# Patient Record
Sex: Male | Born: 1970 | Race: White | Hispanic: No | Marital: Married | Smoking: Current every day smoker
Health system: Southern US, Community
[De-identification: ages and names within clinical notes are randomized; demographics above are authoritative.]

## PROBLEM LIST (undated history)

## (undated) ENCOUNTER — Emergency Department (HOSPITAL_COMMUNITY): Discharge status: Against Medical Advice | Payer: Self-pay

## (undated) ENCOUNTER — Emergency Department (HOSPITAL_COMMUNITY): Disposition: A | Discharge status: Home / Self Care | Payer: Self-pay

## (undated) DIAGNOSIS — G629 Polyneuropathy, unspecified: Secondary | ICD-10-CM

## (undated) DIAGNOSIS — D649 Anemia, unspecified: Secondary | ICD-10-CM

## (undated) DIAGNOSIS — M1990 Unspecified osteoarthritis, unspecified site: Secondary | ICD-10-CM

## (undated) DIAGNOSIS — J189 Pneumonia, unspecified organism: Secondary | ICD-10-CM

## (undated) DIAGNOSIS — N419 Inflammatory disease of prostate, unspecified: Secondary | ICD-10-CM

## (undated) DIAGNOSIS — K219 Gastro-esophageal reflux disease without esophagitis: Secondary | ICD-10-CM

## (undated) HISTORY — PX: SHOULDER SURGERY: SHX246

## (undated) HISTORY — PX: TOE AMPUTATION: SHX809

---

## 1999-09-01 ENCOUNTER — Ambulatory Visit (HOSPITAL_COMMUNITY): Admission: RE | Admit: 1999-09-01 | Discharge: 1999-09-01 | Payer: Self-pay | Admitting: *Deleted

## 1999-09-01 ENCOUNTER — Encounter: Payer: Self-pay | Admitting: *Deleted

## 2009-08-12 ENCOUNTER — Emergency Department (HOSPITAL_COMMUNITY): Admission: EM | Admit: 2009-08-12 | Discharge: 2009-08-12 | Payer: Self-pay | Admitting: Emergency Medicine

## 2010-04-15 ENCOUNTER — Ambulatory Visit: Payer: Self-pay | Admitting: Diagnostic Radiology

## 2010-04-18 ENCOUNTER — Inpatient Hospital Stay (HOSPITAL_COMMUNITY): Admission: EM | Admit: 2010-04-18 | Discharge: 2010-04-22 | Payer: Self-pay | Admitting: Emergency Medicine

## 2010-04-20 ENCOUNTER — Ambulatory Visit: Payer: Self-pay | Admitting: Infectious Diseases

## 2010-11-24 ENCOUNTER — Emergency Department (HOSPITAL_BASED_OUTPATIENT_CLINIC_OR_DEPARTMENT_OTHER): Admission: EM | Admit: 2010-11-24 | Discharge: 2010-04-15 | Payer: Self-pay | Admitting: Emergency Medicine

## 2010-11-24 ENCOUNTER — Emergency Department (HOSPITAL_COMMUNITY): Admission: EM | Admit: 2010-11-24 | Discharge: 2010-07-10 | Payer: Self-pay | Admitting: Emergency Medicine

## 2011-03-03 ENCOUNTER — Emergency Department (HOSPITAL_COMMUNITY)
Admission: EM | Admit: 2011-03-03 | Discharge: 2011-03-03 | Discharge status: Against Medical Advice | Payer: Self-pay | Attending: Emergency Medicine | Admitting: Emergency Medicine

## 2011-03-03 DIAGNOSIS — L03019 Cellulitis of unspecified finger: Secondary | ICD-10-CM | POA: Insufficient documentation

## 2011-03-03 DIAGNOSIS — E789 Disorder of lipoprotein metabolism, unspecified: Secondary | ICD-10-CM | POA: Insufficient documentation

## 2011-03-03 DIAGNOSIS — E119 Type 2 diabetes mellitus without complications: Secondary | ICD-10-CM | POA: Insufficient documentation

## 2011-03-03 DIAGNOSIS — L02519 Cutaneous abscess of unspecified hand: Secondary | ICD-10-CM | POA: Insufficient documentation

## 2011-03-03 DIAGNOSIS — L989 Disorder of the skin and subcutaneous tissue, unspecified: Secondary | ICD-10-CM | POA: Insufficient documentation

## 2011-03-07 LAB — COMPREHENSIVE METABOLIC PANEL
ALT: 17 U/L (ref 0–53)
AST: 17 U/L (ref 0–37)
Albumin: 3 g/dL — ABNORMAL LOW (ref 3.5–5.2)
Alkaline Phosphatase: 84 U/L (ref 39–117)
BUN: 9 mg/dL (ref 6–23)
CO2: 25 mEq/L (ref 19–32)
CO2: 26 mEq/L (ref 19–32)
CO2: 29 mEq/L (ref 19–32)
Calcium: 8.9 mg/dL (ref 8.4–10.5)
Chloride: 101 mEq/L (ref 96–112)
Chloride: 106 mEq/L (ref 96–112)
Chloride: 97 mEq/L (ref 96–112)
Creatinine, Ser: 0.75 mg/dL (ref 0.4–1.5)
GFR calc Af Amer: >60 mL/min (ref >60)
GFR calc Af Amer: >60 mL/min (ref >60)
GFR calc non Af Amer: >60 mL/min (ref >60)
GFR calc non Af Amer: >60 mL/min (ref >60)
GFR calc non Af Amer: >60 mL/min (ref >60)
Glucose, Bld: 234 mg/dL — ABNORMAL HIGH (ref 70–99)
Potassium: 3.4 mEq/L — ABNORMAL LOW (ref 3.5–5.1)
Potassium: 3.5 mEq/L (ref 3.5–5.1)
Potassium: 3.7 mEq/L (ref 3.5–5.1)
Sodium: 131 mEq/L — ABNORMAL LOW (ref 135–145)
Sodium: 136 mEq/L (ref 135–145)
Total Bilirubin: 0.6 mg/dL (ref 0.3–1.2)
Total Bilirubin: 0.6 mg/dL (ref 0.3–1.2)
Total Protein: 7.8 g/dL (ref 6.0–8.3)

## 2011-03-07 LAB — CBC
HCT: 41.6 % (ref 39.0–52.0)
HCT: 47.3 % (ref 39.0–52.0)
Hemoglobin: 14.5 g/dL (ref 13.0–17.0)
Hemoglobin: 16.6 g/dL (ref 13.0–17.0)
MCHC: 35.2 g/dL (ref 30.0–36.0)
MCV: 89 fL (ref 78.0–100.0)
MCV: 89.9 fL (ref 78.0–100.0)
Platelets: 249 10*3/uL (ref 150–400)
Platelets: 254 10*3/uL (ref 150–400)
RBC: 4.62 MIL/uL (ref 4.22–5.81)
RBC: 5.16 MIL/uL (ref 4.22–5.81)
RDW: 11.7 % (ref 11.5–15.5)
WBC: 14.4 10*3/uL — ABNORMAL HIGH (ref 4.0–10.5)
WBC: 15.9 10*3/uL — ABNORMAL HIGH (ref 4.0–10.5)
WBC: 27.9 10*3/uL — ABNORMAL HIGH (ref 4.0–10.5)

## 2011-03-07 LAB — PATHOLOGIST SMEAR REVIEW

## 2011-03-07 LAB — DIFFERENTIAL
Basophils Absolute: 0.1 10*3/uL (ref 0.0–0.1)
Basophils Absolute: 0.1 10*3/uL (ref 0.0–0.1)
Basophils Relative: 1 % (ref 0–1)
Basophils Relative: 1 % (ref 0–1)
Basophils Relative: 1 % (ref 0–1)
Eosinophils Absolute: 0.3 10*3/uL (ref 0.0–0.7)
Eosinophils Absolute: 0.3 10*3/uL (ref 0.0–0.7)
Eosinophils Relative: 2 % (ref 0–5)
Eosinophils Relative: 2 % (ref 0–5)
Lymphocytes Relative: 15 % (ref 12–46)
Lymphocytes Relative: 28 % (ref 12–46)
Monocytes Absolute: 1.1 10*3/uL — ABNORMAL HIGH (ref 0.1–1.0)
Monocytes Absolute: 1.3 10*3/uL — ABNORMAL HIGH (ref 0.1–1.0)
Monocytes Absolute: 1.6 10*3/uL — ABNORMAL HIGH (ref 0.1–1.0)
Neutro Abs: 9.1 10*3/uL — ABNORMAL HIGH (ref 1.7–7.7)
Neutro Abs: 26.1 10*3/uL — ABNORMAL HIGH (ref 1.7–7.7)
Neutrophils Relative %: 61 % (ref 43–77)
Neutrophils Relative %: 63 % (ref 43–77)

## 2011-03-07 LAB — ANAEROBIC CULTURE: Gram Stain: NONE SEEN

## 2011-03-07 LAB — GLUCOSE, CAPILLARY
Glucose-Capillary: 171 mg/dL — ABNORMAL HIGH (ref 70–99)
Glucose-Capillary: 210 mg/dL — ABNORMAL HIGH (ref 70–99)
Glucose-Capillary: 222 mg/dL — ABNORMAL HIGH (ref 70–99)
Glucose-Capillary: 230 mg/dL — ABNORMAL HIGH (ref 70–99)
Glucose-Capillary: 230 mg/dL — ABNORMAL HIGH (ref 70–99)
Glucose-Capillary: 255 mg/dL — ABNORMAL HIGH (ref 70–99)
Glucose-Capillary: 271 mg/dL — ABNORMAL HIGH (ref 70–99)
Glucose-Capillary: 281 mg/dL — ABNORMAL HIGH (ref 70–99)
Glucose-Capillary: 490 mg/dL — ABNORMAL HIGH (ref 70–99)

## 2011-03-07 LAB — BASIC METABOLIC PANEL
BUN: 11 mg/dL (ref 6–23)
CO2: 26 mEq/L (ref 19–32)
Calcium: 8.6 mg/dL (ref 8.4–10.5)
Chloride: 101 mEq/L (ref 96–112)
Creatinine, Ser: 0.7 mg/dL (ref 0.4–1.5)
GFR calc Af Amer: >60 mL/min (ref >60)
GFR calc non Af Amer: >60 mL/min (ref >60)
Glucose, Bld: 242 mg/dL — ABNORMAL HIGH (ref 70–99)
Potassium: 4.1 mEq/L (ref 3.5–5.1)
Sodium: 138 mEq/L (ref 135–145)

## 2011-03-07 LAB — CULTURE, ROUTINE-ABSCESS

## 2011-03-07 LAB — CK TOTAL AND CKMB (NOT AT ARMC): CK, MB: 0.9 ng/mL (ref 0.3–4.0)

## 2011-03-07 LAB — CULTURE, BLOOD (ROUTINE X 2): Culture: NO GROWTH

## 2011-03-07 LAB — URINALYSIS, ROUTINE W REFLEX MICROSCOPIC
Leukocytes, UA: NEGATIVE
Protein, ur: NEGATIVE mg/dL
Specific Gravity, Urine: 1.028 (ref 1.005–1.030)
Urobilinogen, UA: 0.2 mg/dL (ref 0.0–1.0)

## 2011-03-07 LAB — TISSUE CULTURE

## 2011-03-07 LAB — C-REACTIVE PROTEIN: CRP: 6.2 mg/dL — ABNORMAL HIGH (ref <0.6)

## 2011-03-07 LAB — HEMOGLOBIN A1C: Mean Plasma Glucose: 301 mg/dL — ABNORMAL HIGH (ref <117)

## 2011-03-07 LAB — SEDIMENTATION RATE
Sed Rate: 30 mm/hr — ABNORMAL HIGH (ref 0–16)
Sed Rate: 37 mm/hr — ABNORMAL HIGH (ref 0–16)

## 2011-03-07 LAB — TROPONIN I: Troponin I: 0.01 ng/mL (ref 0.00–0.06)

## 2011-03-07 LAB — URINE MICROSCOPIC-ADD ON

## 2011-03-25 LAB — CULTURE, BLOOD (ROUTINE X 2)
Culture: NO GROWTH
Culture: NO GROWTH

## 2011-03-25 LAB — COMPREHENSIVE METABOLIC PANEL
ALT: 17 U/L (ref 0–53)
CO2: 28 mEq/L (ref 19–32)
Calcium: 9.1 mg/dL (ref 8.4–10.5)
Chloride: 96 mEq/L (ref 96–112)
GFR calc non Af Amer: >60 mL/min (ref >60)
Glucose, Bld: 276 mg/dL — ABNORMAL HIGH (ref 70–99)
Sodium: 135 mEq/L (ref 135–145)
Total Bilirubin: 0.6 mg/dL (ref 0.3–1.2)

## 2011-03-25 LAB — DIFFERENTIAL
Basophils Absolute: 0.1 10*3/uL (ref 0.0–0.1)
Basophils Relative: 1 % (ref 0–1)
Eosinophils Relative: 3 % (ref 0–5)
Monocytes Absolute: 1.2 10*3/uL — ABNORMAL HIGH (ref 0.1–1.0)

## 2011-03-25 LAB — CBC
Hemoglobin: 16 g/dL (ref 13.0–17.0)
MCHC: 34.9 g/dL (ref 30.0–36.0)
MCV: 88.2 fL (ref 78.0–100.0)
RBC: 5.21 MIL/uL (ref 4.22–5.81)

## 2011-06-07 ENCOUNTER — Emergency Department (HOSPITAL_COMMUNITY)
Admission: EM | Admit: 2011-06-07 | Discharge: 2011-06-08 | Disposition: A | Discharge status: Home / Self Care | Payer: Self-pay | Attending: Emergency Medicine | Admitting: Emergency Medicine

## 2011-06-07 DIAGNOSIS — L03319 Cellulitis of trunk, unspecified: Secondary | ICD-10-CM | POA: Insufficient documentation

## 2011-06-07 DIAGNOSIS — E119 Type 2 diabetes mellitus without complications: Secondary | ICD-10-CM | POA: Insufficient documentation

## 2011-06-07 DIAGNOSIS — L02219 Cutaneous abscess of trunk, unspecified: Secondary | ICD-10-CM | POA: Insufficient documentation

## 2011-06-08 LAB — GLUCOSE, CAPILLARY: Glucose-Capillary: 335 mg/dL — ABNORMAL HIGH (ref 70–99)

## 2011-12-15 ENCOUNTER — Emergency Department (HOSPITAL_COMMUNITY)
Admission: EM | Admit: 2011-12-15 | Discharge: 2011-12-15 | Disposition: A | Discharge status: Home / Self Care | Payer: Self-pay | Attending: Emergency Medicine | Admitting: Emergency Medicine

## 2011-12-15 ENCOUNTER — Encounter: Payer: Self-pay | Admitting: *Deleted

## 2011-12-15 DIAGNOSIS — T148XXA Other injury of unspecified body region, initial encounter: Secondary | ICD-10-CM

## 2011-12-15 DIAGNOSIS — X58XXXA Exposure to other specified factors, initial encounter: Secondary | ICD-10-CM | POA: Insufficient documentation

## 2011-12-15 DIAGNOSIS — E119 Type 2 diabetes mellitus without complications: Secondary | ICD-10-CM | POA: Insufficient documentation

## 2011-12-15 DIAGNOSIS — IMO0002 Reserved for concepts with insufficient information to code with codable children: Secondary | ICD-10-CM | POA: Insufficient documentation

## 2011-12-15 LAB — GLUCOSE, CAPILLARY: Glucose-Capillary: 243 mg/dL — ABNORMAL HIGH (ref 70–99)

## 2011-12-15 NOTE — ED Provider Notes (Signed)
History     CSN: [id number]  Arrival date & time 12/15/11  1744   First MD Initiated Contact with Patient 12/15/11 1958      Chief Complaint  Patient presents with  . Skin Ulcer    (Consider location/radiation/quality/duration/timing/severity/associated sxs/prior treatment) The history is provided by the patient.   seen here with blister to the right side of his foot which ruptured today. Some bloody drainage that has since resolved. No fever or red streaks going up his foot. History of diabetic foot. Nothing makes her symptoms better or worse  Past Medical History  Diagnosis Date  . Diabetes mellitus     History reviewed. No pertinent past surgical history.  History reviewed. No pertinent family history.  History  Substance Use Topics  . Smoking status: Current Everyday Smoker  . Smokeless tobacco: Not on file  . Alcohol Use: No      Review of Systems  All other systems reviewed and are negative.    Allergies  Review of patient's allergies indicates no known allergies.  Home Medications  No current outpatient prescriptions on file.  BP 115/98  Pulse 74  Temp(Src) 98 F (36.7 C) (Oral)  Resp 20  SpO2 99%  Physical Exam  Nursing note and vitals reviewed. Constitutional: He is oriented to person, place, and time. He appears well-developed and well-nourished.  Non-toxic appearance.  HENT:  Head: Normocephalic and atraumatic.  Eyes: Conjunctivae are normal. Pupils are equal, round, and reactive to light.  Neck: Normal range of motion.  Cardiovascular: Normal rate.   Pulmonary/Chest: Effort normal.  Musculoskeletal:       Feet:  Neurological: He is alert and oriented to person, place, and time.  Skin: Skin is warm and dry.  Psychiatric: He has a normal mood and affect.    ED Course  Procedures (including critical care time)  Labs Reviewed  GLUCOSE, CAPILLARY - Abnormal; Notable for the following:    Glucose-Capillary 243 (*)    All other  components within normal limits  POCT CBG MONITORING   No results found.   1. Blister       MDM  Patient given wound care structures        Toy Baker, MD 12/15/11 2036

## 2011-12-15 NOTE — ED Notes (Signed)
Pt left prior to receiving discharge instructions.

## 2011-12-15 NOTE — ED Notes (Addendum)
Pt in c/o sore to outside of right foot, first noted today, also increased pain in legs, pt is a noncompliant diabetic, unsure when he checked his glucose last, states he is supposed to be on insulin

## 2012-02-03 ENCOUNTER — Encounter (HOSPITAL_COMMUNITY): Payer: Self-pay | Admitting: *Deleted

## 2012-02-03 ENCOUNTER — Emergency Department (HOSPITAL_COMMUNITY)
Admission: EM | Admit: 2012-02-03 | Discharge: 2012-02-04 | Disposition: A | Discharge status: Home / Self Care | Payer: Self-pay | Attending: Emergency Medicine | Admitting: Emergency Medicine

## 2012-02-03 ENCOUNTER — Emergency Department (HOSPITAL_COMMUNITY): Payer: Self-pay

## 2012-02-03 DIAGNOSIS — N419 Inflammatory disease of prostate, unspecified: Secondary | ICD-10-CM | POA: Insufficient documentation

## 2012-02-03 DIAGNOSIS — F17200 Nicotine dependence, unspecified, uncomplicated: Secondary | ICD-10-CM | POA: Insufficient documentation

## 2012-02-03 DIAGNOSIS — Z794 Long term (current) use of insulin: Secondary | ICD-10-CM | POA: Insufficient documentation

## 2012-02-03 DIAGNOSIS — R5381 Other malaise: Secondary | ICD-10-CM | POA: Insufficient documentation

## 2012-02-03 DIAGNOSIS — R300 Dysuria: Secondary | ICD-10-CM | POA: Insufficient documentation

## 2012-02-03 DIAGNOSIS — R5383 Other fatigue: Secondary | ICD-10-CM | POA: Insufficient documentation

## 2012-02-03 DIAGNOSIS — R1032 Left lower quadrant pain: Secondary | ICD-10-CM | POA: Insufficient documentation

## 2012-02-03 DIAGNOSIS — E119 Type 2 diabetes mellitus without complications: Secondary | ICD-10-CM | POA: Insufficient documentation

## 2012-02-03 DIAGNOSIS — R739 Hyperglycemia, unspecified: Secondary | ICD-10-CM

## 2012-02-03 LAB — COMPREHENSIVE METABOLIC PANEL
Albumin: 3.4 g/dL — ABNORMAL LOW (ref 3.5–5.2)
Alkaline Phosphatase: 140 U/L — ABNORMAL HIGH (ref 39–117)
BUN: 13 mg/dL (ref 6–23)
Calcium: 9.9 mg/dL (ref 8.4–10.5)
Creatinine, Ser: 0.68 mg/dL (ref 0.50–1.35)
GFR calc Af Amer: >90 mL/min (ref >90)
Glucose, Bld: 387 mg/dL — ABNORMAL HIGH (ref 70–99)
Potassium: 3.8 mEq/L (ref 3.5–5.1)
Total Protein: 8.4 g/dL — ABNORMAL HIGH (ref 6.0–8.3)

## 2012-02-03 LAB — GLUCOSE, CAPILLARY: Glucose-Capillary: 445 mg/dL — ABNORMAL HIGH (ref 70–99)

## 2012-02-03 LAB — LIPASE, BLOOD: Lipase: 25 U/L (ref 11–59)

## 2012-02-03 LAB — BLOOD GAS, VENOUS
Patient temperature: 98.6
TCO2: 26.3 mmol/L (ref 0–100)
pCO2, Ven: 46.9 mmHg (ref 45.0–50.0)
pH, Ven: 7.429 — ABNORMAL HIGH (ref 7.250–7.300)

## 2012-02-03 LAB — CBC
Hemoglobin: 16.4 g/dL (ref 13.0–17.0)
MCH: 31.2 pg (ref 26.0–34.0)
MCHC: 36 g/dL (ref 30.0–36.0)
MCV: 86.7 fL (ref 78.0–100.0)

## 2012-02-03 LAB — KETONES, QUALITATIVE: Acetone, Bld: NEGATIVE

## 2012-02-03 LAB — DIFFERENTIAL
Basophils Relative: 0 % (ref 0–1)
Eosinophils Absolute: 0.3 10*3/uL (ref 0.0–0.7)
Eosinophils Relative: 2 % (ref 0–5)
Monocytes Relative: 8 % (ref 3–12)
Neutrophils Relative %: 76 % (ref 43–77)

## 2012-02-03 MED ORDER — ONDANSETRON HCL 4 MG/2ML IJ SOLN
4.0000 mg | Freq: Once | INTRAMUSCULAR | Status: AC
Start: 2012-02-03 — End: 2012-02-03
  Administered 2012-02-03: 4 mg via INTRAVENOUS
  Filled 2012-02-03: qty 2

## 2012-02-03 MED ORDER — HYDROMORPHONE HCL PF 1 MG/ML IJ SOLN
1.0000 mg | Freq: Once | INTRAMUSCULAR | Status: AC
  Administered 2012-02-03: 1 mg via INTRAVENOUS
  Filled 2012-02-03: qty 1

## 2012-02-03 MED ORDER — INSULIN ASPART 100 UNIT/ML ~~LOC~~ SOLN
10.0000 [IU] | Freq: Once | SUBCUTANEOUS | Status: AC
  Administered 2012-02-04: 10 [IU] via SUBCUTANEOUS
  Filled 2012-02-03: qty 1

## 2012-02-03 MED ORDER — SODIUM CHLORIDE 0.9 % IV BOLUS (SEPSIS)
1000.0000 mL | Freq: Once | INTRAVENOUS | Status: AC
  Administered 2012-02-03: 1000 mL via INTRAVENOUS

## 2012-02-03 MED ORDER — INSULIN REGULAR HUMAN 100 UNIT/ML IJ SOLN: 10.0000 [IU] | Freq: Once | INTRAMUSCULAR | Status: DC

## 2012-02-03 MED ORDER — ONDANSETRON HCL 4 MG/2ML IJ SOLN: 4.0000 mg | Freq: Once | INTRAMUSCULAR | Status: DC

## 2012-02-03 MED ORDER — KETOROLAC TROMETHAMINE 30 MG/ML IJ SOLN
30.0000 mg | Freq: Once | INTRAMUSCULAR | Status: AC
  Administered 2012-02-03: 30 mg via INTRAVENOUS
  Filled 2012-02-03: qty 1

## 2012-02-03 MED ORDER — SODIUM CHLORIDE 0.9 % IV BOLUS (SEPSIS)
1000.0000 mL | Freq: Once | INTRAVENOUS | Status: AC
  Administered 2012-02-04 (×2): 1000 mL via INTRAVENOUS

## 2012-02-03 NOTE — ED Provider Notes (Signed)
History     CSN: [id number]  Arrival date & time 02/03/12  2128   First MD Initiated Contact with Patient 02/03/12 2207      Chief Complaint  Patient presents with  . Flank Pain  . Nausea  . Emesis    (Consider location/radiation/quality/duration/timing/severity/associated sxs/prior treatment) HPI Comments: Noncompliant diabetic presenting with left flank pain intermittently for the past 3 days associated with nausea and vomiting. He states his sugars have been over 400 today his wife states he is not taking his insulin because it makes him feel worse. She states these began swelling approximately 5 times in the past 2 years. He denies any fever. History kidney stones. His left flank and radiates to his left groin and testicle. It is associated with dysuria and hematuria. Denies any chest pain, shortness of breath, headache. He has no previous history of DKA.  The history is provided by the patient.    Past Medical History  Diagnosis Date  . Diabetes mellitus     Past Surgical History  Procedure Date  . Toe amputation     multiple toes amputated.    History reviewed. No pertinent family history.  History  Substance Use Topics  . Smoking status: Current Everyday Smoker -- 2.0 packs/day    Types: Cigarettes  . Smokeless tobacco: Not on file  . Alcohol Use: No      Review of Systems  Constitutional: Positive for fatigue. Negative for fever, activity change and appetite change.  Respiratory: Negative for cough, chest tightness and shortness of breath.   Cardiovascular: Negative for chest pain.  Gastrointestinal: Positive for nausea, vomiting and abdominal pain.  Genitourinary: Positive for dysuria, flank pain and testicular pain. Negative for hematuria.  Musculoskeletal: Positive for back pain.  Skin: Negative for rash.  Neurological: Negative for tremors and headaches.  Hematological: Negative for adenopathy.    Allergies  Review of patient's allergies indicates  no known allergies.  Home Medications   Current Outpatient Rx  Name Route Sig Dispense Refill  . INSULIN ASPART 100 UNIT/ML [place] SOLN Subcutaneous Inject 1-100 Units into the skin 3 (three) times daily before meals.      BP 122/68  Pulse 101  Temp(Src) 99.4 F (37.4 C) (Oral)  Resp 17  Ht 6\' 1"  (1.854 m)  Wt 215 lb (97.523 kg)  BMI 28.37 kg/m2  SpO2 96%  Physical Exam  Constitutional: He is oriented to person, place, and time. He appears well-developed and well-nourished. No distress.  HENT:  Head: Normocephalic and atraumatic.  Mouth/Throat: Oropharynx is clear and moist. No oropharyngeal exudate.  Eyes: Conjunctivae are normal. Pupils are equal, round, and reactive to light.  Neck: Normal range of motion. Neck supple.  Cardiovascular: Normal rate, regular rhythm and normal heart sounds.   Pulmonary/Chest: Effort normal and breath sounds normal. No respiratory distress.  Abdominal: Soft. There is tenderness.       LLQ pain with guarding.  Musculoskeletal: He exhibits tenderness.       L CVAT   Neurological: He is alert and oriented to person, place, and time. No cranial nerve deficit.  Skin: Skin is warm.    ED Course  Procedures (including critical care time)  Labs Reviewed  GLUCOSE, CAPILLARY - Abnormal; Notable for the following:    Glucose-Capillary 445 (*)    All other components within normal limits  CBC - Abnormal; Notable for the following:    WBC 21.1 (*)    All other components within normal limits  DIFFERENTIAL -  Abnormal; Notable for the following:    Neutro Abs 16.1 (*)    Monocytes Absolute 1.7 (*)    All other components within normal limits  COMPREHENSIVE METABOLIC PANEL - Abnormal; Notable for the following:    Glucose, Bld 387 (*)    Total Protein 8.4 (*)    Albumin 3.4 (*)    Alkaline Phosphatase 140 (*)    Total Bilirubin 0.2 (*)    All other components within normal limits  URINALYSIS, ROUTINE W REFLEX MICROSCOPIC - Abnormal; Notable for  the following:    Specific Gravity, Urine 1.037 (*)    Glucose, UA >1000 (*)    Hgb urine dipstick MODERATE (*)    All other components within normal limits  BLOOD GAS, VENOUS - Abnormal; Notable for the following:    pH, Ven 7.429 (*)    pO2, Ven 23.7 (*)    Bicarbonate 30.5 (*)    Acid-Base Excess 5.5 (*)    All other components within normal limits  GLUCOSE, CAPILLARY - Abnormal; Notable for the following:    Glucose-Capillary 297 (*)    All other components within normal limits  LIPASE, BLOOD  KETONES, QUALITATIVE  URINE MICROSCOPIC-ADD ON   No results found.   No diagnosis found.    MDM  Flank pain, nausea, vomiting, hyperglycemia. Rule out DKA, rule out kidney stone   Anion gap 11, no evidence of DKA.  Hyperglycemia improving with IVF and insulin.  Leukocytosis on CBC but has been elevated in the past.  CT stone study and UA pending. Signed out to Dr. Hyacinth Meeker at change of shift.    Glynn Octave, MD 02/04/12 [id number]

## 2012-02-03 NOTE — ED Notes (Signed)
Pt is stating that he has pain in his abdomen that is oriented to the left and radiates to his lower abdomen and his left testicle.  Pt is also stating concern about a wound on his right foot that is on the lateral aspect of the bottom of his foot that is known to be infected and he expresses that he has not been taking his abx as prescribed.

## 2012-02-03 NOTE — ED Notes (Signed)
Pt c/o L flank pain x 3 days, intermittently. Pt c/o n/v x 2 today. Pt c/o pain radiating to L groin.

## 2012-02-04 LAB — URINE MICROSCOPIC-ADD ON

## 2012-02-04 LAB — URINALYSIS, ROUTINE W REFLEX MICROSCOPIC
Leukocytes, UA: NEGATIVE
Nitrite: NEGATIVE
Specific Gravity, Urine: 1.037 — ABNORMAL HIGH (ref 1.005–1.030)
Urobilinogen, UA: 0.2 mg/dL (ref 0.0–1.0)
pH: 6 (ref 5.0–8.0)

## 2012-02-04 LAB — GLUCOSE, CAPILLARY
Glucose-Capillary: 267 mg/dL — ABNORMAL HIGH (ref 70–99)
Glucose-Capillary: 297 mg/dL — ABNORMAL HIGH (ref 70–99)

## 2012-02-04 MED ORDER — METFORMIN HCL 500 MG PO TABS: 500.0000 mg | ORAL_TABLET | Freq: Two times a day (BID) | ORAL | Status: DC

## 2012-02-04 MED ORDER — CIPROFLOXACIN IN D5W 400 MG/200ML IV SOLN
400.0000 mg | Freq: Once | INTRAVENOUS | Status: AC
  Administered 2012-02-04: 400 mg via INTRAVENOUS
  Filled 2012-02-04: qty 200

## 2012-02-04 MED ORDER — SODIUM CHLORIDE 0.9 % IV BOLUS (SEPSIS): 1000.0000 mL | Freq: Once | INTRAVENOUS | Status: DC

## 2012-02-04 MED ORDER — CIPROFLOXACIN HCL 500 MG PO TABS: 500.0000 mg | ORAL_TABLET | Freq: Two times a day (BID) | ORAL | Status: AC

## 2012-02-04 MED ORDER — GLYBURIDE 5 MG PO TABS: 5.0000 mg | ORAL_TABLET | Freq: Two times a day (BID) | ORAL | Status: DC

## 2012-02-04 NOTE — ED Provider Notes (Signed)
  Physical Exam  BP 122/68  Pulse 101  Temp(Src) 99.4 F (37.4 C) (Oral)  Resp 17  Ht 6\' 1"  (1.854 m)  Wt 215 lb (97.523 kg)  BMI 28.37 kg/m2  SpO2 96%  Physical Exam  ED Course  Procedures  MDM  L flank pain going to groin - hx of non compliant diabetic, no AG acidosis, WBC 21,000, CBG 445, fluids and insuling given, stone study pending, toradol and hydromorphone for pain.  Patient reevaluated, tachycardia has resolved, blood sugar improved, CT scan showing enlarged prostate, urine with signs of infection and white blood cell count elevated, all pointing to prostatitis. Antibiotics given intravenously  Patient reevaluated, prostate exam shows enlarged prostate, boggy, minimally tender. Discussed with patient regarding urology followup. Repeat blood sugar 267, vital signs have normalized, patient is amenable to discharge. Will refer to local followup in [facility] for help with medications, restart oral medications until time when he can get insulin filled. I have encouraged him to follow up very closely he has expressed his understanding and is agreeable to plan.  Vida Roller, MD 02/04/12 [phone]

## 2012-02-04 NOTE — ED Notes (Signed)
cbg 297 

## 2012-02-19 ENCOUNTER — Encounter (HOSPITAL_BASED_OUTPATIENT_CLINIC_OR_DEPARTMENT_OTHER): Payer: Self-pay

## 2012-02-23 ENCOUNTER — Encounter (HOSPITAL_COMMUNITY): Payer: Self-pay | Admitting: Emergency Medicine

## 2012-02-23 ENCOUNTER — Emergency Department (HOSPITAL_COMMUNITY)
Admission: EM | Admit: 2012-02-23 | Discharge: 2012-02-24 | Disposition: A | Discharge status: Home / Self Care | Payer: Medicaid Other | Attending: Emergency Medicine | Admitting: Emergency Medicine

## 2012-02-23 ENCOUNTER — Emergency Department (HOSPITAL_COMMUNITY): Payer: Medicaid Other

## 2012-02-23 DIAGNOSIS — E11621 Type 2 diabetes mellitus with foot ulcer: Secondary | ICD-10-CM

## 2012-02-23 DIAGNOSIS — M79609 Pain in unspecified limb: Secondary | ICD-10-CM | POA: Insufficient documentation

## 2012-02-23 DIAGNOSIS — F411 Generalized anxiety disorder: Secondary | ICD-10-CM | POA: Insufficient documentation

## 2012-02-23 DIAGNOSIS — F17200 Nicotine dependence, unspecified, uncomplicated: Secondary | ICD-10-CM | POA: Insufficient documentation

## 2012-02-23 DIAGNOSIS — Z794 Long term (current) use of insulin: Secondary | ICD-10-CM | POA: Insufficient documentation

## 2012-02-23 DIAGNOSIS — R4589 Other symptoms and signs involving emotional state: Secondary | ICD-10-CM | POA: Insufficient documentation

## 2012-02-23 DIAGNOSIS — L97509 Non-pressure chronic ulcer of other part of unspecified foot with unspecified severity: Secondary | ICD-10-CM | POA: Insufficient documentation

## 2012-02-23 DIAGNOSIS — E1169 Type 2 diabetes mellitus with other specified complication: Secondary | ICD-10-CM | POA: Insufficient documentation

## 2012-02-23 LAB — POCT I-STAT, CHEM 8
Chloride: 104 mEq/L (ref 96–112)
Creatinine, Ser: 0.5 mg/dL (ref 0.50–1.35)
Hemoglobin: 15.3 g/dL (ref 13.0–17.0)
Potassium: 3.6 mEq/L (ref 3.5–5.1)
Sodium: 142 mEq/L (ref 135–145)

## 2012-02-23 LAB — DIFFERENTIAL
Eosinophils Absolute: 0.5 10*3/uL (ref 0.0–0.7)
Lymphocytes Relative: 27 % (ref 12–46)
Lymphs Abs: 4.6 10*3/uL — ABNORMAL HIGH (ref 0.7–4.0)
Neutro Abs: 10.3 10*3/uL — ABNORMAL HIGH (ref 1.7–7.7)
Neutrophils Relative %: 61 % (ref 43–77)

## 2012-02-23 LAB — CBC
Hemoglobin: 15.2 g/dL (ref 13.0–17.0)
MCH: 29.9 pg (ref 26.0–34.0)
Platelets: 368 10*3/uL (ref 150–400)
RBC: 5.09 MIL/uL (ref 4.22–5.81)
WBC: 16.9 10*3/uL — ABNORMAL HIGH (ref 4.0–10.5)

## 2012-02-23 MED ORDER — HYDROCODONE-ACETAMINOPHEN 5-325 MG PO TABS
1.0000 | ORAL_TABLET | Freq: Once | ORAL | Status: AC
  Administered 2012-02-23: 1 via ORAL
  Filled 2012-02-23: qty 1

## 2012-02-23 MED ORDER — SODIUM CHLORIDE 0.9 % IV SOLN
3.0000 g | Freq: Once | INTRAVENOUS | Status: AC
  Administered 2012-02-23: 3 g via INTRAVENOUS
  Filled 2012-02-23: qty 3

## 2012-02-23 MED ORDER — OXYCODONE-ACETAMINOPHEN 5-325 MG PO TABS
2.0000 | ORAL_TABLET | Freq: Once | ORAL | Status: AC
  Administered 2012-02-23: 2 via ORAL
  Filled 2012-02-23: qty 2

## 2012-02-23 NOTE — ED Notes (Signed)
Pt transported by wheelchair by RN to [facility] for UA

## 2012-02-23 NOTE — ED Notes (Signed)
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 Started on Cipro 500mg  BID for "couple of weeks". Pt reports he is limiting ambulation; pt ambulated into ED in normal tennis shoes with no socks. Pt reports that he feels that foot is getting worse, no visible signs of redness or swelling are present however. Pt education about wound care, offloading, the importance of compliance with diabetic foot boot emphasized with pt.

## 2012-02-23 NOTE — ED Notes (Signed)
Patient transported to CT 

## 2012-02-23 NOTE — ED Notes (Signed)
Family at bedside. 

## 2012-02-23 NOTE — ED Provider Notes (Signed)
History     CSN: [id number]  Arrival date & time 02/23/12  2012   First MD Initiated Contact with Patient 02/23/12 2119      Chief Complaint  Patient presents with  . Wound Infection  . Foot Pain    (Consider location/radiation/quality/duration/timing/severity/associated sxs/prior treatment) Patient is a 41 y.o. male presenting with lower extremity pain. The history is provided by the patient. No language interpreter was used.  Foot Pain This is a recurrent problem. The current episode started in the past 7 days. The problem occurs constantly. Pertinent negatives include no abdominal pain, chills, diaphoresis, fever, joint swelling, nausea or vomiting. The symptoms are aggravated by walking.   Reports that he has a right foot ulcer x 3 week is getting worse. States that his PCP put him on Cipro x2 weeks. Patient is afraid that he can lose his foot. States that his foot is swollen and red. When compared to 2 feet but does not look swollen. It may be slightly reddened. He reports no fever but chills.  He is slightly tachycardic in the ER today.  States he is unable to pay for his medications including his insulin. He does not have a glucometer to see what his sugars are running. Patient is wearing tennis shoes with socks. A dressing on the ulcer. He is also supposed to be wearing a foot boot but is noncompliant with that. No drainage noted from the ulcer site.  Also has a 1cm ulcer to the plantar aspect of the big toe x 7 days.  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 He is on diabeta, glucophage but has no been taking his novolog ac because he can not afford it.    Past Medical History  Diagnosis Date  . Diabetes mellitus     Past Surgical History  Procedure Date  . Toe amputation     multiple toes amputated.    No family history on file.  History  Substance Use Topics  . Smoking status: Current Everyday Smoker -- 2.0 packs/day    Types: Cigarettes  . Smokeless  tobacco: Not on file  . Alcohol Use: No      Review of Systems  Constitutional: Negative for fever, chills and diaphoresis.  Gastrointestinal: Negative for nausea, vomiting and abdominal pain.  Musculoskeletal: Negative for joint swelling.  All other systems reviewed and are negative.    Allergies  Review of patient's allergies indicates no known allergies.  Home Medications   Current Outpatient Rx  Name Route Sig Dispense Refill  . CIPROFLOXACIN HCL 500 MG PO TABS Oral Take 500 mg by mouth 2 (two) times daily.    . GLYBURIDE 5 MG PO TABS Oral Take 1 tablet (5 mg total) by mouth 2 (two) times daily with a meal. 60 tablet 0  . METFORMIN HCL 500 MG PO TABS Oral Take 1 tablet (500 mg total) by mouth 2 (two) times daily with a meal. 60 tablet 1  . INSULIN ASPART 100 UNIT/ML  SOLN Subcutaneous Inject 1-100 Units into the skin 3 (three) times daily before meals.      BP 156/88  Pulse 104  Temp(Src) 98 F (36.7 C) (Oral)  Resp 18  SpO2 98%  Physical Exam  Nursing note and vitals reviewed. Constitutional: He is oriented to person, place, and time. He appears well-developed and well-nourished.  HENT:  Head: Normocephalic and atraumatic.  Eyes: Pupils are equal, round, and reactive to light.  Neck: Neck supple.  Cardiovascular: Normal  rate and regular rhythm.  Exam reveals no gallop and no friction rub.   No murmur heard. Pulmonary/Chest: Breath sounds normal. No respiratory distress.  Abdominal: Soft. He exhibits no distension.  Musculoskeletal: Normal range of motion. He exhibits tenderness. He exhibits no edema.       3cm ulser to the plantar aspect of the R foot.  Neurological: He is alert and oriented to person, place, and time. No cranial nerve deficit.  Skin: Skin is warm and dry.  Psychiatric: Judgment and thought content normal. His mood appears anxious. His affect is angry. He is aggressive. Cognition and memory are normal.    ED Course  Procedures (including  critical care time)   Labs Reviewed  CBC  DIFFERENTIAL   No results found.   No diagnosis found.    MDM   40yo with R diabetic foot ulcer to the plantar aspect of his foot 3cm wide and 2cm to bottom of great toe.  CT without contrast recommends CT withcontrast or MRI.  Radiologist recommends MRI tomorrow.  Received a 3gm of Unisyn in the ER tonight. Afebrile. No cellulitis or dainage from the wound.  Set up a call for social work/case manager to get funds for insulin and a glucose testing kit.  Pt is taking his metformin and glyburide.  Continue cipro. Will call results of the MRI when complete and arrange appropriate follow up.  WBC 16.9, Glucose 186.   Labs Reviewed  CBC - Abnormal; Notable for the following:    WBC 16.9 (*)    All other components within normal limits  DIFFERENTIAL - Abnormal; Notable for the following:    Neutro Abs 10.3 (*)    Lymphs Abs 4.6 (*)    Monocytes Absolute 1.4 (*)    All other components within normal limits  POCT I-STAT, CHEM 8 - Abnormal; Notable for the following:    Glucose, Bld 186 (*)    All other components within normal limits  URINALYSIS, ROUTINE W REFLEX MICROSCOPIC - Abnormal; Notable for the following:    Glucose, UA >1000 (*)    Hgb urine dipstick SMALL (*)    Protein, ur 30 (*)    Leukocytes, UA TRACE (*)    All other components within normal limits  URINE MICROSCOPIC-ADD ON  LAB REPORT - SCANNED         Jethro Bastos, NP 02/24/12 1100

## 2012-02-23 NOTE — ED Notes (Signed)
Pt reports inability to pay for medications, reports non-compliance due to financial hardship with glucose testing. Support offered, family angry at suggestion of CSW consult, says "we aren't freaking idiots!!!" Listening provided.

## 2012-02-24 LAB — URINALYSIS, ROUTINE W REFLEX MICROSCOPIC
Nitrite: NEGATIVE
Specific Gravity, Urine: 1.029 (ref 1.005–1.030)
Urobilinogen, UA: 0.2 mg/dL (ref 0.0–1.0)
pH: 5.5 (ref 5.0–8.0)

## 2012-02-24 LAB — URINE MICROSCOPIC-ADD ON

## 2012-02-24 MED ORDER — HYDROCODONE-ACETAMINOPHEN 7.5-750 MG PO TABS: 1.0000 | ORAL_TABLET | Freq: Four times a day (QID) | ORAL | Status: DC | PRN

## 2012-02-24 NOTE — Discharge Instructions (Signed)
Continue the cipro.  Return tomorrow at  For the MRI of your foot to rule out osteomyelitis.  Continue your metformin.  The medication will be held after the test.   Call [phone] of you do not hear anything by 9am.  The mri will be done at [facility].  Diabetes and Foot Care Diabetes may cause you to have a poor blood supply (circulation) to your legs and feet. Because of this, the skin may be thinner, break easier, and heal more slowly. You also may have nerve damage in your legs and feet causing decreased feeling. You may not notice minor injuries to your feet that could lead to serious problems or infections. Taking care of your feet is one of the most important things you can do for yourself.  HOME CARE INSTRUCTIONS  Do not go barefoot. Bare feet are easily injured.   Check your feet daily for blisters, cuts, and redness.   Wash your feet with warm water (not hot) and mild soap. Pat your feet and between your toes until completely dry.   Apply a moisturizing lotion that does not contain alcohol or petroleum jelly to the dry skin on your feet and to dry brittle toenails. Do not put it between your toes.   Trim your toenails straight across. Do not dig under them or around the cuticle.   Do not cut corns or calluses, or try to remove them with medicine.   Wear clean cotton socks or stockings every day. Make sure they are not too tight. Do not wear knee high stockings since they may decrease blood flow to your legs.   Wear leather shoes that fit properly and have enough cushioning. To break in new shoes, wear them just a few hours a day to avoid injuring your feet.   Wear shoes at all times, even in the house.   Do not cross your legs. This may decrease the blood flow to your feet.   If you find a minor scrape, cut, or break in the skin on your feet, keep it and the skin around it clean and dry. These areas may be cleansed with mild soap and water. Do not use peroxide, alcohol, iodine or  Merthiolate.   When you remove an adhesive bandage, be sure not to harm the skin around it.   If you have a wound, look at it several times a day to make sure it is healing.   Do not use heating pads or hot water bottles. Burns can occur. If you have lost feeling in your feet or legs, you may not know it is happening until it is too late.   Report any cuts, sores or bruises to your caregiver. Do not wait!  SEEK MEDICAL CARE IF:   You have an injury that is not healing or you notice redness, numbness, burning, or tingling.   Your feet always feel cold.   You have pain or cramps in your legs and feet.  SEEK IMMEDIATE MEDICAL CARE IF:   There is increasing redness, swelling, or increasing pain in the wound.   There is a red line that goes up your leg.   Pus is coming from a wound.   You develop an unexplained oral temperature above 102 F (38.9 C), or as your caregiver suggests.   You notice a bad smell coming from an ulcer or wound.  MAKE SURE YOU:   Understand these instructions.   Will watch your condition.   Will get  help right away if you are not doing well or get worse.  Document Released: 12/01/2000 Document Revised: 11/23/2011 Document Reviewed: 06/09/2009 [facility] Patient Information 2012 [place], [place].Diabetes and Foot Care Diabetes may cause you to have a poor blood supply (circulation) to your legs and feet. Because of this, the skin may be thinner, break easier, and heal more slowly. You also may have nerve damage in your legs and feet causing decreased feeling. You may not notice minor injuries to your feet that could lead to serious problems or infections. Taking care of your feet is one of the most important things you can do for yourself.  HOME CARE INSTRUCTIONS  Do not go barefoot. Bare feet are easily injured.   Check your feet daily for blisters, cuts, and redness.   Wash your feet with warm water (not hot) and mild soap. Pat your feet and between your  toes until completely dry.   Apply a moisturizing lotion that does not contain alcohol or petroleum jelly to the dry skin on your feet and to dry brittle toenails. Do not put it between your toes.   Trim your toenails straight across. Do not dig under them or around the cuticle.   Do not cut corns or calluses, or try to remove them with medicine.   Wear clean cotton socks or stockings every day. Make sure they are not too tight. Do not wear knee high stockings since they may decrease blood flow to your legs.   Wear leather shoes that fit properly and have enough cushioning. To break in new shoes, wear them just a few hours a day to avoid injuring your feet.   Wear shoes at all times, even in the house.   Do not cross your legs. This may decrease the blood flow to your feet.   If you find a minor scrape, cut, or break in the skin on your feet, keep it and the skin around it clean and dry. These areas may be cleansed with mild soap and water. Do not use peroxide, alcohol, iodine or Merthiolate.   When you remove an adhesive bandage, be sure not to harm the skin around it.   If you have a wound, look at it several times a day to make sure it is healing.   Do not use heating pads or hot water bottles. Burns can occur. If you have lost feeling in your feet or legs, you may not know it is happening until it is too late.   Report any cuts, sores or bruises to your caregiver. Do not wait!  SEEK MEDICAL CARE IF:   You have an injury that is not healing or you notice redness, numbness, burning, or tingling.   Your feet always feel cold.   You have pain or cramps in your legs and feet.  SEEK IMMEDIATE MEDICAL CARE IF:   There is increasing redness, swelling, or increasing pain in the wound.   There is a red line that goes up your leg.   Pus is coming from a wound.   You develop an unexplained oral temperature above 102 F (38.9 C), or as your caregiver suggests.   You notice a bad  smell coming from an ulcer or wound.  MAKE SURE YOU:   Understand these instructions.   Will watch your condition.   Will get help right away if you are not doing well or get worse.  Document Released: 12/01/2000 Document Revised: 11/23/2011 Document Reviewed: 06/09/2009 [employer] Patient Information  [address] [place].

## 2012-02-24 NOTE — ED Provider Notes (Signed)
Medical screening examination/treatment/procedure(s) were conducted as a shared visit with non-physician practitioner(s) and myself.  I personally evaluated the patient during the encounter  Chronic R foot ulcer. No drainage. +ttp. Worsening pain per patient. On abx.  Elevated WBC, decreased from previous (3wks ago). CT wo ordered by PA without osteo. Recommend MRI as outpatient. Discussed with PA- will order outpatient MRI  Forbes Cellar, MD 02/24/12 1616

## 2012-02-24 NOTE — ED Notes (Signed)
Pt called this am upset due to not being able to obtain appointment to have MRI done, spoke with Kem Parkinson and told her the person he spoke with prior to her hung up on him, told her he will call back in 5 min if no one calls him, message given to Tijuan Dantes RN CN. In reviewing the chart, pt was instructed to call [facility] at 9 am if no one has called him. Pt states, they don't know anything about it and could not get it scheduled. RN spoke with Ebbie Ridge PA and Dr Clarene Duke, order written for pt to come to [facility]. Spoke with MRI and was told to ask the pt to come ASAP. In speaking with Mr Lacko he states "I cannot come today, I have plans" RN stressed the concern regarding the reason for the MRI and urgency in dx. Pt states; "They didn't care last night" RN explained she can only help look out for his best interest today regarding his health. Pt still states he is not coming but can come tomorrow after 1pm or Monday. RN spoke with Lea in MRI again, she request pt to come at 2pm with the understanding there may be a wait. Pt notified of this message, pt pleased and understands to come to registration at [facility] and pick up order for test. RN then notified [facility] of the acceptance of the 2 pm appointment.

## 2012-02-25 ENCOUNTER — Inpatient Hospital Stay (HOSPITAL_COMMUNITY)
Admission: EM | Admit: 2012-02-25 | Discharge: 2012-02-26 | DRG: 638 | Discharge status: Against Medical Advice | Payer: Medicaid Other | Attending: Internal Medicine | Admitting: Internal Medicine

## 2012-02-25 ENCOUNTER — Other Ambulatory Visit (HOSPITAL_COMMUNITY): Payer: Self-pay | Admitting: Emergency Medicine

## 2012-02-25 ENCOUNTER — Encounter (HOSPITAL_COMMUNITY): Payer: Self-pay | Admitting: *Deleted

## 2012-02-25 ENCOUNTER — Ambulatory Visit (HOSPITAL_COMMUNITY)
Admission: RE | Admit: 2012-02-25 | Discharge: 2012-02-25 | Disposition: A | Discharge status: Home / Self Care | Payer: Medicaid Other | Source: Ambulatory Visit | Attending: Emergency Medicine | Admitting: Emergency Medicine

## 2012-02-25 DIAGNOSIS — S98139A Complete traumatic amputation of one unspecified lesser toe, initial encounter: Secondary | ICD-10-CM

## 2012-02-25 DIAGNOSIS — L97509 Non-pressure chronic ulcer of other part of unspecified foot with unspecified severity: Secondary | ICD-10-CM | POA: Diagnosis present

## 2012-02-25 DIAGNOSIS — M869 Osteomyelitis, unspecified: Secondary | ICD-10-CM

## 2012-02-25 DIAGNOSIS — E1169 Type 2 diabetes mellitus with other specified complication: Principal | ICD-10-CM | POA: Diagnosis present

## 2012-02-25 DIAGNOSIS — L03119 Cellulitis of unspecified part of limb: Secondary | ICD-10-CM | POA: Insufficient documentation

## 2012-02-25 DIAGNOSIS — L0390 Cellulitis, unspecified: Secondary | ICD-10-CM | POA: Diagnosis present

## 2012-02-25 DIAGNOSIS — Z794 Long term (current) use of insulin: Secondary | ICD-10-CM

## 2012-02-25 DIAGNOSIS — L02619 Cutaneous abscess of unspecified foot: Secondary | ICD-10-CM | POA: Diagnosis present

## 2012-02-25 DIAGNOSIS — S91309A Unspecified open wound, unspecified foot, initial encounter: Secondary | ICD-10-CM | POA: Insufficient documentation

## 2012-02-25 DIAGNOSIS — D72829 Elevated white blood cell count, unspecified: Secondary | ICD-10-CM | POA: Diagnosis present

## 2012-02-25 DIAGNOSIS — E119 Type 2 diabetes mellitus without complications: Secondary | ICD-10-CM | POA: Insufficient documentation

## 2012-02-25 DIAGNOSIS — IMO0001 Reserved for inherently not codable concepts without codable children: Secondary | ICD-10-CM | POA: Insufficient documentation

## 2012-02-25 DIAGNOSIS — F17200 Nicotine dependence, unspecified, uncomplicated: Secondary | ICD-10-CM | POA: Diagnosis present

## 2012-02-25 DIAGNOSIS — X58XXXA Exposure to other specified factors, initial encounter: Secondary | ICD-10-CM | POA: Insufficient documentation

## 2012-02-25 DIAGNOSIS — E118 Type 2 diabetes mellitus with unspecified complications: Secondary | ICD-10-CM | POA: Diagnosis present

## 2012-02-25 DIAGNOSIS — IMO0002 Reserved for concepts with insufficient information to code with codable children: Principal | ICD-10-CM | POA: Diagnosis present

## 2012-02-25 DIAGNOSIS — E11621 Type 2 diabetes mellitus with foot ulcer: Secondary | ICD-10-CM | POA: Diagnosis present

## 2012-02-25 LAB — CBC
HCT: 45 % (ref 39.0–52.0)
Hemoglobin: 16.2 g/dL (ref 13.0–17.0)
MCH: 31.1 pg (ref 26.0–34.0)
MCHC: 36 g/dL (ref 30.0–36.0)
MCV: 86.4 fL (ref 78.0–100.0)
Platelets: 345 10*3/uL (ref 150–400)
RBC: 5.21 MIL/uL (ref 4.22–5.81)
RDW: 12.8 % (ref 11.5–15.5)
WBC: 18.7 10*3/uL — ABNORMAL HIGH (ref 4.0–10.5)

## 2012-02-25 LAB — URINE MICROSCOPIC-ADD ON

## 2012-02-25 LAB — DIFFERENTIAL
Basophils Absolute: 0.1 10*3/uL (ref 0.0–0.1)
Basophils Relative: 1 % (ref 0–1)
Eosinophils Absolute: 0.5 10*3/uL (ref 0.0–0.7)
Eosinophils Relative: 3 % (ref 0–5)
Lymphocytes Relative: 25 % (ref 12–46)
Lymphs Abs: 4.6 10*3/uL — ABNORMAL HIGH (ref 0.7–4.0)
Monocytes Absolute: 1.3 10*3/uL — ABNORMAL HIGH (ref 0.1–1.0)
Monocytes Relative: 7 % (ref 3–12)
Neutro Abs: 12.1 10*3/uL — ABNORMAL HIGH (ref 1.7–7.7)
Neutrophils Relative %: 65 % (ref 43–77)

## 2012-02-25 LAB — URINALYSIS, ROUTINE W REFLEX MICROSCOPIC
Bilirubin Urine: NEGATIVE
Glucose, UA: 250 mg/dL — AB
Ketones, ur: 15 mg/dL — AB
Nitrite: NEGATIVE
Protein, ur: 100 mg/dL — AB
Specific Gravity, Urine: 1.03 (ref 1.005–1.030)
Urobilinogen, UA: 0.2 mg/dL (ref 0.0–1.0)
pH: 6 (ref 5.0–8.0)

## 2012-02-25 LAB — POCT I-STAT, CHEM 8
BUN: 16 mg/dL (ref 6–23)
Calcium, Ion: 1.18 mmol/L (ref 1.12–1.32)
Chloride: 100 meq/L (ref 96–112)
Creatinine, Ser: 0.8 mg/dL (ref 0.50–1.35)
Glucose, Bld: 154 mg/dL — ABNORMAL HIGH (ref 70–99)
HCT: 49 % (ref 39.0–52.0)
Hemoglobin: 16.7 g/dL (ref 13.0–17.0)
Potassium: 3.6 meq/L (ref 3.5–5.1)
Sodium: 139 meq/L (ref 135–145)
TCO2: 29 mmol/L (ref 0–100)

## 2012-02-25 MED ORDER — PIPERACILLIN SOD-TAZOBACTAM SO 2.25 (2-0.25) G IV SOLR
3.3750 g | Freq: Once | INTRAVENOUS | Status: AC
  Administered 2012-02-25: 3.375 g via INTRAVENOUS
  Filled 2012-02-25: qty 3.38

## 2012-02-25 MED ORDER — LEVOFLOXACIN IN D5W 500 MG/100ML IV SOLN
500.0000 mg | INTRAVENOUS | Status: DC
  Filled 2012-02-25: qty 100

## 2012-02-25 MED ORDER — INSULIN ASPART 100 UNIT/ML ~~LOC~~ SOLN: 0.0000 [IU] | Freq: Every day | SUBCUTANEOUS | Status: DC

## 2012-02-25 MED ORDER — SODIUM CHLORIDE 0.9 % IV SOLN: INTRAVENOUS | Status: DC

## 2012-02-25 MED ORDER — VANCOMYCIN HCL IN DEXTROSE 1-5 GM/200ML-% IV SOLN
1000.0000 mg | Freq: Once | INTRAVENOUS | Status: AC
  Administered 2012-02-25: 1000 mg via INTRAVENOUS
  Filled 2012-02-25: qty 200

## 2012-02-25 MED ORDER — HYDROCODONE-ACETAMINOPHEN 5-325 MG PO TABS
1.0000 | ORAL_TABLET | Freq: Once | ORAL | Status: AC
  Administered 2012-02-25: 1 via ORAL
  Filled 2012-02-25: qty 1

## 2012-02-25 MED ORDER — GADOBENATE DIMEGLUMINE 529 MG/ML IV SOLN: 20.0000 mL | Freq: Once | INTRAVENOUS | Status: DC | PRN

## 2012-02-25 MED ORDER — INSULIN GLARGINE 100 UNIT/ML ~~LOC~~ SOLN: 10.0000 [IU] | Freq: Every day | SUBCUTANEOUS | Status: DC

## 2012-02-25 MED ORDER — INSULIN ASPART 100 UNIT/ML ~~LOC~~ SOLN: 0.0000 [IU] | Freq: Three times a day (TID) | SUBCUTANEOUS | Status: DC

## 2012-02-25 MED ORDER — SODIUM CHLORIDE 0.9 % IV BOLUS (SEPSIS)
1000.0000 mL | Freq: Once | INTRAVENOUS | Status: AC
  Administered 2012-02-25: 1000 mL via INTRAVENOUS

## 2012-02-25 NOTE — H&P (Signed)
PCP:   No primary provider on file.   Chief Complaint:  Foot pain and unable to bear weight on the right foot .  HPI: 41 year old gentle man with h/o poorly controlled Type 2 DM, came in for worsening right foot pain. He has been to ED multiple times over the last few weeks for similar complaints, underwent CT of the foot, got IV antibiotics  And got discharged with oral antibiotics. Today he comes in for worsening right foot pain, and unable to bear weight on the right foot. The ulcers started about a month ago and they have not healed so far. He is also a diabetic, with out insurance, having difficulty getting insulin. He also reports transient blindness about 3 months ago,. He reports chills and night sweats. He had an MRI of the foot showing surrounding cellulitis and myositis. He is admitted to hospitalist service for management of diabetic foot ulcer with surrounding cellulitis.    Review of Systems:  The patient denies anorexia,  weight loss,, vision loss, decreased hearing, hoarseness, chest pain, syncope, dyspnea on exertion, peripheral edema, balance deficits, hemoptysis, abdominal pain, melena, hematochezia, severe indigestion/heartburn, hematuria, incontinence, genital sores, muscle weakness, suspicious skin lesions,   depression, unusual weight change, abnormal bleeding, enlarged lymph nodes, angioedema, and breast masses.  Past Medical History: Past Medical History  Diagnosis Date  . Diabetes mellitus    Past Surgical History  Procedure Date  . Toe amputation     multiple toes amputated.    Medications: Prior to Admission medications   Medication Sig Start Date End Date Taking? Authorizing Provider  ciprofloxacin (CIPRO) 500 MG tablet Take 500 mg by mouth 2 (two) times daily.   Yes Historical Provider, MD  glyBURIDE (DIABETA) 5 MG tablet Take 1 tablet (5 mg total) by mouth 2 (two) times daily with a meal. 02/04/12 02/03/13 Yes Vida Roller, MD  metFORMIN (GLUCOPHAGE) 500 MG  tablet Take 1 tablet (500 mg total) by mouth 2 (two) times daily with a meal. 02/04/12 02/03/13 Yes Vida Roller, MD    Allergies:  No Known Allergies  Social History:  reports that he has been smoking Cigarettes.  He has been smoking about 2 packs per day. He does not have any smokeless tobacco history on file. He reports that he does not drink alcohol or use illicit drugs.   Family History: History reviewed. No pertinent family history.  Physical Exam: Filed Vitals:   02/25/12 1823  BP: 121/75  Pulse: 100  Temp: 98.3 F (36.8 C)  Resp: 16  SpO2: 100%   Constitutional: Vital signs reviewed.  Patient is a well-developed and well-nourished  in no acute distress and cooperative with exam. Alert and oriented x3.  Head: Normocephalic and atraumatic Mouth: no erythema or exudates, MMM Eyes: PERRL, EOMI, conjunctivae normal, No scleral icterus.  Neck: Supple, Trachea midline normal ROM, No JVD, mass, thyromegaly, or carotid bruit present.  Cardiovascular: RRR, S1 normal, S2 normal, no MRG, pulses symmetric and intact bilaterally Pulmonary/Chest: CTAB, no wheezes, rales, or rhonchi Abdominal: Soft. Non-tender, non-distended, bowel sounds are normal, no masses, organomegaly, or guarding present.  Musculoskeletal: Two plantar ulcers on the right foot, one at the base of the great toe measuring about 2 cm and one on the plantar aspect of the right foot. No active drainage from the ulcers. No pedal edema. Tenderness present.  Neurological: A&O x3, Strenght is normal and symmetric bilaterally, cranial nerve II-XII are grossly intact, no focal motor deficit, sensory intact  to light touch bilaterally.  Skin: Warm, dry and intact. No rash, cyanosis, or clubbing.  Psychiatric: Normal mood and affect. speech and behavior is normal. Judgment and thought content normal. Cognition and memory are normal.      Labs on Admission:   [facility] 02/25/12 2120 02/23/12 2208  NA 139 142  K 3.6 3.6  CL  100 104  CO2 -- --  GLUCOSE 154* 186*  BUN 16 12  CREATININE 0.80 0.50  CALCIUM -- --  MG -- --  PHOS -- --   No results found for this basename: AST:2,ALT:2,ALKPHOS:2,BILITOT:2,PROT:2,ALBUMIN:2 in the last 72 hours No results found for this basename: LIPASE:2,AMYLASE:2 in the last 72 hours  Basename 02/25/12 2120 02/25/12 2015 02/23/12 2200  WBC -- 18.7* 16.9*  NEUTROABS -- 12.1* 10.3*  HGB 16.7 16.2 --  HCT 49.0 45.0 --  MCV -- 86.4 86.8  PLT -- 345 368   No results found for this basename: CKTOTAL:3,CKMB:3,CKMBINDEX:3,TROPONINI:3 in the last 72 hours No results found for this basename: TSH,T4TOTAL,FREET3,T3FREE,THYROIDAB in the last 72 hours No results found for this basename: VITAMINB12:2,FOLATE:2,FERRITIN:2,TIBC:2,IRON:2,RETICCTPCT:2 in the last 72 hours  Radiological Exams on Admission: Ct Abdomen Pelvis Wo Contrast  02/04/2012  *RADIOLOGY REPORT*  Clinical Data: Left-sided abdominal pain, radiating to the left testicle.  CT ABDOMEN AND PELVIS WITHOUT CONTRAST  Technique:  Multidetector CT imaging of the abdomen and pelvis was performed following the standard protocol without intravenous contrast.  Comparison: None.  Findings: Mild bibasilar atelectasis is noted.  The liver and spleen are unremarkable in appearance.  The gallbladder is within normal limits.  The pancreas and adrenal glands are unremarkable.  Mild nonspecific perinephric stranding is noted bilaterally, slightly more prominent at the upper pole of the left kidney. There is an associated focus of decreased attenuation at the upper pole of the left kidney; mild focal pyelonephritis cannot be excluded.  There is no evidence of hydronephrosis.  No renal or ureteral stones are identified.  Minimal increased attenuation at the anterior aspect of the left kidney could reflect a small hyperdense cyst, but is nonspecific in appearance.  No free fluid is identified.  The small bowel is unremarkable in appearance.  The stomach is  within normal limits.  No acute vascular abnormalities are seen.  Scattered calcification is noted along the distal abdominal aorta and its branches, mildly advanced for age.  A circumaortic left renal vein is noted.  The appendix is normal in caliber and contains air, without evidence for appendicitis.  A single diverticulum is noted along the descending colon.  The colon is unremarkable in appearance.  The bladder is moderately distended and grossly unremarkable in appearance.  The prostate is enlarged and difficult to fully define, with suggestion of adjacent soft tissue inflammation; it measures 5.7 cm in size.  No inguinal lymphadenopathy is seen.  No acute osseous abnormalities are identified.  IMPRESSION:  1.  No evidence of hydronephrosis; no renal or ureteral stones seen. 2.  Focus of decreased attenuation at the upper pole of the left kidney, with mild nonspecific perinephric stranding noted bilaterally, slightly more prominent at the upper pole of the left kidney.  Mild focal pyelonephritis cannot be excluded.  3.  Enlarged prostate, measuring 5.7 cm; this is difficult to fully define, with suggestion of associated soft tissue inflammation. Suggest clinical correlation for symptoms of prostatitis. 4.  Scattered calcification along the distal abdominal aorta and its branches, mildly advanced for age; circumaortic left renal vein noted.  Original Report Authenticated By:  JEFFREY Cherly Hensen, M.D.   Ct Foot Right Wo Contrast  02/23/2012  *RADIOLOGY REPORT*  Clinical Data: Pain associated with a diabetic ulcer at the base of the right fifth toe.  CT OF THE RIGHT FOOT WITHOUT CONTRAST  Technique:  Multidetector CT imaging was performed according to the standard protocol. Multiplanar CT image reconstructions were also generated.  Comparison: Previous examinations, including the right foot radiographs obtained earlier today.  Findings: The examination is limited by the lack of intravenous contrast.  There is soft  tissue swelling ventral to the fifth MTP joint and fifth metatarsal head.  No gross fluid collection.  No soft tissue gas.  No bone destruction or periosteal reaction.  IMPRESSION:  1.  Limited examination for the evaluation of abscess, due to the lack of intravenous contrast. 2.  Soft tissue swelling ventral to the fifth MTP joint and fifth metatarsal head without soft tissue gas, bone destruction or periosteal reaction.  If there is a clinical concern for osteomyelitis, pre and postcontrast magnetic resonance imaging of the foot would be recommended.  Original Report Authenticated By: Darrol Angel, M.D.   Mr Foot Right W Wo Contrast  02/25/2012  *RADIOLOGY REPORT*  Clinical Data: Diabetic with open wound in the lateral plantar forefoot region  MRI OF THE RIGHT FOREFOOT WITHOUT AND WITH CONTRAST  Technique:  Multiplanar, multisequence MR imaging was performed both before and after administration of intravenous contrast.  Contrast:  20 ml Multihance  Comparison: CT scan 02/23/2012.  Findings: There is an open wound on the plantar aspect of the foot near the fifth metatarsal phalangeal joint.  There is focal cellulitis and underlying myositis but no focal drainable soft tissue abscess or evidence of septic arthritis or osteomyelitis.  IMPRESSION:  1.  Cellulitis and myositis. 2.  No focal abscess, septic arthritis or osteomyelitis.  Original Report Authenticated By: P. Loralie Champagne, M.D.   Dg Foot Complete Right  02/23/2012  *RADIOLOGY REPORT*  Clinical Data: Diabetic ulcer, base of the fifth digit.  RIGHT FOOT COMPLETE - 3+ VIEW  Comparison: 04/18/2010 radiograph  Findings: Interval amputation of the fourth digit at the PIP joint. Osteopenia.  While radiograph has limited sensitivity for osteomyelitis, no destructive osseous lesion is identified.  There is soft tissue swelling and gas overlying the fifth digit, near the PIP joint.  Plantar and posterior calcaneal enthesopathic changes. Mild midfoot DJD.   IMPRESSION: Soft tissue swelling and and small amount of gas overlies the fifth digit near the PIP joint.  Note that radiograph has limited sensitivity for osteomyelitis detection (and in fact was only visualized by MRI in the patient's fourth digit previously).  Original Report Authenticated By: Waneta Martins, M.D.    Assessment/Plan Present on Admission:  Cellulitis/Diabetic toe ulcers: started the patient on vancomycin and Levaquin. Mri of the foot shows cellulitis and myositis, no osteomyelitis.  Pain control. Wound consult.  .Diabetes mellitus; HBGA1C, SSI and lantus 10 units at bedtime. Need case management to see if he can get insulin till he gets Disability.  Leukocytosis: probably related to cellulitis and diabetic foot ulcers. DVT prophylaxis: Lovenox PT Consult.  Full code  Time spent on this patient including examination and decision-making process: 65 minutes.  Tuvia Woodrick [phone] 02/25/2012, 11:13 PM

## 2012-02-25 NOTE — ED Provider Notes (Signed)
Medical screening examination/treatment/procedure(s) were performed by non-physician practitioner and as supervising physician I was immediately available for consultation/collaboration.   Nat Christen, MD 02/25/12 [phone]

## 2012-02-25 NOTE — ED Provider Notes (Signed)
History     CSN: [id number]  Arrival date & time 02/25/12  9147   First MD Initiated Contact with Patient 02/25/12 1844      Chief Complaint  Patient presents with  . Cellulitis    (Consider location/radiation/quality/duration/timing/severity/associated sxs/prior treatment) Patient is a 41 y.o. male presenting with lower extremity pain. The history is provided by the patient. No language interpreter was used.  Foot Pain This is a chronic problem. The current episode started more than 1 month ago. The problem occurs daily. Pertinent negatives include no chest pain, chills, diaphoresis, fever, headaches, joint swelling, nausea, numbness or vomiting. The symptoms are aggravated by walking. He has tried oral narcotics for the symptoms. The treatment provided moderate relief.   patient returns today for his chronic foot ulcer that started over a month ago. He's been on Cipro for 3 weeks. He has got of IV Unasyn in the ER 2 days ago. He had MRI today which shows cellulitis and myositis in the foot. . Patient is a brittle diabetic with no PCP or insulin supplies. He has been referred to case management to help him with his supplies. We advised him to return after his MRI because we think he should be admitted for IV antibiotics. Patient has had multiple toes amputated for the same in the past. The ulcer is on the plantar aspect of his fluids measuring 3 cm. He also has a 2 cm ulcer on the plantar aspect of his great on the left as well. White count 2 days ago was 16.9. His heart rate is tachycardia at 100-105.  Refused to come back yesterday for MRI.  Difficult patient aggressive and angry at times.  Unahappy with his care.    Past Medical History  Diagnosis Date  . Diabetes mellitus     Past Surgical History  Procedure Date  . Toe amputation     multiple toes amputated.    No family history on file.  History  Substance Use Topics  . Smoking status: Current Everyday Smoker -- 2.0  packs/day    Types: Cigarettes  . Smokeless tobacco: Not on file  . Alcohol Use: No      Review of Systems  Constitutional: Negative for fever, chills and diaphoresis.  Cardiovascular: Negative for chest pain.  Gastrointestinal: Negative for nausea and vomiting.  Musculoskeletal: Negative for joint swelling.  Neurological: Negative for numbness and headaches.  All other systems reviewed and are negative.    Allergies  Review of patient's allergies indicates no known allergies.  Home Medications   Current Outpatient Rx  Name Route Sig Dispense Refill  . CIPROFLOXACIN HCL 500 MG PO TABS Oral Take 500 mg by mouth 2 (two) times daily.    . GLYBURIDE 5 MG PO TABS Oral Take 1 tablet (5 mg total) by mouth 2 (two) times daily with a meal. 60 tablet 0  . METFORMIN HCL 500 MG PO TABS Oral Take 1 tablet (500 mg total) by mouth 2 (two) times daily with a meal. 60 tablet 1    BP 121/75  Pulse 100  Temp 98.3 F (36.8 C)  Resp 16  SpO2 100%  Physical Exam  Nursing note and vitals reviewed. Constitutional: He is oriented to person, place, and time. He appears well-developed and well-nourished.  HENT:  Head: Normocephalic and atraumatic.  Eyes: Pupils are equal, round, and reactive to light.  Neck: Neck supple.  Cardiovascular: Normal rate and regular rhythm.   Pulmonary/Chest: Effort normal and breath sounds normal.  Abdominal: Soft. He exhibits no distension.  Musculoskeletal: Normal range of motion. He exhibits tenderness. He exhibits no edema.       R foot diabetic ulser with tenderness to pad of foot and Great R toe  Neurological: He is alert and oriented to person, place, and time. No cranial nerve deficit.  Skin: Skin is warm and dry.       3cm diabetic ulser to bottom of L foot x 2 erythema, tenderness with no drainage.  Psychiatric: He has a normal mood and affect.    ED Course  Procedures (including critical care time)   Labs Reviewed  CBC  DIFFERENTIAL    URINALYSIS, ROUTINE W REFLEX MICROSCOPIC   Ct Foot Right Wo Contrast  02/23/2012  *RADIOLOGY REPORT*  Clinical Data: Pain associated with a diabetic ulcer at the base of the right fifth toe.  CT OF THE RIGHT FOOT WITHOUT CONTRAST  Technique:  Multidetector CT imaging was performed according to the standard protocol. Multiplanar CT image reconstructions were also generated.  Comparison: Previous examinations, including the right foot radiographs obtained earlier today.  Findings: The examination is limited by the lack of intravenous contrast.  There is soft tissue swelling ventral to the fifth MTP joint and fifth metatarsal head.  No gross fluid collection.  No soft tissue gas.  No bone destruction or periosteal reaction.  IMPRESSION:  1.  Limited examination for the evaluation of abscess, due to the lack of intravenous contrast. 2.  Soft tissue swelling ventral to the fifth MTP joint and fifth metatarsal head without soft tissue gas, bone destruction or periosteal reaction.  If there is a clinical concern for osteomyelitis, pre and postcontrast magnetic resonance imaging of the foot would be recommended.  Original Report Authenticated By: Darrol Angel, M.D.   Mr Foot Right W Wo Contrast  02/25/2012  *RADIOLOGY REPORT*  Clinical Data: Diabetic with open wound in the lateral plantar forefoot region  MRI OF THE RIGHT FOREFOOT WITHOUT AND WITH CONTRAST  Technique:  Multiplanar, multisequence MR imaging was performed both before and after administration of intravenous contrast.  Contrast:  20 ml Multihance  Comparison: CT scan 02/23/2012.  Findings: There is an open wound on the plantar aspect of the foot near the fifth metatarsal phalangeal joint.  There is focal cellulitis and underlying myositis but no focal drainable soft tissue abscess or evidence of septic arthritis or osteomyelitis.  IMPRESSION:  1.  Cellulitis and myositis. 2.  No focal abscess, septic arthritis or osteomyelitis.  Original Report  Authenticated By: P. Loralie Champagne, M.D.   Dg Foot Complete Right  02/23/2012  *RADIOLOGY REPORT*  Clinical Data: Diabetic ulcer, base of the fifth digit.  RIGHT FOOT COMPLETE - 3+ VIEW  Comparison: 04/18/2010 radiograph  Findings: Interval amputation of the fourth digit at the PIP joint. Osteopenia.  While radiograph has limited sensitivity for osteomyelitis, no destructive osseous lesion is identified.  There is soft tissue swelling and gas overlying the fifth digit, near the PIP joint.  Plantar and posterior calcaneal enthesopathic changes. Mild midfoot DJD.  IMPRESSION: Soft tissue swelling and and small amount of gas overlies the fifth digit near the PIP joint.  Note that radiograph has limited sensitivity for osteomyelitis detection (and in fact was only visualized by MRI in the patient's fourth digit previously).  Original Report Authenticated By: Waneta Martins, M.D.     No diagnosis found.    MDM  Patient will be admitted to the hospitalist team for a MedSurg bed  for diabetic foot ulcer. MRI shows cellulitis in myositis the plantar aspect of his left foot about 3 cm in size. There is a second ulcer to the great 10. White blood cell count is 18.7 and his numerous white cells in his urine as well. He will be started on maintenance dosing. This has been a difficult case patient has been difficult to get to work with. Unsatisfied with the timing and the leading he had to do for his MRI. Social work and Sports coach have been notified 2 days ago to see this patient and get diabetes supplies at home. She also needs an orange card he has no insurance.   Labs Reviewed  CBC - Abnormal; Notable for the following:    WBC 18.7 (*)    All other components within normal limits  DIFFERENTIAL - Abnormal; Notable for the following:    Neutro Abs 12.1 (*)    Lymphs Abs 4.6 (*)    Monocytes Absolute 1.3 (*)    All other components within normal limits  URINALYSIS, ROUTINE W REFLEX MICROSCOPIC -  Abnormal; Notable for the following:    APPearance HAZY (*)    Glucose, UA 250 (*)    Hgb urine dipstick SMALL (*)    Ketones, ur 15 (*)    Protein, ur 100 (*)    Leukocytes, UA TRACE (*)    All other components within normal limits  POCT I-STAT, CHEM 8 - Abnormal; Notable for the following:    Glucose, Bld 154 (*)    All other components within normal limits  URINE MICROSCOPIC-ADD ON - Abnormal; Notable for the following:    Crystals CA OXALATE CRYSTALS (*)    All other components within normal limits  URINE CULTURE           Jethro Bastos, NP 02/25/12 2229

## 2012-02-25 NOTE — ED Notes (Signed)
Pt reports that he went to his foot doctor for a diabetic ulcer check on his right foot and MD stated that pt had cellulitis in his foot and prescribed Cipro and Bactrim. Pt states he completed the abx and did not get better. Pt came to the ER Friday night for same complaint and was given ampicillin IV and was sent for an MRI Sunday afternoon. Pt was told to come back to the ER to be admitted for the cellulitis.

## 2012-02-26 MED ORDER — VANCOMYCIN HCL IN DEXTROSE 1-5 GM/200ML-% IV SOLN
1000.0000 mg | Freq: Three times a day (TID) | INTRAVENOUS | Status: DC
  Filled 2012-02-26 (×2): qty 200

## 2012-02-26 MED ORDER — SODIUM CHLORIDE 0.9 % IV SOLN: 1000.0000 mg | Freq: Three times a day (TID) | INTRAVENOUS | Status: DC

## 2012-02-26 MED ORDER — ACETAMINOPHEN 325 MG PO TABS: 650.0000 mg | ORAL_TABLET | Freq: Four times a day (QID) | ORAL | Status: DC | PRN

## 2012-02-26 MED ORDER — VANCOMYCIN HCL 1000 MG IV SOLR
Freq: Three times a day (TID) | INTRAVENOUS | Status: DC
  Administered 2012-02-26: 06:00:00 via INTRAVENOUS
  Filled 2012-02-26 (×2): qty 250

## 2012-02-26 MED ORDER — ONDANSETRON HCL 4 MG PO TABS: 4.0000 mg | ORAL_TABLET | Freq: Four times a day (QID) | ORAL | Status: DC | PRN

## 2012-02-26 MED ORDER — ACETAMINOPHEN 650 MG RE SUPP: 650.0000 mg | Freq: Four times a day (QID) | RECTAL | Status: DC | PRN

## 2012-02-26 MED ORDER — ENOXAPARIN SODIUM 40 MG/0.4ML ~~LOC~~ SOLN
40.0000 mg | SUBCUTANEOUS | Status: DC
  Filled 2012-02-26: qty 0.4

## 2012-02-26 MED ORDER — ONDANSETRON HCL 4 MG/2ML IJ SOLN: 4.0000 mg | Freq: Four times a day (QID) | INTRAMUSCULAR | Status: DC | PRN

## 2012-02-26 MED ORDER — SENNOSIDES-DOCUSATE SODIUM 8.6-50 MG PO TABS
1.0000 | ORAL_TABLET | Freq: Every evening | ORAL | Status: DC | PRN
  Filled 2012-02-26: qty 1

## 2012-02-26 MED ORDER — OXYCODONE HCL 5 MG PO TABS: 5.0000 mg | ORAL_TABLET | ORAL | Status: DC | PRN

## 2012-02-26 MED ORDER — LEVOFLOXACIN IN D5W 500 MG/100ML IV SOLN
500.0000 mg | INTRAVENOUS | Status: DC
  Administered 2012-02-26: 500 mg via INTRAVENOUS
  Filled 2012-02-26: qty 100

## 2012-02-26 NOTE — Progress Notes (Signed)
ANTIBIOTIC CONSULT NOTE - INITIAL  Pharmacy Consult for vancomycin Indication: Diabetic foot ulcer   No Known Allergies  Patient Measurements: Height: 6' (182.9 cm) Weight: 220 lb 4.8 oz (99.927 kg) IBW/kg (Calculated) : 77.6  Adjusted Body Weight:   Vital Signs: Temp: 98.4 F (36.9 C) (03/11 0107) Temp src: Oral (03/11 0107) BP: 126/82 mmHg (03/11 0107) Pulse Rate: 97  (03/11 0107) Intake/Output from previous day:   Intake/Output from this shift:    Labs:  Basename 02/25/12 2120 02/25/12 2015 02/23/12 2208 02/23/12 2200  WBC -- 18.7* -- 16.9*  HGB 16.7 16.2 15.3 --  PLT -- 345 -- 368  LABCREA -- -- -- --  CREATININE 0.80 -- 0.50 --   Estimated Creatinine Clearance: 151.7 ml/min (by C-G formula based on Cr of 0.8). No results found for this basename: VANCOTROUGH:2,VANCOPEAK:2,VANCORANDOM:2,GENTTROUGH:2,GENTPEAK:2,GENTRANDOM:2,TOBRATROUGH:2,TOBRAPEAK:2,TOBRARND:2,AMIKACINPEAK:2,AMIKACINTROU:2,AMIKACIN:2, in the last 72 hours   Microbiology: No results found for this or any previous visit (from the past 720 hour(s)).  Medical History: Past Medical History  Diagnosis Date  . Diabetes mellitus     Medications:  Anti-infectives     Start     Dose/Rate Route Frequency Ordered Stop   02/26/12 1000   levofloxacin (LEVAQUIN) IVPB 500 mg  Status:  Discontinued        500 mg 100 mL/hr over 60 Minutes Intravenous Every 24 hours 02/25/12 2306 02/26/12 0100   02/26/12 0600   levofloxacin (LEVAQUIN) IVPB 500 mg        500 mg 100 mL/hr over 60 Minutes Intravenous Every 24 hours 02/26/12 0100     02/26/12 0600   vancomycin (VANCOCIN) IVPB 1000 mg/200 mL premix        1,000 mg 200 mL/hr over 60 Minutes Intravenous Every 8 hours 02/26/12 0117     02/25/12 2215  piperacillin-tazobactam (ZOSYN) 3.375 g in dextrose 5 % 50 mL IVPB       3.375 g 100 mL/hr over 30 Minutes Intravenous  Once 02/25/12 2201 02/25/12 2352   02/25/12 2145   vancomycin (VANCOCIN) IVPB 1000 mg/200 mL  premix        1,000 mg 200 mL/hr over 60 Minutes Intravenous  Once 02/25/12 2139 02/25/12 2307         Assessment: Patient with foot ulcer.  First dose of antibiotics already given in ED.   Goal of Therapy:  Vancomycin trough level 15-20 mcg/ml  Plan:  Measure antibiotic drug levels at steady state Follow up culture results Vancomycin 1gm iv q8hr  Darlina Guys, Rosemarie Galvis Crowford 02/26/2012,1:17 AM

## 2012-02-26 NOTE — Discharge Summary (Signed)
   This is a note for Mr. Derrick [name] discharge.    I did not see [name].  I received a call from his nurse that patient wants to go home AMA.  Patient does have right foot cellulitis/diabetic foot, UTI and uncontrolled diabetes mellitus type 2.  Patient understand that he does not want to stay in the hospital.  Clint Lipps Pager: [phone] 02/26/2012, 10:47 AM

## 2012-02-27 LAB — URINE CULTURE
Colony Count: NO GROWTH
Culture: NO GROWTH

## 2012-03-06 MED ORDER — GADOBENATE DIMEGLUMINE 529 MG/ML IV SOLN
20.0000 mL | Freq: Once | INTRAVENOUS | Status: AC | PRN
  Administered 2012-03-06: 20 mL via INTRAVENOUS

## 2012-03-07 ENCOUNTER — Encounter (HOSPITAL_BASED_OUTPATIENT_CLINIC_OR_DEPARTMENT_OTHER): Payer: PRIVATE HEALTH INSURANCE | Attending: Internal Medicine

## 2012-03-18 ENCOUNTER — Encounter (HOSPITAL_BASED_OUTPATIENT_CLINIC_OR_DEPARTMENT_OTHER): Payer: Self-pay

## 2012-03-20 ENCOUNTER — Encounter (HOSPITAL_BASED_OUTPATIENT_CLINIC_OR_DEPARTMENT_OTHER): Payer: Medicaid Other | Attending: Plastic Surgery

## 2012-03-20 DIAGNOSIS — E1169 Type 2 diabetes mellitus with other specified complication: Secondary | ICD-10-CM | POA: Insufficient documentation

## 2012-03-20 DIAGNOSIS — L97509 Non-pressure chronic ulcer of other part of unspecified foot with unspecified severity: Secondary | ICD-10-CM | POA: Insufficient documentation

## 2012-03-21 ENCOUNTER — Encounter (HOSPITAL_BASED_OUTPATIENT_CLINIC_OR_DEPARTMENT_OTHER): Payer: Self-pay

## 2012-03-21 NOTE — Progress Notes (Signed)
[facility]  NAME:  Derrick [name], Derrick [name]              ACCOUNT NO.:  [id number]  MEDICAL RECORD NO.:  [id number]      DATE OF BIRTH:  August 28, 1971  PHYSICIAN:  Wayland Denis, DO       VISIT DATE:  03/20/2012                                  OFFICE VISIT   CHIEF COMPLAINT:  Diabetic foot ulcer.  HISTORY OF PRESENT ILLNESS:  The patient is a 41 year old white male who has pretty severe diabetes.  He was recently hospitalized for a right lower extremity infection.  His wound was treated with IV antibiotics and local care.  He does see a podiatrist for this and he has been dealing with this wound for 2 months now.  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  MEDICATIONS:  He is currently on Cipro, glyburide, and Glucophage.  ALLERGIES:  No known drug allergies.  SOCIAL HISTORY:  He lives at home.  He is a smoker and married.  REVIEW OF SYSTEMS:  Otherwise negative.  PHYSICAL EXAMINATION:  GENERAL:  He is alert, oriented, cooperative, not in any acute distress.  He is pleasant. HEENT:  Pupils equal.  Extraocular muscles are intact. NECK:  No cervical lymphadenopathy. LUNGS:  His breathing is unlabored. HEART:  Regular. ABDOMEN:  Soft. SKIN:  The wound is noted in the notes and described above on the plantar aspect of the right lower extremity at the base of the fifth toe.  His 3rd toe is quite red as well which it was infected from him pulling his nail.  The wound does not appear to be infected.  ASSESSMENT AND PLAN:  We went ahead with debridement.  Recommend Santyl and re-evaluation next week.     Wayland Denis, DO    CS/MEDQ  D:  03/20/2012  T:  03/21/2012  Job:  [id number]

## 2012-04-04 ENCOUNTER — Encounter (HOSPITAL_BASED_OUTPATIENT_CLINIC_OR_DEPARTMENT_OTHER): Payer: Self-pay

## 2012-04-04 NOTE — Progress Notes (Signed)
[facility]  NAME:  Derrick [name], Derrick [name]                   ACCOUNT NO.:  MEDICAL RECORD NO.:  [id number]      DATE OF BIRTH:  22-May-1971  PHYSICIAN:  Wayland Denis, DO       VISIT DATE:  04/03/2012                                  OFFICE VISIT   The patient is a 41 year old gentleman with a diabetic Wagner 3 foot ulcer.  He has been using Santyl and Hydrogel on the area.  He says that it is done last week with a dressing in place.  He has tried to stay off his foot and has had some improvement.  No change in medications or social history.  EXAM:  GENERAL: He is alert, oriented, cooperative, not in any acute distress.  He is pleasant. EYES: His pupils are equal.  Extraocular muscles are intact. RESPIRATORY: His breathing is unlabored. CARDIOVASCULAR: Heart is regular. ABDOMEN:  Soft. SKIN: The wound has some improvement but it was debrided today.  Notes are noted in the chart.  We will hold on the Santyl and see if that helps decrease the discomfort and instead use collagen.  Strongly recommended that he not smoke, continue with a multivitamin and elevation and will see him back in a week.     Wayland Denis, DO     CS/MEDQ  D:  04/03/2012  T:  04/03/2012  Job:  [id number]

## 2012-04-05 ENCOUNTER — Emergency Department (HOSPITAL_COMMUNITY): Payer: PRIVATE HEALTH INSURANCE

## 2012-04-05 ENCOUNTER — Encounter (HOSPITAL_COMMUNITY): Payer: Self-pay | Admitting: Emergency Medicine

## 2012-04-05 ENCOUNTER — Emergency Department (HOSPITAL_COMMUNITY)
Admission: EM | Admit: 2012-04-05 | Discharge: 2012-04-05 | Disposition: A | Discharge status: Home / Self Care | Payer: PRIVATE HEALTH INSURANCE | Attending: Emergency Medicine | Admitting: Emergency Medicine

## 2012-04-05 DIAGNOSIS — E119 Type 2 diabetes mellitus without complications: Secondary | ICD-10-CM | POA: Insufficient documentation

## 2012-04-05 DIAGNOSIS — M779 Enthesopathy, unspecified: Secondary | ICD-10-CM | POA: Insufficient documentation

## 2012-04-05 DIAGNOSIS — M25519 Pain in unspecified shoulder: Secondary | ICD-10-CM | POA: Insufficient documentation

## 2012-04-05 MED ORDER — MELOXICAM 15 MG PO TABS: 15.0000 mg | ORAL_TABLET | Freq: Every day | ORAL | Status: DC

## 2012-04-05 MED ORDER — OXYCODONE-ACETAMINOPHEN 5-325 MG PO TABS: 2.0000 | ORAL_TABLET | ORAL | Status: AC | PRN

## 2012-04-05 MED ORDER — IBUPROFEN 800 MG PO TABS
800.0000 mg | ORAL_TABLET | Freq: Once | ORAL | Status: AC
  Administered 2012-04-05: 800 mg via ORAL
  Filled 2012-04-05: qty 1

## 2012-04-05 MED ORDER — OXYCODONE-ACETAMINOPHEN 5-325 MG PO TABS
1.0000 | ORAL_TABLET | Freq: Once | ORAL | Status: DC
  Filled 2012-04-05: qty 1

## 2012-04-05 NOTE — ED Notes (Signed)
Ortho coming to place sling

## 2012-04-05 NOTE — ED Notes (Signed)
Patient wanted to leave without sling.

## 2012-04-05 NOTE — Discharge Instructions (Signed)
Mr Derrick [name] take anti-inflammatory for pain. Take Percocet for severe pain do not drive with this medication. Followup with your PCP the pain. We can also recommend orthopedic doctor if your not better in a week. The number is below.

## 2012-04-05 NOTE — ED Provider Notes (Signed)
History     CSN: [id number]  Arrival date & time 04/05/12  1546   First MD Initiated Contact with Patient 04/05/12 1650      Chief Complaint  Patient presents with  . Shoulder Pain    (Consider location/radiation/quality/duration/timing/severity/associated sxs/prior treatment) Patient is a 41 y.o. male presenting with shoulder pain. The history is provided by the patient. No language interpreter was used.  Shoulder Pain This is a chronic problem. The current episode started 1 to 4 weeks ago. The problem occurs daily. The problem has been gradually worsening. Pertinent negatives include no abdominal pain, chest pain, chills, fever, joint swelling, nausea, neck pain, vomiting or weakness.   Patient reports left shoulder pain that is worsening x2 weeks. Report pain is mostly with abduction and external rotation. No pain with internal rotation. No deformity or swelling of the joint. Pain is palpable to the posterior shoulder. No fever skin cool to touch. Has taken nothing for pain. Past medical history of diabetes with amputated toes. Recently hospitalized for cellulitis.   Past Medical History  Diagnosis Date  . Diabetes mellitus     Past Surgical History  Procedure Date  . Toe amputation     multiple toes amputated.    No family history on file.  History  Substance Use Topics  . Smoking status: Current Everyday Smoker -- 2.0 packs/day    Types: Cigarettes  . Smokeless tobacco: Not on file  . Alcohol Use: No      Review of Systems  Constitutional: Negative.  Negative for fever and chills.  HENT: Negative.  Negative for neck pain.   Eyes: Negative.   Respiratory: Negative.   Cardiovascular: Negative.  Negative for chest pain.  Gastrointestinal: Negative.  Negative for nausea, vomiting and abdominal pain.  Musculoskeletal: Negative for joint swelling.  Neurological: Negative.  Negative for weakness.  Psychiatric/Behavioral: Negative.   All other systems reviewed and  are negative.    Allergies  Review of patient's allergies indicates no known allergies.  Home Medications   Current Outpatient Rx  Name Route Sig Dispense Refill  . CIPROFLOXACIN HCL 500 MG PO TABS Oral Take 500 mg by mouth 2 (two) times daily.    . GLYBURIDE 5 MG PO TABS Oral Take 1 tablet (5 mg total) by mouth 2 (two) times daily with a meal. 60 tablet 0  . METFORMIN HCL 500 MG PO TABS Oral Take 1 tablet (500 mg total) by mouth 2 (two) times daily with a meal. 60 tablet 1    BP 125/87  Pulse 93  Temp(Src) 98.6 F (37 C) (Oral)  Resp 16  SpO2 100%  Physical Exam  Nursing note and vitals reviewed. Constitutional: He is oriented to person, place, and time. He appears well-developed and well-nourished.  HENT:  Head: Normocephalic.  Eyes: Conjunctivae and EOM are normal. Pupils are equal, round, and reactive to light.  Neck: Normal range of motion. Neck supple.  Cardiovascular: Normal rate.   Pulmonary/Chest: Effort normal.  Abdominal: Soft.  Musculoskeletal: He exhibits tenderness. He exhibits no edema.       Pain with external rotation. No pain with internal rotation. No swelling to the joints skin is cool to touch. No cervical tenderness  Neurological: He is alert and oriented to person, place, and time.  Skin: Skin is warm and dry.  Psychiatric: He has a normal mood and affect.    ED Course  Procedures (including critical care time)  Labs Reviewed - No data to display No results  found.   No diagnosis found.    MDM  L shoulder pain with unremarkable x-ray.  Ibuprofen for pain in ER.  Rx for mobic and a few percocet.  Follow up with ortho in 1 week if not better.         Remi Haggard, NP 04/06/12 [phone]

## 2012-04-05 NOTE — ED Notes (Signed)
C/o L shoulder pain x 2 weeks.  No known injury.

## 2012-04-08 NOTE — ED Provider Notes (Signed)
Medical screening examination/treatment/procedure(s) were performed by non-physician practitioner and as supervising physician I was immediately available for consultation/collaboration.   Laray Anger, DO 04/08/12 0202

## 2012-04-12 ENCOUNTER — Encounter (HOSPITAL_COMMUNITY): Payer: Self-pay | Admitting: *Deleted

## 2012-04-12 ENCOUNTER — Observation Stay (HOSPITAL_COMMUNITY)
Admission: EM | Admit: 2012-04-12 | Discharge: 2012-04-12 | Disposition: A | Discharge status: Home / Self Care | Payer: Medicaid Other | Attending: Internal Medicine | Admitting: Internal Medicine

## 2012-04-12 ENCOUNTER — Emergency Department (HOSPITAL_COMMUNITY): Payer: Medicaid Other

## 2012-04-12 DIAGNOSIS — L97509 Non-pressure chronic ulcer of other part of unspecified foot with unspecified severity: Secondary | ICD-10-CM | POA: Diagnosis present

## 2012-04-12 DIAGNOSIS — E11621 Type 2 diabetes mellitus with foot ulcer: Secondary | ICD-10-CM

## 2012-04-12 DIAGNOSIS — L0390 Cellulitis, unspecified: Secondary | ICD-10-CM

## 2012-04-12 DIAGNOSIS — R55 Syncope and collapse: Secondary | ICD-10-CM

## 2012-04-12 DIAGNOSIS — I517 Cardiomegaly: Secondary | ICD-10-CM

## 2012-04-12 DIAGNOSIS — R112 Nausea with vomiting, unspecified: Secondary | ICD-10-CM | POA: Insufficient documentation

## 2012-04-12 DIAGNOSIS — E118 Type 2 diabetes mellitus with unspecified complications: Secondary | ICD-10-CM | POA: Diagnosis present

## 2012-04-12 DIAGNOSIS — E1169 Type 2 diabetes mellitus with other specified complication: Secondary | ICD-10-CM | POA: Insufficient documentation

## 2012-04-12 DIAGNOSIS — L97409 Non-pressure chronic ulcer of unspecified heel and midfoot with unspecified severity: Secondary | ICD-10-CM | POA: Insufficient documentation

## 2012-04-12 DIAGNOSIS — Z720 Tobacco use: Secondary | ICD-10-CM

## 2012-04-12 DIAGNOSIS — D72829 Elevated white blood cell count, unspecified: Secondary | ICD-10-CM | POA: Insufficient documentation

## 2012-04-12 DIAGNOSIS — F17200 Nicotine dependence, unspecified, uncomplicated: Secondary | ICD-10-CM | POA: Insufficient documentation

## 2012-04-12 HISTORY — DX: Inflammatory disease of prostate, unspecified: N41.9

## 2012-04-12 LAB — CARDIAC PANEL(CRET KIN+CKTOT+MB+TROPI)
CK, MB: 1.6 ng/mL (ref 0.3–4.0)
Total CK: 53 U/L (ref 7–232)
Troponin I: <0.3 ng/mL (ref <0.30)

## 2012-04-12 LAB — HEMOGLOBIN A1C
Hgb A1c MFr Bld: 9.3 % — ABNORMAL HIGH (ref <5.7)
Mean Plasma Glucose: 220 mg/dL — ABNORMAL HIGH (ref <117)

## 2012-04-12 LAB — POCT I-STAT, CHEM 8
Calcium, Ion: 1.12 mmol/L (ref 1.12–1.32)
HCT: 44 % (ref 39.0–52.0)
Hemoglobin: 15 g/dL (ref 13.0–17.0)
Sodium: 137 mEq/L (ref 135–145)
TCO2: 26 mmol/L (ref 0–100)

## 2012-04-12 LAB — URINALYSIS, ROUTINE W REFLEX MICROSCOPIC
Glucose, UA: >1000 mg/dL — AB
Ketones, ur: 15 mg/dL — AB
pH: 5 (ref 5.0–8.0)

## 2012-04-12 LAB — POCT I-STAT TROPONIN I: Troponin i, poc: 0.02 ng/mL (ref 0.00–0.08)

## 2012-04-12 LAB — TSH: TSH: 0.598 u[IU]/mL (ref 0.350–4.500)

## 2012-04-12 LAB — CBC
Hemoglobin: 14.3 g/dL (ref 13.0–17.0)
MCH: 30.1 pg (ref 26.0–34.0)
RBC: 4.75 MIL/uL (ref 4.22–5.81)

## 2012-04-12 LAB — COMPREHENSIVE METABOLIC PANEL
AST: 12 U/L (ref 0–37)
Albumin: 2.9 g/dL — ABNORMAL LOW (ref 3.5–5.2)
Calcium: 8.8 mg/dL (ref 8.4–10.5)
Creatinine, Ser: 1.08 mg/dL (ref 0.50–1.35)
GFR calc non Af Amer: 85 mL/min — ABNORMAL LOW (ref >90)
Total Protein: 7.3 g/dL (ref 6.0–8.3)

## 2012-04-12 LAB — GLUCOSE, CAPILLARY: Glucose-Capillary: 281 mg/dL — ABNORMAL HIGH (ref 70–99)

## 2012-04-12 LAB — URINE MICROSCOPIC-ADD ON

## 2012-04-12 MED ORDER — INSULIN GLARGINE 100 UNIT/ML ~~LOC~~ SOLN: 10.0000 [IU] | Freq: Every day | SUBCUTANEOUS | Status: DC

## 2012-04-12 MED ORDER — ENSURE COMPLETE PO LIQD: 237.0000 mL | Freq: Every day | ORAL | Status: DC

## 2012-04-12 MED ORDER — SODIUM CHLORIDE 0.9 % IV SOLN
INTRAVENOUS | Status: DC
  Administered 2012-04-12: 05:00:00 via INTRAVENOUS

## 2012-04-12 MED ORDER — CIPROFLOXACIN HCL 500 MG PO TABS: 500.0000 mg | ORAL_TABLET | Freq: Two times a day (BID) | ORAL | Status: DC

## 2012-04-12 MED ORDER — VANCOMYCIN HCL IN DEXTROSE 1-5 GM/200ML-% IV SOLN
1000.0000 mg | Freq: Three times a day (TID) | INTRAVENOUS | Status: DC
  Administered 2012-04-12: 1000 mg via INTRAVENOUS
  Filled 2012-04-12 (×3): qty 200

## 2012-04-12 MED ORDER — ACETAMINOPHEN 650 MG RE SUPP: 650.0000 mg | Freq: Four times a day (QID) | RECTAL | Status: DC | PRN

## 2012-04-12 MED ORDER — GLYBURIDE 5 MG PO TABS
5.0000 mg | ORAL_TABLET | Freq: Two times a day (BID) | ORAL | Status: DC
  Administered 2012-04-12: 5 mg via ORAL
  Filled 2012-04-12 (×3): qty 1

## 2012-04-12 MED ORDER — INSULIN ASPART 100 UNIT/ML ~~LOC~~ SOLN
8.0000 [IU] | Freq: Once | SUBCUTANEOUS | Status: AC
  Administered 2012-04-12: 8 [IU] via INTRAVENOUS
  Filled 2012-04-12: qty 1

## 2012-04-12 MED ORDER — IPRATROPIUM BROMIDE 0.02 % IN SOLN: 0.5000 mg | Freq: Four times a day (QID) | RESPIRATORY_TRACT | Status: DC

## 2012-04-12 MED ORDER — INSULIN ASPART 100 UNIT/ML ~~LOC~~ SOLN
3.0000 [IU] | Freq: Three times a day (TID) | SUBCUTANEOUS | Status: DC
  Administered 2012-04-12: 3 [IU] via SUBCUTANEOUS

## 2012-04-12 MED ORDER — INSULIN ASPART 100 UNIT/ML ~~LOC~~ SOLN
0.0000 [IU] | Freq: Three times a day (TID) | SUBCUTANEOUS | Status: DC
  Administered 2012-04-12: 3 [IU] via SUBCUTANEOUS

## 2012-04-12 MED ORDER — MELOXICAM 15 MG PO TABS
15.0000 mg | ORAL_TABLET | Freq: Every day | ORAL | Status: DC
  Administered 2012-04-12: 15 mg via ORAL
  Filled 2012-04-12: qty 1

## 2012-04-12 MED ORDER — LORAZEPAM 0.5 MG PO TABS: 1.0000 mg | ORAL_TABLET | Freq: Three times a day (TID) | ORAL | Status: DC | PRN

## 2012-04-12 MED ORDER — ONDANSETRON HCL 4 MG/2ML IJ SOLN: 4.0000 mg | Freq: Four times a day (QID) | INTRAMUSCULAR | Status: DC | PRN

## 2012-04-12 MED ORDER — INSULIN ASPART 100 UNIT/ML ~~LOC~~ SOLN
0.0000 [IU] | Freq: Three times a day (TID) | SUBCUTANEOUS | Status: DC
  Administered 2012-04-12: 5 [IU] via SUBCUTANEOUS

## 2012-04-12 MED ORDER — ALBUTEROL SULFATE (5 MG/ML) 0.5% IN NEBU: 2.5000 mg | INHALATION_SOLUTION | RESPIRATORY_TRACT | Status: DC | PRN

## 2012-04-12 MED ORDER — PANTOPRAZOLE SODIUM 40 MG PO TBEC
40.0000 mg | DELAYED_RELEASE_TABLET | Freq: Every day | ORAL | Status: DC
  Filled 2012-04-12: qty 1

## 2012-04-12 MED ORDER — NICOTINE 21 MG/24HR TD PT24
21.0000 mg | MEDICATED_PATCH | Freq: Every day | TRANSDERMAL | Status: DC
Start: 2012-04-12 — End: 2012-04-12
  Administered 2012-04-12: 21 mg via TRANSDERMAL
  Filled 2012-04-12: qty 1

## 2012-04-12 MED ORDER — DOXYCYCLINE HYCLATE 100 MG PO TABS: 100.0000 mg | ORAL_TABLET | Freq: Two times a day (BID) | ORAL | Status: DC

## 2012-04-12 MED ORDER — HYDROMORPHONE HCL PF 1 MG/ML IJ SOLN: 1.0000 mg | INTRAMUSCULAR | Status: DC | PRN

## 2012-04-12 MED ORDER — SODIUM CHLORIDE 0.9 % IV SOLN: INTRAVENOUS | Status: DC

## 2012-04-12 MED ORDER — PIPERACILLIN-TAZOBACTAM 3.375 G IVPB
3.3750 g | Freq: Once | INTRAVENOUS | Status: AC
  Administered 2012-04-12: 3.375 g via INTRAVENOUS
  Filled 2012-04-12: qty 50

## 2012-04-12 MED ORDER — VANCOMYCIN HCL IN DEXTROSE 1-5 GM/200ML-% IV SOLN
1000.0000 mg | Freq: Once | INTRAVENOUS | Status: AC
  Administered 2012-04-12: 1000 mg via INTRAVENOUS
  Filled 2012-04-12: qty 200

## 2012-04-12 MED ORDER — CIPROFLOXACIN HCL 500 MG PO TABS
500.0000 mg | ORAL_TABLET | Freq: Two times a day (BID) | ORAL | Status: DC
  Administered 2012-04-12: 500 mg via ORAL
  Filled 2012-04-12 (×3): qty 1

## 2012-04-12 MED ORDER — PROMETHAZINE HCL 12.5 MG PO TABS: 12.5000 mg | ORAL_TABLET | Freq: Four times a day (QID) | ORAL | Status: DC | PRN

## 2012-04-12 MED ORDER — GABAPENTIN 300 MG PO CAPS: 300.0000 mg | ORAL_CAPSULE | Freq: Two times a day (BID) | ORAL | Status: DC

## 2012-04-12 MED ORDER — ENOXAPARIN SODIUM 40 MG/0.4ML ~~LOC~~ SOLN
40.0000 mg | SUBCUTANEOUS | Status: DC
  Filled 2012-04-12: qty 0.4

## 2012-04-12 MED ORDER — ALBUTEROL SULFATE (5 MG/ML) 0.5% IN NEBU: 2.5000 mg | INHALATION_SOLUTION | Freq: Four times a day (QID) | RESPIRATORY_TRACT | Status: DC

## 2012-04-12 MED ORDER — OXYCODONE-ACETAMINOPHEN 5-325 MG PO TABS: 2.0000 | ORAL_TABLET | ORAL | Status: DC | PRN

## 2012-04-12 MED ORDER — SODIUM CHLORIDE 0.9 % IV BOLUS (SEPSIS)
1000.0000 mL | Freq: Once | INTRAVENOUS | Status: AC
  Administered 2012-04-12: 1000 mL via INTRAVENOUS

## 2012-04-12 MED ORDER — GUAIFENESIN ER 600 MG PO TB12
600.0000 mg | ORAL_TABLET | Freq: Two times a day (BID) | ORAL | Status: DC
  Administered 2012-04-12: 600 mg via ORAL
  Filled 2012-04-12 (×2): qty 1

## 2012-04-12 MED ORDER — ACETAMINOPHEN 325 MG PO TABS: 650.0000 mg | ORAL_TABLET | Freq: Four times a day (QID) | ORAL | Status: DC | PRN

## 2012-04-12 MED ORDER — SODIUM CHLORIDE 0.9 % IJ SOLN: 3.0000 mL | Freq: Two times a day (BID) | INTRAMUSCULAR | Status: DC

## 2012-04-12 NOTE — Progress Notes (Signed)
Patient is expressing concern over being stuck for labs too many times. He would like to see a doctor before anymore labs are drawn. Derrick [name], Melida Quitter

## 2012-04-12 NOTE — H&P (Signed)
Derrick [name] is an 41 y.o. male.   Chief Complaint: Syncope HPI: A 41 year old gentleman who is pretty much active but a known diabetic with right diabetic foot ulcer presenting to the emergency room after passing out twice tonight. Per wife and patient he played some baseball today as well as bowling. He kept up with fluids while playing but when he arrived home he was not feeling well. He started feeling nausea and actually vomited followed by passing out. According to his wife his color changed to ash color and he was out for a few minutes. She checked his sugar which was more than 200. She did not check his blood pressure but it was normal according to EMS. Patient had no fever or chills. He has a diabetic foot ulcer that is being treated every week at the wound center. He also had no headache, no prior history of stroke, no cardiac history, no family history of sudden death. Patient apparently had a similar episode last Christmas. He did not come to the hospital for evaluation. He is now at his baseline. So far his evaluation showed a white count of 27,000 but no fever. Otherwise he's been having cough at night and is presumed to have COPD.   Past Medical History  Diagnosis Date  . Diabetes mellitus   . Prostatitis     Past Surgical History  Procedure Date  . Toe amputation     multiple toes amputated.    History reviewed. No pertinent family history. Social History:  reports that he has been smoking Cigarettes.  He has been smoking about 2 packs per day. He does not have any smokeless tobacco history on file. He reports that he does not drink alcohol or use illicit drugs.  Allergies: No Known Allergies   (Not in a hospital admission)  Results for orders placed during the hospital encounter of 04/12/12 (from the past 48 hour(s))  CBC     Status: Abnormal   Collection Time   04/12/12  1:00 AM      Component Value Range Comment   WBC 27.1 (*) 4.0 - 10.5 (K/uL)    RBC 4.75  4.22 -  5.81 (MIL/uL)    Hemoglobin 14.3  13.0 - 17.0 (g/dL)    HCT 14.7  82.9 - 56.2 (%)    MCV 85.1  78.0 - 100.0 (fL)    MCH 30.1  26.0 - 34.0 (pg)    MCHC 35.4  30.0 - 36.0 (g/dL)    RDW 13.0  86.5 - 78.4 (%)    Platelets 327  150 - 400 (K/uL)   POCT I-STAT TROPONIN I     Status: Normal   Collection Time   04/12/12  1:02 AM      Component Value Range Comment   Troponin i, poc 0.02  0.00 - 0.08 (ng/mL)    Comment 3            POCT I-STAT, CHEM 8     Status: Abnormal   Collection Time   04/12/12  1:04 AM      Component Value Range Comment   Sodium 137  135 - 145 (mEq/L)    Potassium 3.6  3.5 - 5.1 (mEq/L)    Chloride 100  96 - 112 (mEq/L)    BUN 18  6 - 23 (mg/dL)    Creatinine, Ser 6.96  0.50 - 1.35 (mg/dL)    Glucose, Bld 295 (*) 70 - 99 (mg/dL)    Calcium,  Ion 1.12  1.12 - 1.32 (mmol/L)    TCO2 26  0 - 100 (mmol/L)    Hemoglobin 15.0  13.0 - 17.0 (g/dL)    HCT 16.1  09.6 - 04.5 (%)   URINALYSIS, ROUTINE W REFLEX MICROSCOPIC     Status: Abnormal   Collection Time   04/12/12  1:13 AM      Component Value Range Comment   Color, Urine AMBER (*) YELLOW  BIOCHEMICALS MAY BE AFFECTED BY COLOR   APPearance CLOUDY (*) CLEAR     Specific Gravity, Urine 1.030  1.005 - 1.030     pH 5.0  5.0 - 8.0     Glucose, UA >1000 (*) NEGATIVE (mg/dL)    Hgb urine dipstick LARGE (*) NEGATIVE     Bilirubin Urine SMALL (*) NEGATIVE     Ketones, ur 15 (*) NEGATIVE (mg/dL)    Protein, ur >409 (*) NEGATIVE (mg/dL)    Urobilinogen, UA 0.2  0.0 - 1.0 (mg/dL)    Nitrite NEGATIVE  NEGATIVE     Leukocytes, UA SMALL (*) NEGATIVE    URINE MICROSCOPIC-ADD ON     Status: Abnormal   Collection Time   04/12/12  1:13 AM      Component Value Range Comment   Squamous Epithelial / LPF RARE  RARE     WBC, UA 11-20  <3 (WBC/hpf)    RBC / HPF 11-20  <3 (RBC/hpf)    Bacteria, UA FEW (*) RARE     Casts HYALINE CASTS (*) NEGATIVE  GRANULAR CAST  CARDIAC PANEL(CRET KIN+CKTOT+MB+TROPI)     Status: Normal   Collection Time    04/12/12  1:37 AM      Component Value Range Comment   Total CK 53  7 - 232 (U/L)    CK, MB 1.6  0.3 - 4.0 (ng/mL)    Troponin I <0.30  <0.30 (ng/mL)    Relative Index RELATIVE INDEX IS INVALID  0.0 - 2.5     Dg Chest Portable 1 View  04/12/2012  *RADIOLOGY REPORT*  Clinical Data: Near syncope, confusion and weakness.  PORTABLE CHEST - 1 VIEW  Comparison: 04/18/2010  Findings: Shallow inspiration.  Normal heart size and pulmonary vascularity.  No focal airspace consolidation in the lungs.  No blunting of costophrenic angles.  No pneumothorax.  No significant change since previous study.  IMPRESSION: No evidence of active pulmonary disease.  Original Report Authenticated By: Marlon Pel, M.D.   Dg Foot Complete Right  04/12/2012  *RADIOLOGY REPORT*  Clinical Data: Diabetic ulcer on the bottom of the foot near the big toe.  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  Bones appear otherwise intact.  No evidence of acute fracture or subluxation.  No evidence of any focal bone destruction or sclerosis or periosteal reaction.  No findings to suggest osteomyelitis by plain film criteria.  No radiopaque foreign bodies are gas collections in the soft tissues. Degenerative changes in the intertarsal joints.  Small plantar and Achilles calcaneal spurs.  Stable appearance since previous study.  IMPRESSION: Previous amputation of the right fourth toe.  No acute bony abnormalities.  No radiographic evidence of osteomyelitis.  Original Report Authenticated By: Marlon Pel, M.D.    Review of Systems  Constitutional: Negative for fever and chills.  HENT: Negative.   Eyes: Negative.   Respiratory: Positive for cough and shortness of breath. Negative for hemoptysis, sputum  production and wheezing.   Cardiovascular: Negative.   Gastrointestinal: Positive for nausea. Negative for abdominal pain,  diarrhea, constipation, blood in stool and melena.  Genitourinary: Negative.   Musculoskeletal: Negative.   Skin: Negative.   Neurological: Positive for loss of consciousness. Negative for tremors, sensory change, speech change, focal weakness and seizures.  Psychiatric/Behavioral: Negative.     Blood pressure 132/85, pulse 96, temperature 97.4 F (36.3 C), temperature source Oral, resp. rate 18, SpO2 99.00%. Physical Exam  Constitutional: He is oriented to person, place, and time. He appears well-developed and well-nourished.  HENT:  Head: Normocephalic and atraumatic.  Right Ear: External ear normal.  Left Ear: External ear normal.  Nose: Nose normal.  Mouth/Throat: Oropharynx is clear and moist.  Eyes: Conjunctivae and EOM are normal. Pupils are equal, round, and reactive to light.  Neck: Normal range of motion. Neck supple.  Cardiovascular: Normal rate, regular rhythm, normal heart sounds and intact distal pulses.   Respiratory: Effort normal and breath sounds normal.  GI: Soft. Bowel sounds are normal.  Musculoskeletal: Normal range of motion.  Neurological: He is alert and oriented to person, place, and time. He has normal reflexes.  Skin: Skin is warm and dry.  Psychiatric: He has a normal mood and affect. His behavior is normal. Judgment and thought content normal.     Assessment/Plan Assessment this is a 41 year old gentleman presenting with syncopal episode that is recurrent as well as diabetic foot ulcer and leukocytosis. The cause of his syncope could be simple vasovagal or cardiac. Less likely to be hyperglycemia since his sugar was normal. His initial orthostatic vital signs in the ED he was said to be normal. No cardiac history and no obvious arrhythmia on his EKG. He seems to be mildly dehydrated from his urinalysis however. Plan #1 syncope: We'll admit the patient to a telemetry bed, check 2-D echo, carotid Dopplers MRI MRA of the brain to rule out aneurythm or  intra-cerebrovascular disease. We will hydrate the patient and monitor him closely. He may benefit from EP studies especially tilt table test if all his test come back as negative. #2 diabetes: Check hemoglobin A1c we will hold the metformin and while in the hospital and continues his medications. Operative sliding scale insulin. #3 diabetic foot ulcer: Continue wound care. With his leukocytosis would empirically treat with antibiotics. He had history of MRSA so we'll add vancomycin. #4 leukocytosis: More than likely from his diabetic foot ulcer but also could be reactive. Continue to monitor his white count while treating him with antibiotics.  #5 tobacco abuse: Tobacco cessation counseling and all over a nicotine patch if desired. #6 recent prostatitis: Patient is on Cipro at the moment. We will continue that to complete his treatment  Mads Borgmeyer,LAWAL 04/12/2012, 3:09 AM

## 2012-04-12 NOTE — Discharge Summary (Signed)
Physician Discharge Summary  Patient ID: Derrick [name] MRN: [id number] DOB/AGE: November 16, 1971 41 y.o.  Admit date: 04/12/2012 Discharge date: 04/12/2012  Primary Care Physician:  [name], MD, MD  Discharge Diagnoses:    . near-syncope  .Diabetic toe ulcer- right foot  .Leukocytosis .Diabetes mellitus .Tobacco abuse  Consults:  Wound care   Discharge Medications: Medication List  As of 04/12/2012  5:30 PM   TAKE these medications         ciprofloxacin 500 MG tablet   Commonly known as: CIPRO   Take 1 tablet (500 mg total) by mouth 2 (two) times daily. X 2 weeks      doxycycline 100 MG tablet   Commonly known as: VIBRA-TABS   Take 1 tablet (100 mg total) by mouth 2 (two) times daily. X 2weeks      gabapentin 300 MG capsule   Commonly known as: NEURONTIN   Take 1 capsule (300 mg total) by mouth 2 (two) times daily. For neuropathy      glyBURIDE 5 MG tablet   Commonly known as: DIABETA   Take 1 tablet (5 mg total) by mouth 2 (two) times daily with a meal.      meloxicam 15 MG tablet   Commonly known as: MOBIC   Take 1 tablet (15 mg total) by mouth daily.      metFORMIN 500 MG tablet   Commonly known as: GLUCOPHAGE   Take 1 tablet (500 mg total) by mouth 2 (two) times daily with a meal.      oxyCODONE-acetaminophen 5-325 MG per tablet   Commonly known as: PERCOCET   Take 2 tablets by mouth every 4 (four) hours as needed for pain.      promethazine 12.5 MG tablet   Commonly known as: PHENERGAN   Take 1 tablet (12.5 mg total) by mouth every 6 (six) hours as needed for nausea.             Brief H and P: For complete details please refer to admission H and P, but in brief per the admission history by Dr. [name], patient is 41 year old gentleman who is pretty much active but a known diabetic with right diabetic foot ulcer presenting to the emergency room after passing out twice tonight. Per wife and patient he played some baseball today as well as bowling. He  kept up with fluids while playing but when he arrived home he was not feeling well. He started feeling nausea and actually vomited followed by passing out. According to his wife his color changed to ash color and he was out for a few minutes. She checked his sugar which was more than 200. She did not check his blood pressure but it was normal according to EMS. Patient had no fever or chills. He has a diabetic foot ulcer that is being treated every week at the wound center. He also had no headache, no prior history of stroke, no cardiac history, no family history of sudden death. Patient apparently had a similar episode last Christmas. He did not come to the hospital for evaluation. He is now at his baseline. So far his evaluation showed a white count of 27,000 but no fever. Otherwise he's been having cough at night and is presumed to have COPD   Hospital Course:   Near syncope: Patient was admitted for syncope workup, however at the time of my encounter patient denied any syncopal episode, he denied any passing out. He stated that he felt he  DC'd with nausea and spell but retained his consciousness through the whole event.  Patient did not appear to have any neurological deficits, never lost any consciousness, possibly a vasovagal action. He underwent 2-D echocardiogram and carotid Dopplers. Carotid Doppler showed no evidence of internal carotid artery stenosis bilaterally. 2-D echocardiogram showed EF of 55-60% normal wall motion, systolic function normal.  Diabetic toe ulcer: Patient had a recent MRI of the right foot in March 2013 which did not show any osteomyelitis. wound care consult was obtained and wound culture were sent. The patient was started on vancomycin, Zosyn and ciprofloxacin. Dressing was done by the wound care. He did have leukocytosis (white count of 27.1) on the labs on admission. He refused to have repeat lab testing. The patient did not want to stay in inpatient to continue the  antibiotics or find out the results of the wound culture. Patient and his wife showed me that they will followup with the wound care Center there did receive the care for his diabetic foot and follow the cultures there. I placed him on doxycycline and ciprofloxacin based on his previous for cultures to continue for 2 weeks and I counseled him strongly to be compliant with his medications. Patient has an appointment with wound care center on 04/17/2012.  Diabetes mellitus: Noncompliant, during hospitalization patient refused to eat carb modified diet, he was placed on Lantus and meal coverage, moderate sliding scale. Patient was counseled to be compliant with his medications.     Day of Discharge BP 123/79  Pulse 101  Temp(Src) 98.1 F (36.7 C) (Oral)  Resp 16  Ht 6\' 1"  (1.854 m)  Wt 96.9 kg (213 lb 10 oz)  BMI 28.18 kg/m2  SpO2 99%  Physical Exam: General: Alert and awake oriented x3 not in any acute distress. HEENT: anicteric sclera, pupils reactive to light and accommodation CVS: S1-S2 clear no murmur rubs or gallops Chest: clear to auscultation bilaterally, no wheezing rales or rhonchi Abdomen: soft nontender, nondistended, normal bowel sounds, no organomegaly Extremities: no cyanosis, clubbing or edema noted bilaterally, right foot wound    The results of significant diagnostics from this hospitalization (including imaging, microbiology, ancillary and laboratory) are listed below for reference.    LAB RESULTS: Basic Metabolic Panel:  Lab 04/12/12 1610 04/12/12 0104  NA 132* 137  K 3.9 3.6  CL 96 100  CO2 26 --  GLUCOSE 323* 337*  BUN 22 18  CREATININE 1.08 1.20  CALCIUM 8.8 --  MG -- --  PHOS -- --   Liver Function Tests:  Lab 04/12/12 0530  AST 12  ALT 10  ALKPHOS 105  BILITOT 0.2*  PROT 7.3  ALBUMIN 2.9*   CBC:  Lab 04/12/12 0104 04/12/12 0100  WBC -- 27.1*  NEUTROABS -- --  HGB 15.0 14.3  HCT 44.0 40.4  MCV -- 85.1  PLT -- 327   Cardiac  Enzymes:  Lab 04/12/12 0137  CKTOTAL 53  CKMB 1.6  CKMBINDEX --  TROPONINI <0.30   CBG:  Lab 04/12/12 1658 04/12/12 1132  GLUCAP 153* 167*    Significant Diagnostic Studies:  No results found.   Disposition and Follow-up: Discharge Orders    Future Appointments: Provider: Department: Dept Phone: Center:   04/17/2012 9:30 AM [phone]   05/22/2012 8:00 AM [facility] [phone]     Future Orders Please Complete By Expires   Diet - low sodium heart healthy  Increase activity slowly      Discharge wound care:      Comments:   Wound care: please follow-up with wound care center and follow up with your wound culture results       DISPOSITION: Home DIET: Carb modified heart healthy  ACTIVITY: As tolerated  TESTS THAT NEED FOLLOW-UP Wound Culture results  DISCHARGE FOLLOW-UP Follow-up Information    Follow up with Dr Debroah Loop. Schedule an appointment as soon as possible for a visit in 10 days. (for hospital follow-up and wound care)          Time spent on Discharge: 45 mins  Signed:  Aunesty Tyson M.D. [facility] 04/12/2012, 5:30 PM

## 2012-04-12 NOTE — Progress Notes (Signed)
D/C instructions explained and given to pt and spouse.  Verbalized understanding.  Amanda Pea, RN

## 2012-04-12 NOTE — ED Notes (Signed)
Per EMS: pt states he went to go out the door and hit the door and fell.  Does not remember the incident.  Denies cp, SOB, diaphoresis, does c/o n/v.

## 2012-04-12 NOTE — Progress Notes (Signed)
Patient ID: Derrick [name]  male  ZOX:[id number]    DOB: January 18, 1971    DOA: 04/12/2012  PCP: [name], MD, MD  Subjective: Patient was seen and examined today, admitted by Dr. [name] this morning. Patient very adamantly denies any syncopal episode, he stated that he was nauseous, somewhat dizzy and fell, however he never passed out. During the whole encounter patient was noticed to be rude,  complaining about food, labs, medical management. He called his family in front of me and asked them to bring the "real food" stating that "I am not going to this diet business". He refused to get any labs, asked the nurse to DC his telemetry and IV line.  Objective: Weight change:   Intake/Output Summary (Last 24 hours) at 04/12/12 1335 Last data filed at 04/12/12 1100  Gross per 24 hour  Intake      0 ml  Output      0 ml  Net      0 ml   Blood pressure 123/79, pulse 101, temperature 98.1 F (36.7 C), temperature source Oral, resp. rate 16, height 6\' 1"  (1.854 m), weight 96.9 kg (213 lb 10 oz), SpO2 99.00%.  Physical Exam: General: Alert and awake, oriented x3, not in any acute distress. HEENT: anicteric sclera, pupils reactive to light and accommodation, EOMI CVS: S1-S2 clear, no murmur rubs or gallops Chest: clear to auscultation bilaterally, no wheezing, rales or rhonchi Abdomen: soft nontender, nondistended, normal bowel sounds, no organomegaly Extremities: no cyanosis, clubbing or edema noted bilaterally. Right foot has diabetic nonhealing ulcer. Neuro: Cranial nerves II-XII intact, no focal neurological deficits  Lab Results: Basic Metabolic Panel:  Lab 04/12/12 8119 04/12/12 0104  NA 132* 137  K 3.9 3.6  CL 96 100  CO2 26 --  GLUCOSE 323* 337*  BUN 22 18  CREATININE 1.08 1.20  CALCIUM 8.8 --  MG -- --  PHOS -- --   Liver Function Tests:  Lab 04/12/12 0530  AST 12  ALT 10  ALKPHOS 105  BILITOT 0.2*  PROT 7.3  ALBUMIN 2.9*   No results found for this basename:  LIPASE:2,AMYLASE:2 in the last 168 hours No results found for this basename: AMMONIA:2 in the last 168 hours CBC:  Lab 04/12/12 0104 04/12/12 0100  WBC -- 27.1*  NEUTROABS -- --  HGB 15.0 14.3  HCT 44.0 40.4  MCV -- 85.1  PLT -- 327   Cardiac Enzymes:  Lab 04/12/12 0137  CKTOTAL 53  CKMB 1.6  CKMBINDEX --  TROPONINI <0.30   BNP: No components found with this basename: POCBNP:2 CBG:  Lab 04/12/12 1132 04/12/12 0620 04/12/12 0231  GLUCAP 167* 281* 326*     Micro Results: Recent Results (from the past 240 hour(s))  MRSA PCR SCREENING     Status: Normal   Collection Time   04/12/12  4:54 AM      Component Value Range Status Comment   MRSA by PCR NEGATIVE  NEGATIVE  Final     Studies/Results: Dg Chest Portable 1 View  04/12/2012  *RADIOLOGY REPORT*  Clinical Data: Near syncope, confusion and weakness.  PORTABLE CHEST - 1 VIEW  Comparison: 04/18/2010  Findings: Shallow inspiration.  Normal heart size and pulmonary vascularity.  No focal airspace consolidation in the lungs.  No blunting of costophrenic angles.  No pneumothorax.  No significant change since previous study.  IMPRESSION: No evidence of active pulmonary disease.  Original Report Authenticated By: Marlon Pel, M.D.   Dg Shoulder Left  04/05/2012  *RADIOLOGY REPORT*  Clinical Data: Worsening left shoulder pain over the past 2 weeks.  LEFT SHOULDER - 2+ VIEW  Comparison: None.  Findings: Imaged bones, joints and soft tissues appear normal.  IMPRESSION: Normal study.  Original Report Authenticated By: Bernadene Bell. D'ALESSIO, M.D.   Dg Foot Complete Right  04/12/2012  *RADIOLOGY REPORT*  Clinical Data: Diabetic ulcer on the bottom of the foot near the big toe.  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  Bones appear otherwise intact.  No evidence of acute fracture or subluxation.  No evidence of any focal bone  destruction or sclerosis or periosteal reaction.  No findings to suggest osteomyelitis by plain film criteria.  No radiopaque foreign bodies are gas collections in the soft tissues. Degenerative changes in the intertarsal joints.  Small plantar and Achilles calcaneal spurs.  Stable appearance since previous study.  IMPRESSION: Previous amputation of the right fourth toe.  No acute bony abnormalities.  No radiographic evidence of osteomyelitis.  Original Report Authenticated By: Marlon Pel, M.D.    Medications: Scheduled Meds:   . albuterol  2.5 mg Nebulization Q6H  . ciprofloxacin  500 mg Oral BID  . enoxaparin  40 mg Subcutaneous Q24H  . feeding supplement  237 mL Oral Q1200  . glyBURIDE  5 mg Oral BID WC  . guaiFENesin  600 mg Oral BID  . insulin aspart  0-15 Units Subcutaneous TID WC  . insulin aspart  3 Units Subcutaneous TID WC  . insulin aspart  8 Units Intravenous Once  . insulin glargine  10 Units Subcutaneous QHS  . ipratropium  0.5 mg Nebulization Q6H  . meloxicam  15 mg Oral Daily  . nicotine  21 mg Transdermal Daily  . pantoprazole  40 mg Oral Q0600  . piperacillin-tazobactam (ZOSYN)  IV  3.375 g Intravenous Once  . sodium chloride  1,000 mL Intravenous Once  . sodium chloride  3 mL Intravenous Q12H  . vancomycin  1,000 mg Intravenous Once  . vancomycin  1,000 mg Intravenous Q8H  . DISCONTD: insulin aspart  0-9 Units Subcutaneous TID WC   Continuous Infusions:   . sodium chloride 125 mL/hr at 04/12/12 0518  . DISCONTD: sodium chloride       Assessment/Plan: Principal Problem:  *Syncope and collapse: Appears to be near syncope - Patient does not appear to have any neurological deficits, never lost any consciousness, possibly a vasovagal action - DC'd MRI/MRA brain. Continue with 2-D echo and carotid Doppler for further workup  Active Problems:  Diabetic toe ulcer:  - Recent MRI of the right foot did not show any osteomyelitis  - Wound culture, wound care  consult, continue with vancomycin, Zosyn and ciprofloxacin - Will follow wound cultures for appropriate antibiotics were DC. The patient had leukocytosis on the labs done at admission, he refused to get the labs this morning for comparison   Diabetes mellitus: - Patient refuses to eat carb modified diet, placed on Lantus and meal coverage, moderate sliding scale   Leukocytosis: Likely secondary to diabetic foot ulcer   Tobacco abuse: Placed on nicotine patch  DVT Prophylaxis: Lovenox  Code Status:  Disposition: Hopefully in one or 2 days pending wound cultures and imagings above   LOS: 0 days   Kathryn Cosby M.D. [facility] 04/12/2012, 1:35 PM Pager: [phone]

## 2012-04-12 NOTE — Progress Notes (Signed)
  Echocardiogram 2D Echocardiogram has been performed.  [name] 04/12/2012, 1:52 PM

## 2012-04-12 NOTE — Consult Note (Signed)
WOC consult Note Reason for Consult: Consult requested for right foot wound. Wound type: Chronic full thickness wound, pt states it is followed by the outpatient wound care center and they apply a silver dressing which is changed Q day.  Will continue with present plan of care and pt can resume follow-up after discharge with wound center. Measurement: 1X1X.2cm on plantar surface of foot. Wound bed: 20% eschar, 80% red Drainage (amount, consistency, odor) Mod tan drainage, no odor Periwound: Callous surrounding, no erythemia. Dressing procedure/placement/frequency: Aquacel to absorb drainage and provide antimicrobial benefits. Will not plan to follow further unless re-consulted.  [address], RN, MSN, [employer]  [phone]

## 2012-04-12 NOTE — ED Notes (Signed)
CBG Result = 326

## 2012-04-12 NOTE — ED Provider Notes (Signed)
History     CSN: [id number]  Arrival date & time 04/12/12  0043   First MD Initiated Contact with Patient 04/12/12 0051      Chief Complaint  Patient presents with  . Loss of Consciousness    (Consider location/radiation/quality/duration/timing/severity/associated sxs/prior treatment) HPI History provided by the patient. At home tonight had 2 near syncopal/ syncopal evidence. Patient states he felt like his you pass out was able to walk to the bed and went to the floor. He denies LOC. His wife states he actually had 2 events and she believes that he did pass out.  Patient denies any chest pain or shortness of breath. He has had ongoing cough for about the last 2 years and is a smoker. The cough is unchanged without sputum production. No fevers or chills. Patient does have an ulcer on the bottom of his right foot. He is followed by the wound clinic. He has been on antibiotics for this recently and states that it has pain but he feels it is not getting any worse. No abdominal pains. No nausea vomiting or diarrhea. No rashes. No recent travel. After event his wife checked his blood sugar at home and states he was in normal range. Per EMS blood sugar was 300 on arrival. The patient arrives to the ED and states he is feeling well now without complaints. No history of same. Past Medical History  Diagnosis Date  . Diabetes mellitus     Past Surgical History  Procedure Date  . Toe amputation     multiple toes amputated.    History reviewed. No pertinent family history.  History  Substance Use Topics  . Smoking status: Current Everyday Smoker -- 2.0 packs/day    Types: Cigarettes  . Smokeless tobacco: Not on file  . Alcohol Use: No      Review of Systems  Constitutional: Negative for fever and chills.  HENT: Negative for neck pain and neck stiffness.   Eyes: Negative for pain.  Respiratory: Negative for shortness of breath.   Cardiovascular: Negative for chest pain.    Gastrointestinal: Negative for abdominal pain.  Genitourinary: Negative for dysuria.  Musculoskeletal: Negative for back pain.  Skin: Negative for rash.  Neurological: Positive for syncope. Negative for headaches.  All other systems reviewed and are negative.    Allergies  Review of patient's allergies indicates no known allergies.  Home Medications   Current Outpatient Rx  Name Route Sig Dispense Refill  . CIPROFLOXACIN HCL 500 MG PO TABS Oral Take 500 mg by mouth 2 (two) times daily.    . GLYBURIDE 5 MG PO TABS Oral Take 1 tablet (5 mg total) by mouth 2 (two) times daily with a meal. 60 tablet 0  . METFORMIN HCL 500 MG PO TABS Oral Take 1 tablet (500 mg total) by mouth 2 (two) times daily with a meal. 60 tablet 1  . MELOXICAM 15 MG PO TABS Oral Take 1 tablet (15 mg total) by mouth daily. 20 tablet 0  . OXYCODONE-ACETAMINOPHEN 5-325 MG PO TABS Oral Take 2 tablets by mouth every 4 (four) hours as needed for pain. 6 tablet 0    BP 132/85  Pulse 96  Temp(Src) 97.4 F (36.3 C) (Oral)  Resp 18  SpO2 99%  Physical Exam  Constitutional: He is oriented to person, place, and time. He appears well-developed and well-nourished.  HENT:  Head: Normocephalic and atraumatic.  Eyes: Conjunctivae and EOM are normal. Pupils are equal, round, and reactive to light.  Neck: Trachea normal. Neck supple. No thyromegaly present.  Cardiovascular: Normal rate, regular rhythm, S1 normal, S2 normal and normal pulses.     No systolic murmur is present   No diastolic murmur is present  Pulses:      Radial pulses are 2+ on the right side, and 2+ on the left side.  Pulmonary/Chest: Effort normal and breath sounds normal. He has no wheezes. He has no rhonchi. He has no rales. He exhibits no tenderness.  Abdominal: Soft. Normal appearance and bowel sounds are normal. There is no tenderness. There is no CVA tenderness and negative Murphy's sign.  Musculoskeletal:       BLE:s Calves nontender, no cords  or erythema, negative Homans sign  Neurological: He is alert and oriented to person, place, and time. He has normal strength. No cranial nerve deficit or sensory deficit. GCS eye subscore is 4. GCS verbal subscore is 5. GCS motor subscore is 6.  Skin: Skin is warm and dry. No rash noted. He is not diaphoretic.  Psychiatric: His speech is normal.       Cooperative and appropriate    ED Course  Procedures (including critical care time)  Results for orders placed during the hospital encounter of 04/12/12  CBC      Component Value Range   WBC 27.1 (*) 4.0 - 10.5 (K/uL)   RBC 4.75  4.22 - 5.81 (MIL/uL)   Hemoglobin 14.3  13.0 - 17.0 (g/dL)   HCT 40.9  81.1 - 91.4 (%)   MCV 85.1  78.0 - 100.0 (fL)   MCH 30.1  26.0 - 34.0 (pg)   MCHC 35.4  30.0 - 36.0 (g/dL)   RDW 78.2  95.6 - 21.3 (%)   Platelets 327  150 - 400 (K/uL)  URINALYSIS, ROUTINE W REFLEX MICROSCOPIC      Component Value Range   Color, Urine AMBER (*) YELLOW    APPearance CLOUDY (*) CLEAR    Specific Gravity, Urine 1.030  1.005 - 1.030    pH 5.0  5.0 - 8.0    Glucose, UA >1000 (*) NEGATIVE (mg/dL)   Hgb urine dipstick LARGE (*) NEGATIVE    Bilirubin Urine SMALL (*) NEGATIVE    Ketones, ur 15 (*) NEGATIVE (mg/dL)   Protein, ur >086 (*) NEGATIVE (mg/dL)   Urobilinogen, UA 0.2  0.0 - 1.0 (mg/dL)   Nitrite NEGATIVE  NEGATIVE    Leukocytes, UA SMALL (*) NEGATIVE   POCT I-STAT, CHEM 8      Component Value Range   Sodium 137  135 - 145 (mEq/L)   Potassium 3.6  3.5 - 5.1 (mEq/L)   Chloride 100  96 - 112 (mEq/L)   BUN 18  6 - 23 (mg/dL)   Creatinine, Ser 5.78  0.50 - 1.35 (mg/dL)   Glucose, Bld 469 (*) 70 - 99 (mg/dL)   Calcium, Ion 6.29  5.28 - 1.32 (mmol/L)   TCO2 26  0 - 100 (mmol/L)   Hemoglobin 15.0  13.0 - 17.0 (g/dL)   HCT 41.3  24.4 - 01.0 (%)  POCT I-STAT TROPONIN I      Component Value Range   Troponin i, poc 0.02  0.00 - 0.08 (ng/mL)   Comment 3           URINE MICROSCOPIC-ADD ON      Component Value Range    Squamous Epithelial / LPF RARE  RARE    WBC, UA 11-20  <3 (WBC/hpf)   RBC / HPF 11-20  <  3 (RBC/hpf)   Bacteria, UA FEW (*) RARE    Casts HYALINE CASTS (*) NEGATIVE   CARDIAC PANEL(CRET KIN+CKTOT+MB+TROPI)      Component Value Range   Total CK 53  7 - 232 (U/L)   CK, MB 1.6  0.3 - 4.0 (ng/mL)   Troponin I <0.30  <0.30 (ng/mL)   Relative Index RELATIVE INDEX IS INVALID  0.0 - 2.5    Dg Chest Portable 1 View  04/12/2012  *RADIOLOGY REPORT*  Clinical Data: Near syncope, confusion and weakness.  PORTABLE CHEST - 1 VIEW  Comparison: 04/18/2010  Findings: Shallow inspiration.  Normal heart size and pulmonary vascularity.  No focal airspace consolidation in the lungs.  No blunting of costophrenic angles.  No pneumothorax.  No significant change since previous study.  IMPRESSION: No evidence of active pulmonary disease.  Original Report Authenticated By: Marlon Pel, M.D.         Date: 04/12/2012  Rate: 100  Rhythm: normal sinus rhythm  QRS Axis: normal  Intervals: normal  ST/T Wave abnormalities: nonspecific ST changes  Conduction Disutrbances:none  Narrative Interpretation:   Old EKG Reviewed: none available  Screening EKG reviewed as above no acute ischemia.  IVFs. IV ABx for elevated WBC. Xray R foot obtained for diabetic ulcer ? Infectious source. No ABD pain or rash or infection clinical o/w.     2:42 AM d/w Dr [name] on call for [facility], he will admit.    MDM   Multiple syncopal events at home. Elevated white blood cell count. Borderline tachycardia ED. Insulin provided for hyperglycemia. Admit to medicine service.        Sunnie Nielsen, MD 04/12/12 [phone]

## 2012-04-12 NOTE — Progress Notes (Signed)
INITIAL ADULT NUTRITION ASSESSMENT Date: 04/12/2012   Time: 11:57 AM Reason for Assessment: nutrition risk, weight loss  ASSESSMENT: Male 41 y.o.  Dx: Syncope and collapse  Hx:  Past Medical History  Diagnosis Date  . Diabetes mellitus   . Prostatitis     Related Meds:     . albuterol  2.5 mg Nebulization Q6H  . ciprofloxacin  500 mg Oral BID  . enoxaparin  40 mg Subcutaneous Q24H  . glyBURIDE  5 mg Oral BID WC  . guaiFENesin  600 mg Oral BID  . insulin aspart  0-15 Units Subcutaneous TID WC  . insulin aspart  3 Units Subcutaneous TID WC  . insulin aspart  8 Units Intravenous Once  . insulin glargine  10 Units Subcutaneous QHS  . ipratropium  0.5 mg Nebulization Q6H  . meloxicam  15 mg Oral Daily  . nicotine  21 mg Transdermal Daily  . pantoprazole  40 mg Oral Q0600  . piperacillin-tazobactam (ZOSYN)  IV  3.375 g Intravenous Once  . sodium chloride  1,000 mL Intravenous Once  . sodium chloride  3 mL Intravenous Q12H  . vancomycin  1,000 mg Intravenous Once  . vancomycin  1,000 mg Intravenous Q8H  . DISCONTD: insulin aspart  0-9 Units Subcutaneous TID WC     Ht: 6\' 1"  (185.4 cm)  Wt: 213 lb 10 oz (96.9 kg)  Ideal Wt: 83.6 kg % Ideal Wt: 116%  Usual Wt: 229 lbs (104 kg) per pt report Wt Readings from Last 3 Encounters:  04/12/12 213 lb 10 oz (96.9 kg)  02/26/12 220 lb 4.8 oz (99.927 kg)  02/03/12 215 lb (97.523 kg)    % Usual Wt: 93% of pt's reported UBW  Body mass index is 28.18 kg/(m^2). Over weight  Food/Nutrition Related Hx: decreased appetite on occasion  Labs:  CMP     Component Value Date/Time   NA 132* 04/12/2012 0530   K 3.9 04/12/2012 0530   CL 96 04/12/2012 0530   CO2 26 04/12/2012 0530   GLUCOSE 323* 04/12/2012 0530   BUN 22 04/12/2012 0530   CREATININE 1.08 04/12/2012 0530   CALCIUM 8.8 04/12/2012 0530   PROT 7.3 04/12/2012 0530   ALBUMIN 2.9* 04/12/2012 0530   AST 12 04/12/2012 0530   ALT 10 04/12/2012 0530   ALKPHOS 105 04/12/2012 0530   BILITOT 0.2* 04/12/2012 0530   GFRNONAA 85* 04/12/2012 0530   GFRAA >90 04/12/2012 0530   Lab Results  Component Value Date   HGBA1C  Value: 12.1 (NOTE)                                                                       According to the ADA Clinical Practice Recommendations for 2011, when HbA1c is used as a screening test:   >=6.5%   Diagnostic of Diabetes Mellitus           (if abnormal result  is confirmed)  5.7-6.4%   Increased risk of developing Diabetes Mellitus  References:Diagnosis and Classification of Diabetes Mellitus,Diabetes Care,2011,34(Suppl 1):S62-S69 and Standards of Medical Care in         Diabetes - 2011,Diabetes Care,2011,34  (Suppl 1):S11-S61.* 04/18/2010     Intake/Output Summary (Last 24 hours) at 04/12/12 1244  Last data filed at 04/12/12 1100  Gross per 24 hour  Intake      0 ml  Output      0 ml  Net      0 ml     Diet Order: Carb Control  Supplements/Tube Feeding: none  IVF:    sodium chloride Last Rate: 125 mL/hr at 04/12/12 0518  DISCONTD: sodium chloride     Estimated Nutritional Needs:   Kcal: 2300-2500 Protein: 100-120 gm  Fluid:  2.3 - 2.5 L  Pt states that sometimes he eats "like a horse" and other times he as no appetite. Very picky eater, only eats beef and carbs per family report. When asked about a multivitamin family stated he refused it this morning. Last A1c is from 2011, would consider drawing a new one to determine if he has been more compliant. Pt also has diabetic ulcer on foot, full thickness wound. Pt states he sometimes tries to drink Special K protein shakes for increased protein for healing.  Per pts report he has lost almost 7% body weight in the last few weeks, weight documentation does not support this. More like 3% in one month, not significant.   NUTRITION DIAGNOSIS: -Increased nutrient needs-protein (NI-5.1).  Status: Ongoing  RELATED TO: needs for healing  AS EVIDENCE BY: chronic diabetic ulcer with full thickness losses  on foot  MONITORING/EVALUATION(Goals): Goal: PO intake of protein will meet need to promote healing Monitor: PO intake, weight, labs, I/O's  EDUCATION NEEDS: -No education needs identified at this time  INTERVENTION: 1. Will add Ensure daily for increased protein intake (strawberry flavor only per pt request) 2. RD will follow  Dietitian [phone]  DOCUMENTATION CODES Per approved criteria  -Not Applicable    Derrick [name] 04/12/2012, 11:57 AM

## 2012-04-12 NOTE — Progress Notes (Signed)
*  PRELIMINARY RESULTS* Vascular Ultrasound Carotid Duplex (Doppler) has been completed.  No evidence of internal carotid artery stenosis bilaterally. Bilateral antegrade vertebral artery flow.  Malachy Moan, RDMS, RDCS 04/12/2012, 2:12 PM

## 2012-04-12 NOTE — ED Notes (Signed)
Pt states he had been bowling and playing softball today, ate some grilled chicken when he returned home.  Felt tired and went to walk to the bedroom when he felt weak and fell to the floor.  States no LOC-remembers incident.  After falling to floor, pt states he felt nauseous from the chicken and vomited x 1.  Denies n/v for EMS and presently.  Alert and oriented x 3, MAE.

## 2012-04-12 NOTE — Progress Notes (Signed)
Pt mostly noncompliant today with meds and care instructions.  Refused evening meds.  Dr Isidoro Donning is aware of incidents.   Amanda Pea, Charity fundraiser.

## 2012-04-12 NOTE — Progress Notes (Signed)
ANTIBIOTIC CONSULT NOTE - INITIAL  Pharmacy Consult for vancomcyin Indication: foot ulcer  No Known Allergies  Patient Measurements: Height: 6\' 1"  (185.4 cm) Weight: 213 lb 10 oz (96.9 kg) IBW/kg (Calculated) : 79.9   Vital Signs: Temp: 98.1 F (36.7 C) (04/26 0432) Temp src: Oral (04/26 0432) BP: 131/81 mmHg (04/26 0432) Pulse Rate: 96  (04/26 0432)  Labs:  Basename 04/12/12 0104 04/12/12 0100  WBC -- 27.1*  HGB 15.0 14.3  PLT -- 327  LABCREA -- --  CREATININE 1.20 --   Estimated Creatinine Clearance: 101.4 ml/min (by C-G formula based on Cr of 1.2).  Microbiology: No results found for this or any previous visit (from the past 720 hour(s)).  Medical History: Past Medical History  Diagnosis Date  . Diabetes mellitus   . Prostatitis     Medications:  Prescriptions prior to admission  Medication Sig Dispense Refill  . ciprofloxacin (CIPRO) 500 MG tablet Take 500 mg by mouth 2 (two) times daily.      Marland Kitchen glyBURIDE (DIABETA) 5 MG tablet Take 1 tablet (5 mg total) by mouth 2 (two) times daily with a meal.  60 tablet  0  . metFORMIN (GLUCOPHAGE) 500 MG tablet Take 1 tablet (500 mg total) by mouth 2 (two) times daily with a meal.  60 tablet  1  . meloxicam (MOBIC) 15 MG tablet Take 1 tablet (15 mg total) by mouth daily.  20 tablet  0  . oxyCODONE-acetaminophen (PERCOCET) 5-325 MG per tablet Take 2 tablets by mouth every 4 (four) hours as needed for pain.  6 tablet  0   Scheduled:    . albuterol  2.5 mg Nebulization Q6H  . ciprofloxacin  500 mg Oral BID  . enoxaparin  40 mg Subcutaneous Q24H  . glyBURIDE  5 mg Oral BID WC  . guaiFENesin  600 mg Oral BID  . insulin aspart  0-9 Units Subcutaneous TID WC  . insulin aspart  8 Units Intravenous Once  . ipratropium  0.5 mg Nebulization Q6H  . meloxicam  15 mg Oral Daily  . piperacillin-tazobactam (ZOSYN)  IV  3.375 g Intravenous Once  . sodium chloride  1,000 mL Intravenous Once  . sodium chloride  3 mL Intravenous Q12H    . vancomycin  1,000 mg Intravenous Once   Assessment: 41yo male admitted s/p syncopal episode for neuro/cardiac eval, found with leukocytosis likely due to foot ulcer that has been treated as outpt with Cipro and weekly visits to wound center; given elevated WBC and h/o MRSA adding IV ABX during admission.  Goal of Therapy:  Vanc trough 10-20  Plan:  Rec'd vanc 1g in ED; will continue with vancomycin 1000mg  IV Q8H and monitor CBC, Cx, levels prn.  Colleen Can PharmD BCPS 04/12/2012,5:12 AM

## 2012-04-12 NOTE — Progress Notes (Signed)
04/12/2012 Ammy Lienhard SPAR[facility] Case Management Note [phone]  Utilization review completed.  

## 2012-04-13 LAB — URINE CULTURE: Culture: NO GROWTH

## 2012-04-15 LAB — WOUND CULTURE: Gram Stain: NONE SEEN

## 2012-04-17 ENCOUNTER — Encounter (HOSPITAL_BASED_OUTPATIENT_CLINIC_OR_DEPARTMENT_OTHER): Payer: PRIVATE HEALTH INSURANCE | Attending: Plastic Surgery

## 2012-04-17 DIAGNOSIS — L97509 Non-pressure chronic ulcer of other part of unspecified foot with unspecified severity: Secondary | ICD-10-CM | POA: Insufficient documentation

## 2012-04-17 DIAGNOSIS — G589 Mononeuropathy, unspecified: Secondary | ICD-10-CM | POA: Insufficient documentation

## 2012-04-17 DIAGNOSIS — E1169 Type 2 diabetes mellitus with other specified complication: Secondary | ICD-10-CM | POA: Insufficient documentation

## 2012-04-17 LAB — GLUCOSE, CAPILLARY: Glucose-Capillary: 285 mg/dL — ABNORMAL HIGH (ref 70–99)

## 2012-04-17 NOTE — Progress Notes (Signed)
[facility]  NAME:  KEONTAE, LEVINGSTON              ACCOUNT NO.:  [id number]  MEDICAL RECORD NO.:  [id number]      DATE OF BIRTH:  01-08-1971  PHYSICIAN:  Wayland Denis, DO       VISIT DATE:  04/17/2012                                  OFFICE VISIT   Derrick [name] is a 41 year old white gentleman, here with his wife for a followup on his right lower extremity diabetic foot ulcer.  It actually looks a little bit better today than it did before.  He has not been controlling his blood sugars very well unfortunately and has not been staying off of it.  We had a long discussion about the importance of diabetic blood sugar control and offloading.  He is worried about going into a cast.  With a little bit of the improvement, we can wait and re- evaluate it next week.  There has been no change in his medications. Review of systems is, otherwise, negative.  He is still having neuropathy and we have recommended that he talk with his primary care physician about getting back on the Neurontin that he had been placed on after he was discharged and that did help.  On exam, he is alert, oriented, cooperative, not in any acute distress. He is pleasant.  Pupils are equal.  Extraocular muscles are intact.  No cervical lymphadenopathy.  Breathing unlabored.  Heart is regular. Abdomen is soft.  Lower extremity wound is as described above.  Overall skin condition is good to that leg and pulses present.  We will continue with Silvercel and have him follow up in 1 week.     Wayland Denis, DO     CS/MEDQ  D:  04/17/2012  T:  04/17/2012  Job:  [id number]

## 2012-04-19 ENCOUNTER — Encounter (HOSPITAL_COMMUNITY): Payer: Self-pay | Admitting: Emergency Medicine

## 2012-04-19 ENCOUNTER — Emergency Department (HOSPITAL_COMMUNITY)
Admission: EM | Admit: 2012-04-19 | Discharge: 2012-04-19 | Disposition: A | Discharge status: Home / Self Care | Payer: PRIVATE HEALTH INSURANCE | Attending: Emergency Medicine | Admitting: Emergency Medicine

## 2012-04-19 DIAGNOSIS — Z9119 Patient's noncompliance with other medical treatment and regimen: Secondary | ICD-10-CM | POA: Insufficient documentation

## 2012-04-19 DIAGNOSIS — Z79899 Other long term (current) drug therapy: Secondary | ICD-10-CM | POA: Insufficient documentation

## 2012-04-19 DIAGNOSIS — D72829 Elevated white blood cell count, unspecified: Secondary | ICD-10-CM | POA: Insufficient documentation

## 2012-04-19 DIAGNOSIS — Z91199 Patient's noncompliance with other medical treatment and regimen due to unspecified reason: Secondary | ICD-10-CM | POA: Insufficient documentation

## 2012-04-19 DIAGNOSIS — L97509 Non-pressure chronic ulcer of other part of unspecified foot with unspecified severity: Secondary | ICD-10-CM | POA: Insufficient documentation

## 2012-04-19 DIAGNOSIS — R6889 Other general symptoms and signs: Secondary | ICD-10-CM | POA: Insufficient documentation

## 2012-04-19 DIAGNOSIS — E119 Type 2 diabetes mellitus without complications: Secondary | ICD-10-CM | POA: Insufficient documentation

## 2012-04-19 LAB — URINE MICROSCOPIC-ADD ON

## 2012-04-19 LAB — CBC
MCH: 30 pg (ref 26.0–34.0)
MCHC: 35.3 g/dL (ref 30.0–36.0)
Platelets: 344 10*3/uL (ref 150–400)
RBC: 4.76 MIL/uL (ref 4.22–5.81)

## 2012-04-19 LAB — BASIC METABOLIC PANEL
GFR calc Af Amer: >90 mL/min (ref >90)
GFR calc non Af Amer: >90 mL/min (ref >90)
Potassium: 3.9 mEq/L (ref 3.5–5.1)
Sodium: 132 mEq/L — ABNORMAL LOW (ref 135–145)

## 2012-04-19 LAB — URINALYSIS, ROUTINE W REFLEX MICROSCOPIC
Bilirubin Urine: NEGATIVE
Nitrite: NEGATIVE
Specific Gravity, Urine: 1.03 (ref 1.005–1.030)
Urobilinogen, UA: 0.2 mg/dL (ref 0.0–1.0)

## 2012-04-19 LAB — DIFFERENTIAL
Basophils Relative: 0 % (ref 0–1)
Eosinophils Absolute: 0.3 10*3/uL (ref 0.0–0.7)
Neutrophils Relative %: 70 % (ref 43–77)

## 2012-04-19 MED ORDER — HYDROCODONE-ACETAMINOPHEN 5-325 MG PO TABS
1.0000 | ORAL_TABLET | Freq: Once | ORAL | Status: AC
  Administered 2012-04-19: 1 via ORAL

## 2012-04-19 MED ORDER — HYDROCODONE-ACETAMINOPHEN 5-325 MG PO TABS
ORAL_TABLET | ORAL | Status: AC
  Administered 2012-04-19: 1 via ORAL
  Filled 2012-04-19: qty 1

## 2012-04-19 MED ORDER — HYDROMORPHONE HCL PF 1 MG/ML IJ SOLN: 0.5000 mg | Freq: Once | INTRAMUSCULAR | Status: DC

## 2012-04-19 MED ORDER — SODIUM CHLORIDE 0.9 % IV SOLN
Freq: Once | INTRAVENOUS | Status: AC
  Administered 2012-04-19: 17:00:00 via INTRAVENOUS

## 2012-04-19 MED ORDER — HYDROCODONE-ACETAMINOPHEN 5-325 MG PO TABS: 1.0000 | ORAL_TABLET | ORAL | Status: AC | PRN

## 2012-04-19 MED ORDER — SODIUM CHLORIDE 0.9 % IV BOLUS (SEPSIS)
1000.0000 mL | Freq: Once | INTRAVENOUS | Status: AC
  Administered 2012-04-19: 1000 mL via INTRAVENOUS

## 2012-04-19 NOTE — ED Notes (Signed)
Pt very unhappy person.  He refused to let the lab get his blood he wanted in with the iv.  Would only let me stick him below the a-c he wanted it in his rt forearm.  Then complained that he had never had an iv in that area before.  Unable to get alll  His blood ordered.  Lab came back to draw the rest of his blood.  Unhappy about every task attempted

## 2012-04-19 NOTE — ED Notes (Signed)
The pts pain is totally gone 

## 2012-04-19 NOTE — ED Notes (Signed)
The pt remains unhappy demanding to see the pa that saw him.  Stating "get her back in here'.

## 2012-04-19 NOTE — ED Provider Notes (Signed)
History     CSN: [id number]  Arrival date & time 04/19/12  1329   First MD Initiated Contact with Patient 04/19/12 1510      Chief Complaint  Patient presents with  . Abnormal Lab    (Consider location/radiation/quality/duration/timing/severity/associated sxs/prior treatment) Patient is a 41 y.o. male presenting with weakness. The history is provided by the patient.  Weakness Primary symptoms do not include fever, nausea or vomiting. Primary symptoms comment: He complains of feeling weak and "bad" since leaving the hospital a week ago.  Additional symptoms include weakness. Associated symptoms comments: He has a history of diabetes, poorly controlled, with right foot ulceration [facility]. He was hospitalized one week ago with complaint of malaise, nonhealing ulceration and leukocytosis (27000). He went home same day as admission with follow up arranged with the [facility], which he has done. He reports now having pain in the right lower extremity from ankle to thigh without redness or swelling. No fever. No vomiting. 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 .    Past Medical History  Diagnosis Date  . Diabetes mellitus   . Prostatitis     Past Surgical History  Procedure Date  . Toe amputation     multiple toes amputated.    History reviewed. No pertinent family history.  History  Substance Use Topics  . Smoking status: Current Everyday Smoker -- 2.0 packs/day for 23 years    Types: Cigarettes  . Smokeless tobacco: Never Used  . Alcohol Use: No      Review of Systems  Constitutional: Positive for chills. Negative for fever and appetite change.  Respiratory: Negative for cough and shortness of breath.   Cardiovascular: Negative for chest pain.  Gastrointestinal: Negative for nausea, vomiting and abdominal pain.  Genitourinary: Negative for dysuria.  Musculoskeletal:       See HPI.    Neurological: Positive for weakness.    Allergies  Review of patient's allergies indicates no known allergies.  Home Medications   Current Outpatient Rx  Name Route Sig Dispense Refill  . CIPROFLOXACIN HCL 500 MG PO TABS Oral Take 1 tablet (500 mg total) by mouth 2 (two) times daily. X 2 weeks 28 tablet 0  . GABAPENTIN 300 MG PO CAPS Oral Take 1 capsule (300 mg total) by mouth 2 (two) times daily. For neuropathy 60 capsule 3  . GLYBURIDE 5 MG PO TABS Oral Take 1 tablet (5 mg total) by mouth 2 (two) times daily with a meal. 60 tablet 0  . IBUPROFEN 200 MG PO TABS Oral Take 400 mg by mouth every 6 (six) hours as needed. For pain    . METFORMIN HCL 500 MG PO TABS Oral Take 1 tablet (500 mg total) by mouth 2 (two) times daily with a meal. 60 tablet 1  . OXYCODONE-ACETAMINOPHEN 5-325 MG PO TABS Oral Take 1 tablet by mouth every 4 (four) hours as needed. For pain    . PROMETHAZINE HCL 12.5 MG PO TABS Oral Take 1 tablet (12.5 mg total) by mouth every 6 (six) hours as needed for nausea. 30 tablet 0    BP 147/91  Pulse 104  Temp(Src) 98.1 F (36.7 C) (Oral)  Resp 16  SpO2 98%  Physical Exam  Constitutional: He appears well-developed and well-nourished.  HENT:  Head: Normocephalic.  Mouth/Throat: Oropharynx is clear and moist.  Neck: Normal range of motion. Neck supple.  Cardiovascular: Normal rate and regular  rhythm.   Pulmonary/Chest: Effort normal and breath sounds normal.  Abdominal: Soft. Bowel sounds are normal. There is no tenderness. There is no rebound and no guarding.  Genitourinary: Penis normal.       No testicular tenderness, no scrotal swelling.   Musculoskeletal: Normal range of motion.       Toe amputations bilateral feet. Nonpurulent ulceration to plantar surface at 5th MTP. No redness or swelling. Right lower extremity is unremarkable in appearance. Mild tenderness to medial proximal thigh. No redness, swelling, warmth to touch. FROM. Pulses 2+.   Neurological: He is  alert. No cranial nerve deficit.  Skin: Skin is warm and dry. No rash noted.  Psychiatric: He has a normal mood and affect.    ED Course  Procedures (including critical care time)  Labs Reviewed  CBC - Abnormal; Notable for the following:    WBC 20.4 (*)    All other components within normal limits  DIFFERENTIAL - Abnormal; Notable for the following:    Neutro Abs 14.3 (*)    Monocytes Absolute 2.1 (*)    All other components within normal limits  BASIC METABOLIC PANEL - Abnormal; Notable for the following:    Sodium 132 (*)    Chloride 93 (*)    Glucose, Bld 321 (*)    All other components within normal limits  URINALYSIS, ROUTINE W REFLEX MICROSCOPIC - Abnormal; Notable for the following:    Glucose, UA >1000 (*)    Hgb urine dipstick SMALL (*)    Protein, ur 30 (*)    All other components within normal limits  URINE MICROSCOPIC-ADD ON   No results found.   No diagnosis found.  1. DM, poorly controlled 2. Medication noncompliance 3. Foot ulceration.  MDM  Leukocytosis present but improving. Patient is NAD, VSS. Do not see a reason for admission. Will discharge home with Vicodin for pain, limited quantity. Social Worker paged to him at his request for resources on medical coverage. Resources provided.        Rodena Medin, PA-C 04/19/12 [phone]

## 2012-04-19 NOTE — ED Notes (Signed)
Just before getting the vicodin  The pt was threatening to get his wife to being his pain med from home so he could get some pain relief

## 2012-04-19 NOTE — ED Notes (Signed)
Spoke with pt re: applying for Medicaid/his pending disability claim.  Advised pt to go to [employer]. DSS and apply, but reminded him that he probably wouldn't get medicaid until his disability was approved.  Pt familiar with [facility] and knows how to apply for that program.  CSW also given info on [employer], needymeds.com and the MAP program.

## 2012-04-19 NOTE — ED Notes (Signed)
Pt sent here for increased WBC and c/o nausea and generalized weakness

## 2012-04-19 NOTE — ED Notes (Signed)
The pt is c/o pain in his rt inner thigh.  He only wants something for pain.  None ordered yet.  He prefers vicodin

## 2012-04-19 NOTE — Discharge Instructions (Signed)
FOLLOW UP WITH THE [facility] FOR RECHECK AS SCHEDULED. TAKE NORCO FOR PAIN. FOLLOW UP WITH YOUR DOCTOR FOR RECHECK OF IMPROVING BLOODWORK AND RETURN HERE AS NEEDED.

## 2012-04-20 NOTE — ED Provider Notes (Signed)
Medical screening examination/treatment/procedure(s) were performed by non-physician practitioner and as supervising physician I was immediately available for consultation/collaboration.  Taurus Willis R. Asees Manfredi, MD 04/20/12 2340 

## 2012-04-26 NOTE — Progress Notes (Signed)
[facility]  NAME:  Derrick [name], Derrick [name]                   ACCOUNT NO.:  MEDICAL RECORD NO.:  [id number]      DATE OF BIRTH:  06-Aug-1971  PHYSICIAN:  Wayland Denis, DO       VISIT DATE:  04/24/2012                                  OFFICE VISIT   The patient is a 41 year old gentleman who is here for followup on his right lower extremity ulcer.  The ulcer is actually looking better.  He has been using Silvercel with good improvement in the periwound area and in the actual wound.  It is decreased in size and depth, and the redness and swelling has improved. Unfortunately, he complains of feeling very ill, feverish, shaky, flu-like symptoms.  He said he recently had an increased white count and had to be admitted with IV antibiotics, but they are not sure what is going on.  He does have an appointment with his primary care physician today, so he states that he will call and let us know.  There has been no other change in his medications or social history.  On exam, he is alert, oriented, cooperative, not in any acute distress. He does feel a little cold to touch, but not feverish.  The wound is as described.  His pupils are equal.  Extraocular muscles are intact.  No cervical lymphadenopathy.  His breathing is unlabored.  His heart is regular.  His abdomen is soft.  His pulses are present.  We will continue with the Silvercel, elevation, and offload, and have him follow up.     Wayland Denis, DO     CS/MEDQ  D:  04/24/2012  T:  04/24/2012  Job:  [id number]

## 2012-05-20 ENCOUNTER — Encounter (HOSPITAL_BASED_OUTPATIENT_CLINIC_OR_DEPARTMENT_OTHER): Payer: Medicaid Other | Attending: Plastic Surgery

## 2012-05-20 DIAGNOSIS — L97809 Non-pressure chronic ulcer of other part of unspecified lower leg with unspecified severity: Secondary | ICD-10-CM | POA: Insufficient documentation

## 2012-05-20 NOTE — Progress Notes (Signed)
[facility]  NAME:  Derrick [name], Derrick [name]              ACCOUNT NO.:  [id number]  MEDICAL RECORD NO.:  [id number]      DATE OF BIRTH:  1971/03/27  PHYSICIAN:  Wayland Denis, DO       VISIT DATE:  05/20/2012                                  OFFICE VISIT   The patient is a 41 year old gentleman who is here for followup on his right lower extremity ulcer.  He has had this for several months now. We have been using Silvercel.  It does not appear to be infected and overall it looks good but very small progress and very slow.  He is still on the foot quite a bit and he is still smoking unfortunately.  It does not appear that he is wearing the boot and he certainly did not bring it in today.  He is in his sneakers.  PHYSICAL EXAMINATION:  GENERAL:  He is alert, oriented, cooperative, not in any acute distress.  He is pleasant.  He does smell of tobacco and smoke.  His breathing is unlabored.  HEART:  Regular. ABDOMEN:  Soft. EXTREMITIES:  His pulses are strong and equal bilaterally.  There has been no change in medications or review of systems No change in social history.  He is wanting disability papers filled out.  We have not received.  Recommend continue with the Silvercel and to see him back in 3 weeks.  If there is no change at that time, then recommend total noncontact boot which at this point he has refused.  I do think he would benefit from surgery and ACell placement, however, with his smoking that is going to be a deterrent and he needs to stop smoking before we can do that, and I have expressed this to him.     Wayland Denis, DO     CS/MEDQ  D:  05/20/2012  T:  05/20/2012  Job:  [id number]

## 2012-05-22 ENCOUNTER — Encounter (HOSPITAL_BASED_OUTPATIENT_CLINIC_OR_DEPARTMENT_OTHER): Payer: PRIVATE HEALTH INSURANCE

## 2012-05-29 ENCOUNTER — Inpatient Hospital Stay (HOSPITAL_COMMUNITY)
Admission: EM | Admit: 2012-05-29 | Discharge: 2012-06-03 | DRG: 617 | Disposition: A | Discharge status: Home / Self Care | Payer: Medicaid Other | Source: Ambulatory Visit | Attending: Internal Medicine | Admitting: Internal Medicine

## 2012-05-29 ENCOUNTER — Emergency Department (HOSPITAL_COMMUNITY): Payer: Medicaid Other

## 2012-05-29 ENCOUNTER — Encounter (HOSPITAL_COMMUNITY): Payer: Self-pay | Admitting: *Deleted

## 2012-05-29 DIAGNOSIS — M86679 Other chronic osteomyelitis, unspecified ankle and foot: Secondary | ICD-10-CM | POA: Diagnosis present

## 2012-05-29 DIAGNOSIS — L03119 Cellulitis of unspecified part of limb: Secondary | ICD-10-CM

## 2012-05-29 DIAGNOSIS — F17200 Nicotine dependence, unspecified, uncomplicated: Secondary | ICD-10-CM | POA: Diagnosis present

## 2012-05-29 DIAGNOSIS — Z9119 Patient's noncompliance with other medical treatment and regimen: Secondary | ICD-10-CM

## 2012-05-29 DIAGNOSIS — L0390 Cellulitis, unspecified: Secondary | ICD-10-CM

## 2012-05-29 DIAGNOSIS — E1165 Type 2 diabetes mellitus with hyperglycemia: Secondary | ICD-10-CM

## 2012-05-29 DIAGNOSIS — M9080 Osteopathy in diseases classified elsewhere, unspecified site: Secondary | ICD-10-CM | POA: Diagnosis present

## 2012-05-29 DIAGNOSIS — Z91199 Patient's noncompliance with other medical treatment and regimen due to unspecified reason: Secondary | ICD-10-CM

## 2012-05-29 DIAGNOSIS — IMO0002 Reserved for concepts with insufficient information to code with codable children: Principal | ICD-10-CM | POA: Diagnosis present

## 2012-05-29 DIAGNOSIS — S98139A Complete traumatic amputation of one unspecified lesser toe, initial encounter: Secondary | ICD-10-CM

## 2012-05-29 DIAGNOSIS — L02419 Cutaneous abscess of limb, unspecified: Secondary | ICD-10-CM

## 2012-05-29 DIAGNOSIS — D72829 Elevated white blood cell count, unspecified: Secondary | ICD-10-CM | POA: Diagnosis present

## 2012-05-29 DIAGNOSIS — L97509 Non-pressure chronic ulcer of other part of unspecified foot with unspecified severity: Secondary | ICD-10-CM | POA: Diagnosis present

## 2012-05-29 DIAGNOSIS — E11621 Type 2 diabetes mellitus with foot ulcer: Secondary | ICD-10-CM | POA: Diagnosis present

## 2012-05-29 DIAGNOSIS — E118 Type 2 diabetes mellitus with unspecified complications: Secondary | ICD-10-CM | POA: Diagnosis present

## 2012-05-29 DIAGNOSIS — Z720 Tobacco use: Secondary | ICD-10-CM | POA: Diagnosis present

## 2012-05-29 DIAGNOSIS — E876 Hypokalemia: Secondary | ICD-10-CM | POA: Diagnosis present

## 2012-05-29 LAB — DIFFERENTIAL
Basophils Relative: 0 % (ref 0–1)
Eosinophils Absolute: 0.1 10*3/uL (ref 0.0–0.7)
Monocytes Relative: 9 % (ref 3–12)
Neutrophils Relative %: 80 % — ABNORMAL HIGH (ref 43–77)

## 2012-05-29 LAB — CBC
Hemoglobin: 15.2 g/dL (ref 13.0–17.0)
MCH: 29.8 pg (ref 26.0–34.0)
MCHC: 35.7 g/dL (ref 30.0–36.0)

## 2012-05-29 LAB — POCT I-STAT, CHEM 8
Calcium, Ion: 1.15 mmol/L (ref 1.12–1.32)
Glucose, Bld: 141 mg/dL — ABNORMAL HIGH (ref 70–99)
HCT: 47 % (ref 39.0–52.0)
Hemoglobin: 16 g/dL (ref 13.0–17.0)
Potassium: 3.2 mEq/L — ABNORMAL LOW (ref 3.5–5.1)
TCO2: 25 mmol/L (ref 0–100)

## 2012-05-29 MED ORDER — PIPERACILLIN-TAZOBACTAM 3.375 G IVPB 30 MIN
3.3750 g | Freq: Once | INTRAVENOUS | Status: AC
  Administered 2012-05-30: 3.375 g via INTRAVENOUS
  Filled 2012-05-29: qty 50

## 2012-05-29 MED ORDER — FENTANYL CITRATE 0.05 MG/ML IJ SOLN
50.0000 ug | Freq: Once | INTRAMUSCULAR | Status: AC
  Administered 2012-05-30: 50 ug via INTRAVENOUS
  Filled 2012-05-29: qty 2

## 2012-05-29 MED ORDER — VANCOMYCIN HCL IN DEXTROSE 1-5 GM/200ML-% IV SOLN
1000.0000 mg | Freq: Once | INTRAVENOUS | Status: AC
  Administered 2012-05-30: 1000 mg via INTRAVENOUS
  Filled 2012-05-29: qty 200

## 2012-05-29 NOTE — ED Notes (Signed)
Pt c/o swelling in right foot for two days.  At 1800 hr after taking a shower pt noted 1/2 dollar size area of blackness to lateral aspect of right foot.  Red ness extends to midcalf.  States pain 10/10 to right foot.  "It hurts bad"

## 2012-05-29 NOTE — ED Notes (Signed)
Pt c/o right foot swelling x 3 days, noticed "black spot" on right foot tonight.  Hx of diabetes, no foot numbness.

## 2012-05-29 NOTE — ED Provider Notes (Signed)
History     CSN: [id number]  Arrival date & time 05/29/12  1943   First MD Initiated Contact with Patient 05/29/12 2301      Chief Complaint  Patient presents with  . Foot Swelling    (Consider location/radiation/quality/duration/timing/severity/associated sxs/prior treatment) Patient is a 41 y.o. male presenting with lower extremity pain. The history is provided by the patient. No language interpreter was used.  Foot Pain This is a recurrent problem. The current episode started more than 2 days ago. The problem occurs constantly. The problem has not changed since onset.Pertinent negatives include no chest pain, no abdominal pain, no headaches and no shortness of breath. Nothing aggravates the symptoms. Nothing relieves the symptoms. He has tried nothing for the symptoms. The treatment provided no relief.  Patient has a diabetic ulcer on plantar surface of the right foot and has had increased redness and swelling x 3 days and then noted an ecchymotic area on the lateral 5th MTP this evening after getting out of the shower.   Past Medical History  Diagnosis Date  . Diabetes mellitus   . Prostatitis     Past Surgical History  Procedure Date  . Toe amputation     multiple toes amputated.    History reviewed. No pertinent family history.  History  Substance Use Topics  . Smoking status: Current Everyday Smoker -- 2.0 packs/day for 23 years    Types: Cigarettes  . Smokeless tobacco: Never Used  . Alcohol Use: Yes      Review of Systems  Constitutional: Negative for fever.  Respiratory: Negative for shortness of breath.   Cardiovascular: Negative for chest pain.  Gastrointestinal: Negative for abdominal pain.  Musculoskeletal: Positive for arthralgias.  Neurological: Negative for headaches.  All other systems reviewed and are negative.    Allergies  Review of patient's allergies indicates no known allergies.  Home Medications   Current Outpatient Rx  Name Route  Sig Dispense Refill  . CIPROFLOXACIN HCL 500 MG PO TABS Oral Take 500 mg by mouth 2 (two) times daily. Start date 04/12/12; duration unknown    . GABAPENTIN 300 MG PO CAPS Oral Take 1 capsule (300 mg total) by mouth 2 (two) times daily. For neuropathy 60 capsule 3  . GLYBURIDE 5 MG PO TABS Oral Take 1 tablet (5 mg total) by mouth 2 (two) times daily with a meal. 60 tablet 0  . HYDROCODONE-ACETAMINOPHEN 5-500 MG PO TABS Oral Take 1 tablet by mouth every 6 (six) hours as needed. For pain    . IBUPROFEN 200 MG PO TABS Oral Take 400 mg by mouth every 6 (six) hours as needed. For pain    . METFORMIN HCL 500 MG PO TABS Oral Take 1 tablet (500 mg total) by mouth 2 (two) times daily with a meal. 60 tablet 1  . PROMETHAZINE HCL 12.5 MG PO TABS Oral Take 1 tablet (12.5 mg total) by mouth every 6 (six) hours as needed for nausea. 30 tablet 0    BP 140/82  Pulse 128  Temp 98.6 F (37 C) (Oral)  Resp 18  SpO2 99%  Physical Exam  Constitutional: He is oriented to person, place, and time. He appears well-developed and well-nourished. No distress.  HENT:  Head: Normocephalic and atraumatic.  Mouth/Throat: Oropharynx is clear and moist.  Eyes: Conjunctivae are normal.  Neck: Normal range of motion. Neck supple.  Cardiovascular: Tachycardia present.   Pulmonary/Chest: Effort normal and breath sounds normal. He has no wheezes. He has no  rales.  Abdominal: Soft. Bowel sounds are normal. There is no tenderness. There is no rebound and no guarding.  Musculoskeletal: Normal range of motion.  Neurological: He is alert and oriented to person, place, and time.  Skin: Skin is warm. There is erythema.       ED Course  Procedures (including critical care time)  Labs Reviewed  GLUCOSE, CAPILLARY - Abnormal; Notable for the following:    Glucose-Capillary 209 (*)     All other components within normal limits  CBC - Abnormal; Notable for the following:    WBC 30.4 (*)     All other components within normal  limits  DIFFERENTIAL - Abnormal; Notable for the following:    Neutrophils Relative 80 (*)     Neutro Abs 24.3 (*)     Lymphocytes Relative 11 (*)     Monocytes Absolute 2.6 (*)     All other components within normal limits  POCT I-STAT, CHEM 8 - Abnormal; Notable for the following:    Potassium 3.2 (*)     Glucose, Bld 141 (*)     All other components within normal limits  CULTURE, BLOOD (ROUTINE X 2)  CULTURE, BLOOD (ROUTINE X 2)   No results found.   No diagnosis found.    MDM  Plan admit        Marise Knapper K Cristofer Yaffe-Rasch, MD 05/30/12 0004

## 2012-05-29 NOTE — ED Notes (Signed)
Pt refuses to have RN start IV.  Requesting IV team.  IV team contacted for IV start.

## 2012-05-30 ENCOUNTER — Encounter (HOSPITAL_COMMUNITY): Payer: Self-pay | Admitting: Internal Medicine

## 2012-05-30 DIAGNOSIS — L03119 Cellulitis of unspecified part of limb: Secondary | ICD-10-CM

## 2012-05-30 DIAGNOSIS — Z9119 Patient's noncompliance with other medical treatment and regimen: Secondary | ICD-10-CM

## 2012-05-30 DIAGNOSIS — E1165 Type 2 diabetes mellitus with hyperglycemia: Secondary | ICD-10-CM

## 2012-05-30 DIAGNOSIS — L02419 Cutaneous abscess of limb, unspecified: Secondary | ICD-10-CM

## 2012-05-30 LAB — GLUCOSE, CAPILLARY
Glucose-Capillary: 119 mg/dL — ABNORMAL HIGH (ref 70–99)
Glucose-Capillary: 129 mg/dL — ABNORMAL HIGH (ref 70–99)
Glucose-Capillary: 142 mg/dL — ABNORMAL HIGH (ref 70–99)
Glucose-Capillary: 172 mg/dL — ABNORMAL HIGH (ref 70–99)
Glucose-Capillary: 178 mg/dL — ABNORMAL HIGH (ref 70–99)
Glucose-Capillary: 181 mg/dL — ABNORMAL HIGH (ref 70–99)
Glucose-Capillary: 194 mg/dL — ABNORMAL HIGH (ref 70–99)
Glucose-Capillary: 261 mg/dL — ABNORMAL HIGH (ref 70–99)

## 2012-05-30 LAB — BASIC METABOLIC PANEL
BUN: 10 mg/dL (ref 6–23)
Calcium: 9 mg/dL (ref 8.4–10.5)
Chloride: 99 mEq/L (ref 96–112)
Creatinine, Ser: 0.71 mg/dL (ref 0.50–1.35)
GFR calc Af Amer: >90 mL/min (ref >90)
GFR calc non Af Amer: >90 mL/min (ref >90)

## 2012-05-30 LAB — CBC
MCHC: 33.9 g/dL (ref 30.0–36.0)
Platelets: 222 10*3/uL (ref 150–400)
RDW: 14 % (ref 11.5–15.5)
WBC: 24.3 10*3/uL — ABNORMAL HIGH (ref 4.0–10.5)

## 2012-05-30 LAB — SURGICAL PCR SCREEN
MRSA, PCR: NEGATIVE
Staphylococcus aureus: NEGATIVE

## 2012-05-30 MED ORDER — DOCUSATE SODIUM 100 MG PO CAPS
100.0000 mg | ORAL_CAPSULE | Freq: Two times a day (BID) | ORAL | Status: DC
  Administered 2012-05-30 – 2012-06-03 (×6): 100 mg via ORAL
  Filled 2012-05-30 (×11): qty 1

## 2012-05-30 MED ORDER — ENOXAPARIN SODIUM 40 MG/0.4ML ~~LOC~~ SOLN
40.0000 mg | SUBCUTANEOUS | Status: DC
  Administered 2012-05-30: 40 mg via SUBCUTANEOUS
  Filled 2012-05-30: qty 0.4

## 2012-05-30 MED ORDER — ONDANSETRON HCL 4 MG/2ML IJ SOLN
4.0000 mg | Freq: Four times a day (QID) | INTRAMUSCULAR | Status: DC | PRN
  Administered 2012-05-30: 4 mg via INTRAVENOUS
  Filled 2012-05-30: qty 2

## 2012-05-30 MED ORDER — GLYBURIDE 5 MG PO TABS
5.0000 mg | ORAL_TABLET | Freq: Two times a day (BID) | ORAL | Status: DC
  Administered 2012-05-30 – 2012-06-03 (×7): 5 mg via ORAL
  Filled 2012-05-30 (×11): qty 1

## 2012-05-30 MED ORDER — ONDANSETRON HCL 4 MG PO TABS: 4.0000 mg | ORAL_TABLET | Freq: Four times a day (QID) | ORAL | Status: DC | PRN

## 2012-05-30 MED ORDER — PIPERACILLIN-TAZOBACTAM 3.375 G IVPB
3.3750 g | Freq: Three times a day (TID) | INTRAVENOUS | Status: DC
  Administered 2012-05-30 – 2012-06-03 (×13): 3.375 g via INTRAVENOUS
  Filled 2012-05-30 (×18): qty 50

## 2012-05-30 MED ORDER — ASPIRIN EC 81 MG PO TBEC
81.0000 mg | DELAYED_RELEASE_TABLET | Freq: Every day | ORAL | Status: DC
  Administered 2012-05-30 – 2012-06-03 (×4): 81 mg via ORAL
  Filled 2012-05-30 (×5): qty 1

## 2012-05-30 MED ORDER — DEXTROSE-NACL 5-0.9 % IV SOLN: INTRAVENOUS | Status: DC

## 2012-05-30 MED ORDER — POTASSIUM CHLORIDE CRYS ER 20 MEQ PO TBCR
40.0000 meq | EXTENDED_RELEASE_TABLET | Freq: Four times a day (QID) | ORAL | Status: AC
  Administered 2012-05-30 (×2): 40 meq via ORAL
  Filled 2012-05-30 (×2): qty 2

## 2012-05-30 MED ORDER — VANCOMYCIN HCL IN DEXTROSE 1-5 GM/200ML-% IV SOLN
1000.0000 mg | Freq: Three times a day (TID) | INTRAVENOUS | Status: DC
  Administered 2012-05-30 – 2012-06-03 (×14): 1000 mg via INTRAVENOUS
  Filled 2012-05-30 (×17): qty 200

## 2012-05-30 MED ORDER — CHLORHEXIDINE GLUCONATE 4 % EX LIQD
60.0000 mL | Freq: Once | CUTANEOUS | Status: DC
  Filled 2012-05-30: qty 60

## 2012-05-30 MED ORDER — HYDROMORPHONE HCL PF 1 MG/ML IJ SOLN
1.0000 mg | INTRAMUSCULAR | Status: DC | PRN
  Administered 2012-05-30 – 2012-05-31 (×8): 1 mg via INTRAVENOUS
  Filled 2012-05-30 (×8): qty 1

## 2012-05-30 MED ORDER — PIPERACILLIN-TAZOBACTAM 3.375 G IVPB 30 MIN
3.3750 g | Freq: Three times a day (TID) | INTRAVENOUS | Status: DC
  Filled 2012-05-30 (×2): qty 50

## 2012-05-30 MED ORDER — HYDROCODONE-ACETAMINOPHEN 5-325 MG PO TABS
1.0000 | ORAL_TABLET | ORAL | Status: DC | PRN
  Administered 2012-05-30: 1 via ORAL
  Filled 2012-05-30: qty 1

## 2012-05-30 MED ORDER — GABAPENTIN 300 MG PO CAPS
300.0000 mg | ORAL_CAPSULE | Freq: Two times a day (BID) | ORAL | Status: DC
  Administered 2012-05-30 – 2012-06-03 (×10): 300 mg via ORAL
  Filled 2012-05-30 (×11): qty 1

## 2012-05-30 MED ORDER — INSULIN ASPART 100 UNIT/ML ~~LOC~~ SOLN
0.0000 [IU] | SUBCUTANEOUS | Status: DC
  Administered 2012-05-30 (×3): 3 [IU] via SUBCUTANEOUS
  Administered 2012-05-30: 8 [IU] via SUBCUTANEOUS
  Administered 2012-05-31: 2 [IU] via SUBCUTANEOUS
  Administered 2012-05-31: via SUBCUTANEOUS
  Administered 2012-05-31: 5 [IU] via SUBCUTANEOUS
  Administered 2012-06-01: 3 [IU] via SUBCUTANEOUS
  Administered 2012-06-01: 5 [IU] via SUBCUTANEOUS
  Administered 2012-06-01: 8 [IU] via SUBCUTANEOUS
  Administered 2012-06-01: 3 [IU] via SUBCUTANEOUS
  Administered 2012-06-02: 2 [IU] via SUBCUTANEOUS
  Administered 2012-06-02: 8 [IU] via SUBCUTANEOUS
  Administered 2012-06-02: 5 [IU] via SUBCUTANEOUS
  Administered 2012-06-03: 8 [IU] via SUBCUTANEOUS

## 2012-05-30 NOTE — Progress Notes (Signed)
UR COMPLETED  

## 2012-05-30 NOTE — Progress Notes (Signed)
ANTIBIOTIC CONSULT NOTE - INITIAL  Pharmacy Consult for vancomycin Indication: cellulitis  No Known Allergies  Patient Measurements: Body Weight: ~95kg  Vital Signs: Temp: 99.4 F (37.4 C) (06/13 0133) Temp src: Oral (06/13 0110) BP: 136/82 mmHg (06/13 0133) Pulse Rate: 128  (06/13 0133)  Labs:  Alvira Philips 05/29/12 2247 05/29/12 2228  WBC -- 30.4*  HGB 16.0 15.2  PLT -- 225  LABCREA -- --  CREATININE 0.90 --    Microbiology: No results found for this or any previous visit (from the past 720 hour(s)).  Medical History: Past Medical History  Diagnosis Date  . Diabetes mellitus   . Prostatitis     Medications:  Prescriptions prior to admission  Medication Sig Dispense Refill  . ciprofloxacin (CIPRO) 500 MG tablet Take 500 mg by mouth 2 (two) times daily. Start date 04/12/12; duration unknown      . gabapentin (NEURONTIN) 300 MG capsule Take 1 capsule (300 mg total) by mouth 2 (two) times daily. For neuropathy  60 capsule  3  . glyBURIDE (DIABETA) 5 MG tablet Take 1 tablet (5 mg total) by mouth 2 (two) times daily with a meal.  60 tablet  0  . HYDROcodone-acetaminophen (VICODIN) 5-500 MG per tablet Take 1 tablet by mouth every 6 (six) hours as needed. For pain      . ibuprofen (ADVIL,MOTRIN) 200 MG tablet Take 400 mg by mouth every 6 (six) hours as needed. For pain      . metFORMIN (GLUCOPHAGE) 500 MG tablet Take 1 tablet (500 mg total) by mouth 2 (two) times daily with a meal.  60 tablet  1  . promethazine (PHENERGAN) 12.5 MG tablet Take 1 tablet (12.5 mg total) by mouth every 6 (six) hours as needed for nausea.  30 tablet  0   Scheduled:    . aspirin EC  81 mg Oral Daily  . docusate sodium  100 mg Oral BID  . enoxaparin  40 mg Subcutaneous Q24H  . fentaNYL  50 mcg Intravenous Once  . gabapentin  300 mg Oral BID  . glyBURIDE  5 mg Oral BID WC  . piperacillin-tazobactam  3.375 g Intravenous Once  . piperacillin-tazobactam  3.375 g Intravenous Q8H  . vancomycin   1,000 mg Intravenous Once  . vancomycin  1,000 mg Intravenous Q8H    Assessment: 41yo male c/o LE pain with increased swelling and erythema x3 days, noted to have diabetic ulcer on plantar surface of foot, to begin IV ABX for diabetic cellulitis.  Goal of Therapy:  Vancomycin trough level 10-15 mcg/ml  Plan:  Will begin vancomycin 1000mg  IV Q8H and monitor CBC, Cx, levels prn.  Colleen Can PharmD BCPS 05/30/2012,1:44 AM

## 2012-05-30 NOTE — ED Notes (Signed)
Alert, NAD, calm, interactive, skin W&D, resps e/u, speaking in clear complete sentences, family at [facility], resting with eyes closed, lying on L side.

## 2012-05-30 NOTE — ED Notes (Signed)
Pt requesting different RN.  I spoke with pt who states we got off on the wrong foot and does not want me to care for him.  Discussed with Charge Rn, JON. Report given to Theresia Bough, RN.  IV team in room at present

## 2012-05-30 NOTE — Consult Note (Signed)
Reason for Consult: Abscess with purulent draining ulcer right foot fifth metatarsal head. Referring Physician  Derrick  :[name] is an 41 y.o. male.   HPI: Patient is a 41 year old gentleman with uncontrolled diabetes with a essentially one-year history of recurrent ulcerations abscess drainage from the right foot fifth metatarsal head. Patient has had multiple Hospital admissions for the recurrent infections and presents at this time with recurrent infection after he is wearing shoes and walking on his feet for 3 days of work.    Past Medical History  Diagnosis Date  . Diabetes mellitus   . Prostatitis     Past Surgical History  Procedure Date  . Toe amputation     multiple toes amputated.    History reviewed. No pertinent family history.  Social History:  reports that he has been smoking Cigarettes.  He has a 46 pack-year smoking history. He has never used smokeless tobacco. He reports that he drinks alcohol. He reports that he does not use illicit drugs.  Allergies: No Known Allergies  Medications: I have reviewed the patient's current medications.  Results for orders placed during the hospital encounter of 05/29/12 (from the past 48 hour(s))  GLUCOSE, CAPILLARY     Status: Abnormal   Collection Time   05/29/12  9:08 PM      Component Value Range Comment   Glucose-Capillary 209 (*) 70 - 99 mg/dL   CBC     Status: Abnormal   Collection Time   05/29/12 10:28 PM      Component Value Range Comment   WBC 30.4 (*) 4.0 - 10.5 K/uL    RBC 5.10  4.22 - 5.81 MIL/uL    Hemoglobin 15.2  13.0 - 17.0 g/dL    HCT 16.1  09.6 - 04.5 %    MCV 83.5  78.0 - 100.0 fL    MCH 29.8  26.0 - 34.0 pg    MCHC 35.7  30.0 - 36.0 g/dL    RDW 40.9  81.1 - 91.4 %    Platelets 225  150 - 400 K/uL   DIFFERENTIAL     Status: Abnormal   Collection Time   05/29/12 10:28 PM      Component Value Range Comment   Neutrophils Relative 80 (*) 43 - 77 %    Neutro Abs 24.3 (*) 1.7 - 7.7 K/uL    Lymphocytes Relative 11 (*) 12 - 46 %    Lymphs Abs 3.4  0.7 - 4.0 K/uL    Monocytes Relative 9  3 - 12 %    Monocytes Absolute 2.6 (*) 0.1 - 1.0 K/uL    Eosinophils Relative 0  0 - 5 %    Eosinophils Absolute 0.1  0.0 - 0.7 K/uL    Basophils Relative 0  0 - 1 %    Basophils Absolute 0.1  0.0 - 0.1 K/uL   POCT I-STAT, CHEM 8     Status: Abnormal   Collection Time   05/29/12 10:47 PM      Component Value Range Comment   Sodium 138  135 - 145 mEq/L    Potassium 3.2 (*) 3.5 - 5.1 mEq/L    Chloride 99  96 - 112 mEq/L    BUN 15  6 - 23 mg/dL    Creatinine, Ser 7.82  0.50 - 1.35 mg/dL    Glucose, Bld 956 (*) 70 - 99 mg/dL    Calcium, Ion 2.13  0.86 - 1.32 mmol/L    TCO2 25  0 - 100 mmol/L    Hemoglobin 16.0  13.0 - 17.0 g/dL    HCT 40.9  81.1 - 91.4 %   GLUCOSE, CAPILLARY     Status: Abnormal   Collection Time   05/30/12  1:31 AM      Component Value Range Comment   Glucose-Capillary 129 (*) 70 - 99 mg/dL   GLUCOSE, CAPILLARY     Status: Abnormal   Collection Time   05/30/12  3:32 AM      Component Value Range Comment   Glucose-Capillary 119 (*) 70 - 99 mg/dL   BASIC METABOLIC PANEL     Status: Abnormal   Collection Time   05/30/12  6:53 AM      Component Value Range Comment   Sodium 136  135 - 145 mEq/L    Potassium 3.0 (*) 3.5 - 5.1 mEq/L    Chloride 99  96 - 112 mEq/L    CO2 27  19 - 32 mEq/L    Glucose, Bld 141 (*) 70 - 99 mg/dL    BUN 10  6 - 23 mg/dL    Creatinine, Ser 7.82  0.50 - 1.35 mg/dL    Calcium 9.0  8.4 - 95.6 mg/dL    GFR calc non Af Amer >90  >90 mL/min    GFR calc Af Amer >90  >90 mL/min   CBC     Status: Abnormal   Collection Time   05/30/12  6:53 AM      Component Value Range Comment   WBC 24.3 (*) 4.0 - 10.5 K/uL    RBC 4.57  4.22 - 5.81 MIL/uL    Hemoglobin 13.1  13.0 - 17.0 g/dL DELTA CHECK NOTED   HCT 38.7 (*) 39.0 - 52.0 %    MCV 84.7  78.0 - 100.0 fL    MCH 28.7  26.0 - 34.0 pg    MCHC 33.9  30.0 - 36.0 g/dL    RDW 21.3  08.6 - 57.8 %     Platelets 222  150 - 400 K/uL   GLUCOSE, CAPILLARY     Status: Abnormal   Collection Time   05/30/12  8:48 AM      Component Value Range Comment   Glucose-Capillary 181 (*) 70 - 99 mg/dL   GLUCOSE, CAPILLARY     Status: Abnormal   Collection Time   05/30/12 11:34 AM      Component Value Range Comment   Glucose-Capillary 261 (*) 70 - 99 mg/dL   GLUCOSE, CAPILLARY     Status: Abnormal   Collection Time   05/30/12  4:35 PM      Component Value Range Comment   Glucose-Capillary 194 (*) 70 - 99 mg/dL    Comment 1 Documented in Chart      Comment 2 Notify RN       Dg Foot Complete Right  05/30/2012  *RADIOLOGY REPORT*  Clinical Data: Foot pain  RIGHT FOOT COMPLETE - 3+ VIEW  Comparison: 04/12/2012  Findings: Amputation of the fourth digit at the metatarsophalangeal joint.  No acute fracture or dislocation identified.  Midfoot DJD. No radiopaque foreign body.  There is soft tissue swelling along the dorsum and lateral aspect of the foot, with a few locules of gas laterally.  Posterior and plantar calcaneal enthesopathic changes.  IMPRESSION: Soft tissue swelling along the dorsum and lateral aspect of the foot.  There are a few locules of gas.  Correlate clinically for ulcer/cellulitis.  No underlying acute osseous  finding.  If clinical concern for osteomyelitis persists, MRI follow-up recommended.  Original Report Authenticated By: Waneta Martins, M.D.    Review of Systems  All other systems reviewed and are negative.   Blood pressure 112/71, pulse 104, temperature 99.5 F (37.5 C), temperature source Oral, resp. rate 16, SpO2 97.00%. Physical Exam on examination patient has a good dorsalis pedis pulse. He has a prone abscess beneath the fifth metatarsal head with a swelling and cellulitis or ascending proximal through the midfoot.  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  Plan: We will plan for irrigation and debridement of the operating room tomorrow discussed that most likely patient will lose his fifth metatarsal with a fifth ray amputation. The importance of a glucose control was discussed with the patient proper diet was discussed. Discussed with his uncontrolled diabetes that he is at risk for further loss of limb or life without controlling his diabetes better.  Adetokunbo Mccadden V 05/30/2012, 6:36 PM

## 2012-05-30 NOTE — H&P (Signed)
PCP: None   Chief Complaint: Swelling and redness of right foot.   HPI: Derrick [name] is an 41 y.o. male with history of diabetes, status post multiple toes amputation, having a right diabetic  foot ulcer, attending clinic, presents to emergency room with increased redness, swelling, pain of his right foot. Evaluation in emergency room included a white count of 30,000, creatinine of 0.9, and blood sugar of 209. Hospitalist was asked to admit patient for treatment of diabetic cellulitis. He has been on Cipro as outpatient. He denied chest pain, shortness of breath, fever, chills, or any other symptomology.  Rewiew of Systems:  The patient denies anorexia, fever, weight loss,, vision loss, decreased hearing, hoarseness, chest pain, syncope, dyspnea on exertion, peripheral edema, balance deficits, hemoptysis, abdominal pain, melena, hematochezia, severe indigestion/heartburn, hematuria, incontinence, genital sores, muscle weakness, suspicious skin lesions, transient blindness, difficulty walking, depression, unusual weight change, abnormal bleeding, enlarged lymph nodes, angioedema, and breast masses.   Past Medical History  Diagnosis Date  . Diabetes mellitus   . Prostatitis     Past Surgical History  Procedure Date  . Toe amputation     multiple toes amputated.    Medications:  HOME MEDS: Prior to Admission medications   Medication Sig Start Date End Date Taking? Authorizing Provider  ciprofloxacin (CIPRO) 500 MG tablet Take 500 mg by mouth 2 (two) times daily. Start date 04/12/12; duration unknown 04/12/12  Yes Ripudeep K Rai, MD  gabapentin (NEURONTIN) 300 MG capsule Take 1 capsule (300 mg total) by mouth 2 (two) times daily. For neuropathy 04/12/12 04/12/13 Yes Ripudeep Jenna Luo, MD  glyBURIDE (DIABETA) 5 MG tablet Take 1 tablet (5 mg total) by mouth 2 (two) times daily with a meal. 02/04/12 02/03/13 Yes Vida Roller, MD  HYDROcodone-acetaminophen (VICODIN) 5-500 MG per tablet Take 1  tablet by mouth every 6 (six) hours as needed. For pain   Yes Historical Provider, MD  ibuprofen (ADVIL,MOTRIN) 200 MG tablet Take 400 mg by mouth every 6 (six) hours as needed. For pain   Yes Historical Provider, MD  metFORMIN (GLUCOPHAGE) 500 MG tablet Take 1 tablet (500 mg total) by mouth 2 (two) times daily with a meal. 02/04/12 02/03/13 Yes Vida Roller, MD  promethazine (PHENERGAN) 12.5 MG tablet Take 1 tablet (12.5 mg total) by mouth every 6 (six) hours as needed for nausea. 04/12/12 04/19/12  Ripudeep Jenna Luo, MD     Allergies:  No Known Allergies  Social History:   reports that he has been smoking Cigarettes.  He has a 46 pack-year smoking history. He has never used smokeless tobacco. He reports that he drinks alcohol. He reports that he does not use illicit drugs.  Family History: History reviewed. No pertinent family history.   Physical Exam: Filed Vitals:   05/29/12 1952 05/30/12 0110 05/30/12 0133  BP: 140/82 112/64 136/82  Pulse: 128 134 128  Temp: 98.6 F (37 C) 102.6 F (39.2 C) 99.4 F (37.4 C)  TempSrc: Oral Oral   Resp: 18 17 18   SpO2: 99% 95% 95%   Blood pressure 136/82, pulse 128, temperature 99.4 F (37.4 C), temperature source Oral, resp. rate 18, SpO2 95.00%.  GEN:   person lying in the stretcher in no acute distress; cooperative with exam. PSYCH:  alert and oriented x4; he has been very confrontational and his interaction. HEENT: Mucous membranes pink and anicteric; PERRLA; EOM intact; no cervical lymphadenopathy nor thyromegaly or carotid bruit; no JVD; Breasts:: Not examined CHEST WALL: No  tenderness CHEST: Normal respiration, clear to auscultation bilaterally HEART: Regular rate and rhythm; no murmurs rubs or gallops BACK: No kyphosis or scoliosis; no CVA tenderness ABDOMEN: Obese, soft non-tender; no masses, no organomegaly, normal abdominal bowel sounds; no pannus; no intertriginous candida. Rectal Exam: Not done EXTREMITIES: Right foot shows  multiple prior toe amputation. There is dark necrotic skin over the lateral aspect of his right foot. There is a ulcer on the sole as well. Genitalia: not examined PULSES: 2+ and symmetric SKIN: Normal hydration no rash or ulceration CNS: Cranial nerves 2-12 grossly intact no focal lateralizing neurologic deficit   Labs & Imaging Results for orders placed during the hospital encounter of 05/29/12 (from the past 48 hour(s))  GLUCOSE, CAPILLARY     Status: Abnormal   Collection Time   05/29/12  9:08 PM      Component Value Range Comment   Glucose-Capillary 209 (*) 70 - 99 mg/dL   CBC     Status: Abnormal   Collection Time   05/29/12 10:28 PM      Component Value Range Comment   WBC 30.4 (*) 4.0 - 10.5 K/uL    RBC 5.10  4.22 - 5.81 MIL/uL    Hemoglobin 15.2  13.0 - 17.0 g/dL    HCT 45.4  09.8 - 11.9 %    MCV 83.5  78.0 - 100.0 fL    MCH 29.8  26.0 - 34.0 pg    MCHC 35.7  30.0 - 36.0 g/dL    RDW 14.7  82.9 - 56.2 %    Platelets 225  150 - 400 K/uL   DIFFERENTIAL     Status: Abnormal   Collection Time   05/29/12 10:28 PM      Component Value Range Comment   Neutrophils Relative 80 (*) 43 - 77 %    Neutro Abs 24.3 (*) 1.7 - 7.7 K/uL    Lymphocytes Relative 11 (*) 12 - 46 %    Lymphs Abs 3.4  0.7 - 4.0 K/uL    Monocytes Relative 9  3 - 12 %    Monocytes Absolute 2.6 (*) 0.1 - 1.0 K/uL    Eosinophils Relative 0  0 - 5 %    Eosinophils Absolute 0.1  0.0 - 0.7 K/uL    Basophils Relative 0  0 - 1 %    Basophils Absolute 0.1  0.0 - 0.1 K/uL   POCT I-STAT, CHEM 8     Status: Abnormal   Collection Time   05/29/12 10:47 PM      Component Value Range Comment   Sodium 138  135 - 145 mEq/L    Potassium 3.2 (*) 3.5 - 5.1 mEq/L    Chloride 99  96 - 112 mEq/L    BUN 15  6 - 23 mg/dL    Creatinine, Ser 1.30  0.50 - 1.35 mg/dL    Glucose, Bld 865 (*) 70 - 99 mg/dL    Calcium, Ion 7.84  6.96 - 1.32 mmol/L    TCO2 25  0 - 100 mmol/L    Hemoglobin 16.0  13.0 - 17.0 g/dL    HCT 29.5  28.4 -  13.2 %   GLUCOSE, CAPILLARY     Status: Abnormal   Collection Time   05/30/12  1:31 AM      Component Value Range Comment   Glucose-Capillary 129 (*) 70 - 99 mg/dL    Dg Foot Complete Right  05/30/2012  *RADIOLOGY REPORT*  Clinical Data: Foot pain  RIGHT FOOT COMPLETE - 3+ VIEW  Comparison: 04/12/2012  Findings: Amputation of the fourth digit at the metatarsophalangeal joint.  No acute fracture or dislocation identified.  Midfoot DJD. No radiopaque foreign body.  There is soft tissue swelling along the dorsum and lateral aspect of the foot, with a few locules of gas laterally.  Posterior and plantar calcaneal enthesopathic changes.  IMPRESSION: Soft tissue swelling along the dorsum and lateral aspect of the foot.  There are a few locules of gas.  Correlate clinically for ulcer/cellulitis.  No underlying acute osseous finding.  If clinical concern for osteomyelitis persists, MRI follow-up recommended.  Original Report Authenticated By: Waneta Martins, M.D.      Assessment Present on Admission:  .Cellulitis .Diabetic toe ulcer .Tobacco abuse .Leukocytosis .Diabetes mellitus   PLAN: Will admit him for IV vancomycin and Zosyn. I had recommended n.p.o. so orthopedics can be consulted for possible surgical debridement. His x-ray of the foot did not show any evidence of osteomyelitis. For his diabetes will use insulin sliding scale. Please consult orthopedics tomorrow. He is stable, full code, and will be admitted to triad hospitalist service.   Other plans as per orders.    Cecilie Heidel 05/30/2012, 3:13 AM

## 2012-05-30 NOTE — Progress Notes (Signed)
   Patient seen and examined, data base reviewed.  Patient seen earlier today by my colleague Dr. Conley Rolls.  Right lower extremity cellulitis, likely secondary to diabetic foot.  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  Consult orthopedic on call, for recommendation regarding debridement.  Clint Lipps Pager: [phone] 05/30/2012, 12:48 PM

## 2012-05-30 NOTE — Progress Notes (Signed)
Pt very unpleasant when he arrived on unit. Complaining about everything. Refused to remain NPO even after explaining possibility of surgery in a.m and necessity to be NPO. Dr. Conley Rolls notified. Orders received to allow carb modified diet if pt continued to refuse to be npo.

## 2012-05-30 NOTE — ED Notes (Signed)
REPORT GIVEN TO 5000 FLOOR NURSE , TRANSPORTED IN STABLE CONDITION BY FLOAT NURSE , RESPIRATIONS UNLABORED , IV SITE UNREMARKABLE , VANCOMYCIN IV INFUSING .

## 2012-05-31 ENCOUNTER — Encounter (HOSPITAL_COMMUNITY): Payer: Self-pay

## 2012-05-31 ENCOUNTER — Encounter (HOSPITAL_COMMUNITY)
Admission: EM | Disposition: A | Discharge status: Home / Self Care | Payer: Self-pay | Source: Ambulatory Visit | Attending: Internal Medicine

## 2012-05-31 ENCOUNTER — Inpatient Hospital Stay (HOSPITAL_COMMUNITY): Payer: Medicaid Other

## 2012-05-31 ENCOUNTER — Encounter (HOSPITAL_COMMUNITY): Payer: Self-pay | Admitting: Anesthesiology

## 2012-05-31 DIAGNOSIS — E1165 Type 2 diabetes mellitus with hyperglycemia: Secondary | ICD-10-CM

## 2012-05-31 DIAGNOSIS — L03119 Cellulitis of unspecified part of limb: Secondary | ICD-10-CM

## 2012-05-31 DIAGNOSIS — L02419 Cutaneous abscess of limb, unspecified: Secondary | ICD-10-CM

## 2012-05-31 DIAGNOSIS — Z9119 Patient's noncompliance with other medical treatment and regimen: Secondary | ICD-10-CM

## 2012-05-31 HISTORY — PX: AMPUTATION: SHX166

## 2012-05-31 LAB — BASIC METABOLIC PANEL
CO2: 28 mEq/L (ref 19–32)
Calcium: 9.3 mg/dL (ref 8.4–10.5)
GFR calc non Af Amer: >90 mL/min (ref >90)
Glucose, Bld: 176 mg/dL — ABNORMAL HIGH (ref 70–99)
Potassium: 3.4 mEq/L — ABNORMAL LOW (ref 3.5–5.1)
Sodium: 134 mEq/L — ABNORMAL LOW (ref 135–145)

## 2012-05-31 LAB — GLUCOSE, CAPILLARY
Glucose-Capillary: 98 mg/dL (ref 70–99)
Glucose-Capillary: 92 mg/dL (ref 70–99)
Glucose-Capillary: 91 mg/dL (ref 70–99)

## 2012-05-31 LAB — CBC
Hemoglobin: 13.1 g/dL (ref 13.0–17.0)
MCH: 28.8 pg (ref 26.0–34.0)
MCV: 83.7 fL (ref 78.0–100.0)
Platelets: 233 10*3/uL (ref 150–400)
RBC: 4.55 MIL/uL (ref 4.22–5.81)
WBC: 22.4 10*3/uL — ABNORMAL HIGH (ref 4.0–10.5)

## 2012-05-31 SURGERY — AMPUTATION DIGIT
Anesthesia: General | Site: Foot | Laterality: Right | Wound class: Dirty or Infected

## 2012-05-31 MED ORDER — METOCLOPRAMIDE HCL 5 MG/ML IJ SOLN: 5.0000 mg | Freq: Three times a day (TID) | INTRAMUSCULAR | Status: DC | PRN

## 2012-05-31 MED ORDER — HYDROMORPHONE HCL PF 1 MG/ML IJ SOLN: 0.2500 mg | INTRAMUSCULAR | Status: DC | PRN

## 2012-05-31 MED ORDER — SODIUM CHLORIDE 0.9 % IV SOLN
INTRAVENOUS | Status: DC | PRN
  Administered 2012-05-31: 16:00:00 via INTRAVENOUS

## 2012-05-31 MED ORDER — HYDROMORPHONE HCL PF 1 MG/ML IJ SOLN
0.5000 mg | INTRAMUSCULAR | Status: DC | PRN
  Administered 2012-05-31 – 2012-06-02 (×8): 1 mg via INTRAVENOUS
  Filled 2012-05-31 (×8): qty 1

## 2012-05-31 MED ORDER — OXYCODONE-ACETAMINOPHEN 5-325 MG PO TABS
1.0000 | ORAL_TABLET | ORAL | Status: DC | PRN
  Administered 2012-06-01 – 2012-06-02 (×4): 2 via ORAL
  Filled 2012-05-31 (×4): qty 2

## 2012-05-31 MED ORDER — WARFARIN SODIUM 10 MG PO TABS
10.0000 mg | ORAL_TABLET | Freq: Once | ORAL | Status: AC
  Administered 2012-05-31: 10 mg via ORAL
  Filled 2012-05-31: qty 1

## 2012-05-31 MED ORDER — WARFARIN VIDEO
Freq: Once | Status: AC
  Administered 2012-05-31: 20:00:00

## 2012-05-31 MED ORDER — METOCLOPRAMIDE HCL 10 MG PO TABS: 5.0000 mg | ORAL_TABLET | Freq: Three times a day (TID) | ORAL | Status: DC | PRN

## 2012-05-31 MED ORDER — COUMADIN BOOK
Freq: Once | Status: AC
  Administered 2012-05-31: 20:00:00
  Filled 2012-05-31: qty 1

## 2012-05-31 MED ORDER — MIDAZOLAM HCL 2 MG/2ML IJ SOLN: 1.0000 mg | INTRAMUSCULAR | Status: DC | PRN

## 2012-05-31 MED ORDER — WARFARIN - PHARMACIST DOSING INPATIENT
Freq: Every day | Status: DC
  Administered 2012-06-01: 18:00:00

## 2012-05-31 MED ORDER — ONDANSETRON HCL 4 MG/2ML IJ SOLN: 4.0000 mg | Freq: Four times a day (QID) | INTRAMUSCULAR | Status: DC | PRN

## 2012-05-31 MED ORDER — ONDANSETRON HCL 4 MG/2ML IJ SOLN
INTRAMUSCULAR | Status: DC | PRN
  Administered 2012-05-31: 4 mg via INTRAVENOUS

## 2012-05-31 MED ORDER — ONDANSETRON HCL 4 MG PO TABS: 4.0000 mg | ORAL_TABLET | Freq: Four times a day (QID) | ORAL | Status: DC | PRN

## 2012-05-31 MED ORDER — MIDAZOLAM HCL 5 MG/5ML IJ SOLN
INTRAMUSCULAR | Status: DC | PRN
  Administered 2012-05-31: 2 mg via INTRAVENOUS

## 2012-05-31 MED ORDER — 0.9 % SODIUM CHLORIDE (POUR BTL) OPTIME
TOPICAL | Status: DC | PRN
  Administered 2012-05-31: 1000 mL

## 2012-05-31 MED ORDER — LORAZEPAM 2 MG/ML IJ SOLN: 1.0000 mg | Freq: Once | INTRAMUSCULAR | Status: DC | PRN

## 2012-05-31 MED ORDER — SODIUM CHLORIDE 0.9 % IV SOLN
INTRAVENOUS | Status: DC
  Administered 2012-05-31: 19:00:00 via INTRAVENOUS

## 2012-05-31 MED ORDER — PROPOFOL 10 MG/ML IV BOLUS
INTRAVENOUS | Status: DC | PRN
  Administered 2012-05-31: 200 mg via INTRAVENOUS

## 2012-05-31 MED ORDER — PHENYLEPHRINE HCL 10 MG/ML IJ SOLN
INTRAMUSCULAR | Status: DC | PRN
  Administered 2012-05-31: 120 ug via INTRAVENOUS

## 2012-05-31 MED ORDER — LIDOCAINE HCL (CARDIAC) 20 MG/ML IV SOLN
INTRAVENOUS | Status: DC | PRN
  Administered 2012-05-31: 100 mg via INTRAVENOUS

## 2012-05-31 MED ORDER — FENTANYL CITRATE 0.05 MG/ML IJ SOLN
50.0000 ug | INTRAMUSCULAR | Status: DC | PRN
Start: 2012-05-31 — End: 2012-05-31

## 2012-05-31 MED ORDER — FENTANYL CITRATE 0.05 MG/ML IJ SOLN
INTRAMUSCULAR | Status: DC | PRN
  Administered 2012-05-31 (×2): 25 ug via INTRAVENOUS

## 2012-05-31 SURGICAL SUPPLY — 50 items
BANDAGE GAUZE 4  KLING STR (GAUZE/BANDAGES/DRESSINGS) IMPLANT
BANDAGE GAUZE ELAST BULKY 4 IN (GAUZE/BANDAGES/DRESSINGS) ×1 IMPLANT
BLADE AVERAGE 25X9 (BLADE) IMPLANT
BLADE MINI RND TIP GREEN BEAV (BLADE) IMPLANT
BNDG CMPR 9X4 STRL LF SNTH (GAUZE/BANDAGES/DRESSINGS) ×1
BNDG COHESIVE 1X5 TAN STRL LF (GAUZE/BANDAGES/DRESSINGS) IMPLANT
BNDG COHESIVE 4X5 TAN STRL (GAUZE/BANDAGES/DRESSINGS) ×1 IMPLANT
BNDG COHESIVE 6X5 TAN STRL LF (GAUZE/BANDAGES/DRESSINGS) IMPLANT
BNDG ESMARK 4X9 LF (GAUZE/BANDAGES/DRESSINGS) ×2 IMPLANT
BNDG GAUZE STRTCH 6 (GAUZE/BANDAGES/DRESSINGS) IMPLANT
CLOTH BEACON ORANGE TIMEOUT ST (SAFETY) ×2 IMPLANT
CORDS BIPOLAR (ELECTRODE) ×2 IMPLANT
COVER SURGICAL LIGHT HANDLE (MISCELLANEOUS) ×2 IMPLANT
CUFF TOURNIQUET SINGLE 18IN (TOURNIQUET CUFF) IMPLANT
CUFF TOURNIQUET SINGLE 24IN (TOURNIQUET CUFF) IMPLANT
CUFF TOURNIQUET SINGLE 34IN LL (TOURNIQUET CUFF) IMPLANT
CUFF TOURNIQUET SINGLE 44IN (TOURNIQUET CUFF) IMPLANT
DRAPE U-SHAPE 47X51 STRL (DRAPES) ×2 IMPLANT
DRSG ADAPTIC 3X8 NADH LF (GAUZE/BANDAGES/DRESSINGS) ×1 IMPLANT
DURAPREP 26ML APPLICATOR (WOUND CARE) ×2 IMPLANT
ELECT REM PT RETURN 9FT ADLT (ELECTROSURGICAL) ×2
ELECTRODE REM PT RTRN 9FT ADLT (ELECTROSURGICAL) ×1 IMPLANT
GAUZE SPONGE 2X2 8PLY STRL LF (GAUZE/BANDAGES/DRESSINGS) IMPLANT
GLOVE BIOGEL PI IND STRL 9 (GLOVE) ×1 IMPLANT
GLOVE BIOGEL PI INDICATOR 9 (GLOVE) ×1
GLOVE SURG ORTHO 9.0 STRL STRW (GLOVE) ×2 IMPLANT
GOWN PREVENTION PLUS XLARGE (GOWN DISPOSABLE) ×2 IMPLANT
GOWN SRG XL XLNG 56XLVL 4 (GOWN DISPOSABLE) ×1 IMPLANT
GOWN STRL NON-REIN XL XLG LVL4 (GOWN DISPOSABLE) ×2
KIT BASIN OR (CUSTOM PROCEDURE TRAY) ×2 IMPLANT
KIT ROOM TURNOVER OR (KITS) ×2 IMPLANT
MANIFOLD NEPTUNE II (INSTRUMENTS) ×2 IMPLANT
NDL HYPO 25GX1X1/2 BEV (NEEDLE) IMPLANT
NEEDLE HYPO 25GX1X1/2 BEV (NEEDLE) IMPLANT
NS IRRIG 1000ML POUR BTL (IV SOLUTION) ×2 IMPLANT
PACK ORTHO EXTREMITY (CUSTOM PROCEDURE TRAY) ×2 IMPLANT
PAD ARMBOARD 7.5X6 YLW CONV (MISCELLANEOUS) ×4 IMPLANT
PAD CAST 4YDX4 CTTN HI CHSV (CAST SUPPLIES) IMPLANT
PADDING CAST COTTON 4X4 STRL (CAST SUPPLIES)
SPECIMEN JAR SMALL (MISCELLANEOUS) ×2 IMPLANT
SPONGE GAUZE 2X2 STER 10/PKG (GAUZE/BANDAGES/DRESSINGS)
SPONGE GAUZE 4X4 12PLY (GAUZE/BANDAGES/DRESSINGS) ×1 IMPLANT
SUCTION FRAZIER TIP 10 FR DISP (SUCTIONS) IMPLANT
SUT ETHILON 2 0 PSLX (SUTURE) IMPLANT
SUT VIC AB 2-0 FS1 27 (SUTURE) IMPLANT
SYR CONTROL 10ML LL (SYRINGE) IMPLANT
TOWEL OR 17X24 6PK STRL BLUE (TOWEL DISPOSABLE) ×2 IMPLANT
TOWEL OR 17X26 10 PK STRL BLUE (TOWEL DISPOSABLE) ×2 IMPLANT
TUBE CONNECTING 12X1/4 (SUCTIONS) IMPLANT
WATER STERILE IRR 1000ML POUR (IV SOLUTION) ×2 IMPLANT

## 2012-05-31 NOTE — Progress Notes (Signed)
Inpatient Diabetes Program Recommendations  AACE/ADA: New Consensus Statement on Inpatient Glycemic Control (2009)  Target Ranges:  Prepandial:   less than 140 mg/dL      Peak postprandial:   less than 180 mg/dL (1-2 hours)      Critically ill patients:  140 - 180 mg/dL   Reason for Visit: R6E=4.5% and patient admitted with right foot cellulitis.  Patient to have surgery today.  Spoke to patient regarding current A1C and lifestyle.  He states that he was on insulin in the past, but had to stop due to cost when he lost his job in January 2012.  He had a temporary job in November/December that required him to be on his feet.  He developed a right foot ulcer.  He has filed for disability several times but was denied.  He thinks that he is going to be able to get Medicaid.  He states that he takes his oral medications sporadically and hopes that he can continue to control CBG's with oral medications.  Explained that his CBGs have not been controlled and that in order to help with healing, he needs good glycemic control.  He states that he does not eat vegetables, beans or many fruits.  He only eats 2 times a day.  Asked patient what motivates him to take care of himself and his diabetes.  He began to tear up and states that he is motivated to be here for his children.  Needs close follow-up.  Will order case management and dietician consult.  Also gave patient information regarding the generic meter from [employer].  His daughter was present and seemed that she could be supportive to her Dad.  Based on A1C it appears that patient will need insulin to achieve glycemic control.

## 2012-05-31 NOTE — Anesthesia Preprocedure Evaluation (Addendum)
Anesthesia Evaluation  Patient identified by MRN, date of birth, ID band Patient awake    Reviewed: Allergy & Precautions, H&P , NPO status , Patient's Chart, lab work & pertinent test results  History of Anesthesia Complications Negative for: history of anesthetic complications  Airway Mallampati: I TM Distance: >3 FB Neck ROM: Full    Dental  (+) Poor Dentition   Pulmonary COPDCurrent Smoker,  + rhonchi         Cardiovascular negative cardio ROS      Neuro/Psych negative neurological ROS  negative psych ROS   GI/Hepatic negative GI ROS, Neg liver ROS,   Endo/Other  Diabetes mellitus-, Type 2, Oral Hypoglycemic Agents  Renal/GU negative Renal ROS     Musculoskeletal negative musculoskeletal ROS (+)   Abdominal   Peds  Hematology negative hematology ROS (+)   Anesthesia Other Findings   Reproductive/Obstetrics negative OB ROS                         Anesthesia Physical Anesthesia Plan  ASA: III  Anesthesia Plan: General   Post-op Pain Management:    Induction: Intravenous  Airway Management Planned: LMA  Additional Equipment:   Intra-op Plan:   Post-operative Plan: Extubation in OR  Informed Consent: I have reviewed the patients History and Physical, chart, labs and discussed the procedure including the risks, benefits and alternatives for the proposed anesthesia with the patient or authorized representative who has indicated his/her understanding and acceptance.     Plan Discussed with: CRNA and Surgeon  Anesthesia Plan Comments:         Anesthesia Quick Evaluation

## 2012-05-31 NOTE — Op Note (Signed)
OPERATIVE REPORT  DATE OF SURGERY: 05/31/2012  PATIENT:  Derrick [name],  41 y.o. 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 Local tissue rearrangement for flap coverage for a wound 6 cm in diameter.  SURGEON:  Surgeon(s): Nadara Mustard, MD  ANESTHESIA:   general  EBL:  Minimal ML  SPECIMEN:  No Specimen  TOURNIQUET:  * No tourniquets in log *  PROCEDURE DETAILS: Patient is a 41 year old gentleman diabetic insensate neuropathy uncontrolled who presents with abscess draining purulence with ice myelitis of the fifth metatarsal since for evaluation and treatment. Patient initially started on IV antibiotics and presents at this time for surgical intervention. Risks and benefits were discussed including persistent infection neurovascular injury nonhealing of the wound potential for higher level amputation. Patient states he understands was pursued this time. Description of procedure patient brought to or room 2 and underwent a general anesthetic. After adequate levels of anesthesia were obtained patient's right lower extremity was prepped using DuraPrep and draped into a sterile field an incision was made over the purulent necrotic abscess and there was completely nonviable purulent tissue. The wound edges were debrided back to healthy viable tissue and this necessitated the amputation of the fifth ray. The wounds were irrigated with normal saline there was healthy viable tissue the wound edges were locally transferred reapproximated and hammer sterile was approximately a half a centimeter of gap along the wound edges. There was no tension the skin of the cellulitis had improved after decompression of the abscess. The wound is irrigated with normal saline the wound is covered with Adaptic orthopedic sponges ABDs  Kerlix and Coban. Patient was extubated taken the PACU in stable condition.  PLAN OF CARE: Admit to inpatient   PATIENT DISPOSITION:  PACU - hemodynamically stable.   Nadara Mustard, MD 05/31/2012 5:07 PM

## 2012-05-31 NOTE — Transfer of Care (Signed)
Immediate Anesthesia Transfer of Care Note  Patient: Derrick [name]  Procedure(s) Performed: Procedure(s) (LRB): AMPUTATION DIGIT (Right)  Patient Location: PACU  Anesthesia Type: General  Level of Consciousness: awake, alert , oriented and patient cooperative  Airway & Oxygen Therapy: Patient Spontanous Breathing and Patient connected to nasal cannula oxygen  Post-op Assessment: Report given to PACU RN, Post -op Vital signs reviewed and stable and Patient moving all extremities  Post vital signs: Reviewed and stable  Complications: No apparent anesthesia complications

## 2012-05-31 NOTE — OR Nursing (Signed)
No specimen to be sent per MD request.

## 2012-05-31 NOTE — Anesthesia Postprocedure Evaluation (Signed)
  Anesthesia Post-op Note  Patient: Derrick [name]  Procedure(s) Performed: Procedure(s) (LRB): AMPUTATION DIGIT (Right)  Patient Location: PACU  Anesthesia Type: General  Level of Consciousness: awake  Airway and Oxygen Therapy: Patient Spontanous Breathing  Post-op Pain: mild  Post-op Assessment: Post-op Vital signs reviewed, Patient's Cardiovascular Status Stable, Respiratory Function Stable, Patent Airway, No signs of Nausea or vomiting and Pain level controlled  Post-op Vital Signs: stable  Complications: No apparent anesthesia complications

## 2012-05-31 NOTE — Preoperative (Signed)
Beta Blockers   Reason not to administer Beta Blockers:Not Applicable 

## 2012-05-31 NOTE — Progress Notes (Signed)
Orthopedic Tech Progress Note Patient Details:  Derrick [name] 03-20-1971 [id number]  Ortho Devices Type of Ortho Device: Postop boot Ortho Device/Splint Location: right foot Ortho Device/Splint Interventions: Application   Nikki Dom 05/31/2012, 6:29 PM

## 2012-05-31 NOTE — Progress Notes (Signed)
ANTIBIOTIC CONSULT NOTE - FOLLOW UP  Pharmacy Consult for Vancomycin Indication: R foot cellulitis / diabetic foot infection  No Known Allergies  Patient Measurements:   Wt: 96.9 kg  Vital Signs: Temp: 98.4 F (36.9 C) (06/14 0600) Temp src: Oral (06/14 0600) BP: 115/71 mmHg (06/14 0600) Pulse Rate: 95  (06/14 0600) Intake/Output from previous day:   Intake/Output from this shift:    Labs:  Basename 05/31/12 0600 05/30/12 0653 05/29/12 2247 05/29/12 2228  WBC 22.4* 24.3* -- 30.4*  HGB 13.1 13.1 16.0 --  PLT 233 222 -- 225  LABCREA -- -- -- --  CREATININE 0.69 0.71 0.90 --   The CrCl is unknown because both a height and weight (above a minimum accepted value) are required for this calculation. No results found for this basename: VANCOTROUGH:2,VANCOPEAK:2,VANCORANDOM:2,GENTTROUGH:2,GENTPEAK:2,GENTRANDOM:2,TOBRATROUGH:2,TOBRAPEAK:2,TOBRARND:2,AMIKACINPEAK:2,AMIKACINTROU:2,AMIKACIN:2, in the last 72 hours   Microbiology: Recent Results (from the past 720 hour(s))  CULTURE, BLOOD (ROUTINE X 2)     Status: Normal (Preliminary result)   Collection Time   05/29/12 11:20 PM      Component Value Range Status Comment   Specimen Description BLOOD RIGHT ARM   Final    Special Requests BOTTLES DRAWN AEROBIC AND ANAEROBIC 10CC   Final    Culture  Setup Time [id number]   Final    Culture     Final    Value:        BLOOD CULTURE RECEIVED NO GROWTH TO DATE CULTURE WILL BE HELD FOR 5 DAYS BEFORE ISSUING A FINAL NEGATIVE REPORT   Report Status PENDING   Incomplete   CULTURE, BLOOD (ROUTINE X 2)     Status: Normal (Preliminary result)   Collection Time   05/29/12 11:40 PM      Component Value Range Status Comment   Specimen Description BLOOD LEFT ARM   Final    Special Requests BOTTLES DRAWN AEROBIC AND ANAEROBIC 10CC   Final    Culture  Setup Time [id number]   Final    Culture     Final    Value:        BLOOD CULTURE RECEIVED NO GROWTH TO DATE CULTURE WILL BE HELD FOR 5 DAYS  BEFORE ISSUING A FINAL NEGATIVE REPORT   Report Status PENDING   Incomplete   SURGICAL PCR SCREEN     Status: Normal   Collection Time   05/30/12  5:21 PM      Component Value Range Status Comment   MRSA, PCR NEGATIVE  NEGATIVE Final    Staphylococcus aureus NEGATIVE  NEGATIVE Final     Anti-infectives     Start     Dose/Rate Route Frequency Ordered Stop   05/30/12 0800   vancomycin (VANCOCIN) IVPB 1000 mg/200 mL premix        1,000 mg 200 mL/hr over 60 Minutes Intravenous Every 8 hours 05/30/12 0143     05/30/12 0600   piperacillin-tazobactam (ZOSYN) IVPB 3.375 g  Status:  Discontinued        3.375 g 100 mL/hr over 30 Minutes Intravenous 3 times per day 05/30/12 0133 05/30/12 0145   05/30/12 0600  piperacillin-tazobactam (ZOSYN) IVPB 3.375 g       3.375 g 12.5 mL/hr over 240 Minutes Intravenous Every 8 hours 05/30/12 0145     05/29/12 2315   vancomycin (VANCOCIN) IVPB 1000 mg/200 mL premix        1,000 mg 200 mL/hr over 60 Minutes Intravenous  Once 05/29/12 2315 05/30/12 0155   05/29/12 2315  piperacillin-tazobactam (  ZOSYN) IVPB 3.375 g       3.375 g 100 mL/hr over 30 Minutes Intravenous  Once 05/29/12 2315 05/30/12 0238          Assessment: 41 y.o. M on Vancomycin for R foot cellulitis / diabetic foot infection. Ortho has been consulted and the patient is going for surgical debridement today. Tmax/24h: 101.6, WBC 22.4 << 24.3. Blood cultures show NGTD. Renal function remains stable -- SCr 0.69, CrCl~100 ml/min. Dose remains appropriate at this time.  Goal of Therapy:  Vancomycin trough level 10-15 mcg/ml  Plan:  1. Continue Vancomycin 1g IV every 8 hours 2. Will continue to follow renal function, culture results, LOT, and antibiotic de-escalation plans   Georgina Pillion, PharmD, BCPS Clinical Pharmacist Pager: [phone] 05/31/2012 11:14 AM

## 2012-05-31 NOTE — Anesthesia Procedure Notes (Signed)
Procedure Name: LMA Insertion Date/Time: 05/31/2012 4:37 PM Performed by: Jerilee Hoh Pre-anesthesia Checklist: Patient identified, Emergency Drugs available, Suction available and Patient being monitored Patient Re-evaluated:Patient Re-evaluated prior to inductionOxygen Delivery Method: Circle system utilized Preoxygenation: Pre-oxygenation with 100% oxygen Intubation Type: IV induction LMA: LMA inserted LMA Size: 4.0 Tube type: Oral Number of attempts: 1 Placement Confirmation: positive ETCO2 and breath sounds checked- equal and bilateral Tube secured with: Tape Dental Injury: Teeth and Oropharynx as per pre-operative assessment

## 2012-05-31 NOTE — Progress Notes (Signed)
ANTICOAGULATION CONSULT NOTE - Initial Consult  Pharmacy Consult for Warfarin  Indication: DVT Prevention   No Known Allergies  Patient Measurements: Weight - 96.9 kg Height - 185 cm ( 72 in )  Vital Signs: Temp: 98 F (36.7 C) (06/14 1816) BP: 119/80 mmHg (06/14 1816) Pulse Rate: 95  (06/14 1816)  Labs:  Basename 05/31/12 0600 05/30/12 0653 05/29/12 2247 05/29/12 2228  HGB 13.1 13.1 -- --  HCT 38.1* 38.7* 47.0 --  PLT 233 222 -- 225  CREATININE 0.69 0.71 0.90 --    Medical History: Past Medical History  Diagnosis Date  . Diabetes mellitus   . Prostatitis    Medications:  Prescriptions prior to admission  Medication Sig Dispense Refill  . ciprofloxacin (CIPRO) 500 MG tablet Take 500 mg by mouth 2 (two) times daily. Start date 04/12/12; duration unknown      . gabapentin (NEURONTIN) 300 MG capsule Take 1 capsule (300 mg total) by mouth 2 (two) times daily. For neuropathy  60 capsule  3  . glyBURIDE (DIABETA) 5 MG tablet Take 1 tablet (5 mg total) by mouth 2 (two) times daily with a meal.  60 tablet  0  . HYDROcodone-acetaminophen (VICODIN) 5-500 MG per tablet Take 1 tablet by mouth every 6 (six) hours as needed. For pain      . ibuprofen (ADVIL,MOTRIN) 200 MG tablet Take 400 mg by mouth every 6 (six) hours as needed. For pain      . metFORMIN (GLUCOPHAGE) 500 MG tablet Take 1 tablet (500 mg total) by mouth 2 (two) times daily with a meal.  60 tablet  1  . promethazine (PHENERGAN) 12.5 MG tablet Take 1 tablet (12.5 mg total) by mouth every 6 (six) hours as needed for nausea.  30 tablet  0    Assessment: 41yo male with chronic osteomyelitis and toe amputations.  He is now s/p amputation of digit on the right foot.  He will be started on anticoagulation due to reduced mobility and increased risk of thrombotic event.  His last LFT's in April were normal and he has no history of liver dysfunction.  His baseline CBC and platelets are WNL and there is no noted bleeding  history.  Goal of Therapy:  INR 2-3 Monitor platelets by anticoagulation protocol: Yes   Plan:   Warfarin 10mg  x 1 tonight  PT/INR daily  Warfarin teaching book and video for patient review   Nadara Mustard, PharmD., MS Clinical Pharmacist Pager:  [phone]  Thank you for allowing pharmacy to be part of this patients care team. 05/31/2012,7:16 PM

## 2012-05-31 NOTE — Progress Notes (Signed)
DAILY PROGRESS NOTE                              [facility]AL [facility]  SUBJECTIVE: No complaints.  OBJECTIVE: BP 115/71  Pulse 95  Temp 98.4 F (36.9 C) (Oral)  Resp 18  SpO2 93% No intake or output data in the 24 hours ending 05/31/12 1035                    Weight change:  Physical Exam: General: Alert and awake oriented x3 not in any acute distress. HEENT: anicteric sclera, pupils equal reactive to light and accommodation CVS: S1-S2 heard, no murmur rubs or gallops Chest: clear to auscultation bilaterally, no wheezing rales or rhonchi Abdomen:  normal bowel sounds, soft, nontender, nondistended, no organomegaly Neuro: Cranial nerves II-XII intact, no focal neurological deficits Extremities: no cyanosis, the dark area on the left side of the foot worsening.   Lab Results:  Basename 05/31/12 0600 05/30/12 0653  NA 134* 136  K 3.4* 3.0*  CL 95* 99  CO2 28 27  GLUCOSE 176* 141*  BUN 13 10  CREATININE 0.69 0.71  CALCIUM 9.3 9.0  MG -- --  PHOS -- --   No results found for this basename: AST:2,ALT:2,ALKPHOS:2,BILITOT:2,PROT:2,ALBUMIN:2 in the last 72 hours No results found for this basename: LIPASE:2,AMYLASE:2 in the last 72 hours  Basename 05/31/12 0600 05/30/12 0653 05/29/12 2228  WBC 22.4* 24.3* --  NEUTROABS -- -- 24.3*  HGB 13.1 13.1 --  HCT 38.1* 38.7* --  MCV 83.7 84.7 --  PLT 233 222 --   No results found for this basename: CKTOTAL:3,CKMB:3,CKMBINDEX:3,TROPONINI:3 in the last 72 hours No components found with this basename: POCBNP:3 No results found for this basename: DDIMER:2 in the last 72 hours No results found for this basename: HGBA1C:2 in the last 72 hours No results found for this basename: CHOL:2,HDL:2,LDLCALC:2,TRIG:2,CHOLHDL:2,LDLDIRECT:2 in the last 72 hours No results found for this basename: TSH,T4TOTAL,FREET3,T3FREE,THYROIDAB in the last 72 hours No results found for this basename:  VITAMINB12:2,FOLATE:2,FERRITIN:2,TIBC:2,IRON:2,RETICCTPCT:2 in the last 72 hours  Micro Results: Recent Results (from the past 240 hour(s))  CULTURE, BLOOD (ROUTINE X 2)     Status: Normal (Preliminary result)   Collection Time   05/29/12 11:20 PM      Component Value Range Status Comment   Specimen Description BLOOD RIGHT ARM   Final    Special Requests BOTTLES DRAWN AEROBIC AND ANAEROBIC 10CC   Final    Culture  Setup Time [id number]   Final    Culture     Final    Value:        BLOOD CULTURE RECEIVED NO GROWTH TO DATE CULTURE WILL BE HELD FOR 5 DAYS BEFORE ISSUING A FINAL NEGATIVE REPORT   Report Status PENDING   Incomplete   CULTURE, BLOOD (ROUTINE X 2)     Status: Normal (Preliminary result)   Collection Time   05/29/12 11:40 PM      Component Value Range Status Comment   Specimen Description BLOOD LEFT ARM   Final    Special Requests BOTTLES DRAWN AEROBIC AND ANAEROBIC 10CC   Final    Culture  Setup Time [id number]   Final    Culture     Final    Value:        BLOOD CULTURE RECEIVED NO GROWTH TO DATE CULTURE WILL BE HELD FOR 5 DAYS BEFORE ISSUING A FINAL NEGATIVE REPORT  Report Status PENDING   Incomplete   SURGICAL PCR SCREEN     Status: Normal   Collection Time   05/30/12  5:21 PM      Component Value Range Status Comment   MRSA, PCR NEGATIVE  NEGATIVE Final    Staphylococcus aureus NEGATIVE  NEGATIVE Final     Studies/Results: Dg Foot Complete Right  05/30/2012  *RADIOLOGY REPORT*  Clinical Data: Foot pain  RIGHT FOOT COMPLETE - 3+ VIEW  Comparison: 04/12/2012  Findings: Amputation of the fourth digit at the metatarsophalangeal joint.  No acute fracture or dislocation identified.  Midfoot DJD. No radiopaque foreign body.  There is soft tissue swelling along the dorsum and lateral aspect of the foot, with a few locules of gas laterally.  Posterior and plantar calcaneal enthesopathic changes.  IMPRESSION: Soft tissue swelling along the dorsum and lateral aspect of the foot.   There are a few locules of gas.  Correlate clinically for ulcer/cellulitis.  No underlying acute osseous finding.  If clinical concern for osteomyelitis persists, MRI follow-up recommended.  Original Report Authenticated By: Waneta Martins, M.D.   Medications: Scheduled Meds:   . aspirin EC  81 mg Oral Daily  . chlorhexidine  60 mL Topical Once  . docusate sodium  100 mg Oral BID  . gabapentin  300 mg Oral BID  . glyBURIDE  5 mg Oral BID WC  . insulin aspart  0-15 Units Subcutaneous Q4H  . piperacillin-tazobactam (ZOSYN)  IV  3.375 g Intravenous Q8H  . potassium chloride  40 mEq Oral Q6H  . vancomycin  1,000 mg Intravenous Q8H  . DISCONTD: enoxaparin  40 mg Subcutaneous Q24H   Continuous Infusions:   . dextrose 5 % and 0.9% NaCl 100 mL/hr at 05/30/12 0700   PRN Meds:.HYDROcodone-acetaminophen, HYDROmorphone (DILAUDID) injection, ondansetron (ZOFRAN) IV, ondansetron  ASSESSMENT & PLAN: Active Problems:  Cellulitis  Diabetic toe ulcer  Diabetes mellitus  Leukocytosis  Tobacco abuse   Right lower extremity cellulitis/diabetic foot ulcer -History of previous amputation related to cellulitis/chronic ulcers from diabetes. -Dr. Lajoyce Corners from orthopedics consulted, patient is for debridement today. -Patient is on vancomycin and Zosyn.  Diabetes mellitus type 2 -Hemoglobin A1c was checked on 04/12/2012 was 9.3 correlate with average blood glucose of 220. -His home medication glyburide continued, insulin sliding scale started.  -A lot of counseling about diabetic diet was done and patient refused to comply with diabetic diet. -He wanted to eat regular food, he said he will just order from outside "I'm a junk food junky" if he is not getting what he wants.  Hypokalemia -Replete with oral supplements.  Leukocytosis -This is likely secondary to the diabetic foot ulcer.   LOS: 2 days   Dhana Totton A 05/31/2012, 10:35 AM

## 2012-05-31 NOTE — H&P (Signed)
  Patient presents after failure of conservative treatment with chronic ulceration abscess osteomyelitis fifth metatarsal right foot. Do to failure of conservative treatment patient presents at this time for fifth ray amputation. Alternatives to surgery were discussed patient states he understands was to proceed at this time with surgery.

## 2012-05-31 NOTE — Progress Notes (Signed)
Nutrition brief note:  RD went to pt room for Dm education per consult. Pt was not in room, family in room stated pt had been taken for surgery. RD left "Plate Method" hand out. RD will return tomorrow to complete education with pt.   Derrick [name]

## 2012-06-01 DIAGNOSIS — E1165 Type 2 diabetes mellitus with hyperglycemia: Secondary | ICD-10-CM

## 2012-06-01 DIAGNOSIS — Z9119 Patient's noncompliance with other medical treatment and regimen: Secondary | ICD-10-CM

## 2012-06-01 DIAGNOSIS — L02419 Cutaneous abscess of limb, unspecified: Secondary | ICD-10-CM

## 2012-06-01 DIAGNOSIS — L03119 Cellulitis of unspecified part of limb: Secondary | ICD-10-CM

## 2012-06-01 LAB — HEMOGLOBIN A1C: Mean Plasma Glucose: 206 mg/dL — ABNORMAL HIGH (ref <117)

## 2012-06-01 LAB — GLUCOSE, CAPILLARY
Glucose-Capillary: 161 mg/dL — ABNORMAL HIGH (ref 70–99)
Glucose-Capillary: 264 mg/dL — ABNORMAL HIGH (ref 70–99)

## 2012-06-01 LAB — PROTIME-INR
INR: 1.04 (ref 0.00–1.49)
Prothrombin Time: 13.8 seconds (ref 11.6–15.2)

## 2012-06-01 MED ORDER — ZOLPIDEM TARTRATE 5 MG PO TABS
5.0000 mg | ORAL_TABLET | Freq: Once | ORAL | Status: AC
  Administered 2012-06-01: 5 mg via ORAL
  Filled 2012-06-01: qty 1

## 2012-06-01 MED ORDER — WARFARIN SODIUM 10 MG PO TABS
10.0000 mg | ORAL_TABLET | Freq: Once | ORAL | Status: AC
  Administered 2012-06-01: 10 mg via ORAL
  Filled 2012-06-01: qty 1

## 2012-06-01 NOTE — Progress Notes (Signed)
DAILY PROGRESS NOTE                              [facility]AL [facility]  SUBJECTIVE: Complaining about he still getting the diabetic diet and he wants it regular food, he does not want the phlebotomist to wake him up early in the morning and transfuse the blood collection today. Patient has regular (non-diet) Mountain Dew soda bottle at bedside  OBJECTIVE: BP 122/80  Pulse 93  Temp 97.8 F (36.6 C) (Oral)  Resp 18  SpO2 97%  Intake/Output Summary (Last 24 hours) at 06/01/12 1056 Last data filed at 06/01/12 0000  Gross per 24 hour  Intake 1119.5 ml  Output     50 ml  Net 1069.5 ml                      Weight change:  Physical Exam: General: Alert and awake oriented x3 not in any acute distress. HEENT: anicteric sclera, pupils equal reactive to light and accommodation CVS: S1-S2 heard, no murmur rubs or gallops Chest: clear to auscultation bilaterally, no wheezing rales or rhonchi Abdomen:  normal bowel sounds, soft, nontender, nondistended, no organomegaly Neuro: Cranial nerves II-XII intact, no focal neurological deficits Extremities: no cyanosis, right foot bandages   Lab Results:  Basename 05/31/12 0600 05/30/12 0653  NA 134* 136  K 3.4* 3.0*  CL 95* 99  CO2 28 27  GLUCOSE 176* 141*  BUN 13 10  CREATININE 0.69 0.71  CALCIUM 9.3 9.0  MG -- --  PHOS -- --   No results found for this basename: AST:2,ALT:2,ALKPHOS:2,BILITOT:2,PROT:2,ALBUMIN:2 in the last 72 hours No results found for this basename: LIPASE:2,AMYLASE:2 in the last 72 hours  Basename 05/31/12 0600 05/30/12 0653 05/29/12 2228  WBC 22.4* 24.3* --  NEUTROABS -- -- 24.3*  HGB 13.1 13.1 --  HCT 38.1* 38.7* --  MCV 83.7 84.7 --  PLT 233 222 --   No results found for this basename: CKTOTAL:3,CKMB:3,CKMBINDEX:3,TROPONINI:3 in the last 72 hours No components found with this basename: POCBNP:3 No results found for this basename: DDIMER:2 in the last 72 hours  Basename 05/31/12 1428    HGBA1C 8.8*   No results found for this basename: CHOL:2,HDL:2,LDLCALC:2,TRIG:2,CHOLHDL:2,LDLDIRECT:2 in the last 72 hours No results found for this basename: TSH,T4TOTAL,FREET3,T3FREE,THYROIDAB in the last 72 hours No results found for this basename: VITAMINB12:2,FOLATE:2,FERRITIN:2,TIBC:2,IRON:2,RETICCTPCT:2 in the last 72 hours  Micro Results: Recent Results (from the past 240 hour(s))  CULTURE, BLOOD (ROUTINE X 2)     Status: Normal (Preliminary result)   Collection Time   05/29/12 11:20 PM      Component Value Range Status Comment   Specimen Description BLOOD RIGHT ARM   Final    Special Requests BOTTLES DRAWN AEROBIC AND ANAEROBIC 10CC   Final    Culture  Setup Time [id number]   Final    Culture     Final    Value:        BLOOD CULTURE RECEIVED NO GROWTH TO DATE CULTURE WILL BE HELD FOR 5 DAYS BEFORE ISSUING A FINAL NEGATIVE REPORT   Report Status PENDING   Incomplete   CULTURE, BLOOD (ROUTINE X 2)     Status: Normal (Preliminary result)   Collection Time   05/29/12 11:40 PM      Component Value Range Status Comment   Specimen Description BLOOD LEFT ARM   Final    Special Requests BOTTLES DRAWN  AEROBIC AND ANAEROBIC 10CC   Final    Culture  Setup Time [id number]   Final    Culture     Final    Value:        BLOOD CULTURE RECEIVED NO GROWTH TO DATE CULTURE WILL BE HELD FOR 5 DAYS BEFORE ISSUING A FINAL NEGATIVE REPORT   Report Status PENDING   Incomplete   SURGICAL PCR SCREEN     Status: Normal   Collection Time   05/30/12  5:21 PM      Component Value Range Status Comment   MRSA, PCR NEGATIVE  NEGATIVE Final    Staphylococcus aureus NEGATIVE  NEGATIVE Final     Studies/Results: Dg Foot Complete Right  05/30/2012  *RADIOLOGY REPORT*  Clinical Data: Foot pain  RIGHT FOOT COMPLETE - 3+ VIEW  Comparison: 04/12/2012  Findings: Amputation of the fourth digit at the metatarsophalangeal joint.  No acute fracture or dislocation identified.  Midfoot DJD. No radiopaque foreign  body.  There is soft tissue swelling along the dorsum and lateral aspect of the foot, with a few locules of gas laterally.  Posterior and plantar calcaneal enthesopathic changes.  IMPRESSION: Soft tissue swelling along the dorsum and lateral aspect of the foot.  There are a few locules of gas.  Correlate clinically for ulcer/cellulitis.  No underlying acute osseous finding.  If clinical concern for osteomyelitis persists, MRI follow-up recommended.  Original Report Authenticated By: Waneta Martins, M.D.   Medications: Scheduled Meds:    . aspirin EC  81 mg Oral Daily  . coumadin book   Does not apply Once  . docusate sodium  100 mg Oral BID  . gabapentin  300 mg Oral BID  . glyBURIDE  5 mg Oral BID WC  . insulin aspart  0-15 Units Subcutaneous Q4H  . piperacillin-tazobactam (ZOSYN)  IV  3.375 g Intravenous Q8H  . vancomycin  1,000 mg Intravenous Q8H  . warfarin  10 mg Oral Once  . warfarin   Does not apply Once  . Warfarin - Pharmacist Dosing Inpatient   Does not apply q1800  . zolpidem  5 mg Oral Once  . DISCONTD: chlorhexidine  60 mL Topical Once   Continuous Infusions:    . sodium chloride 10 mL/hr at 05/31/12 1957  . dextrose 5 % and 0.9% NaCl 100 mL/hr at 05/30/12 0700   PRN Meds:.HYDROmorphone (DILAUDID) injection, metoCLOPramide (REGLAN) injection, metoCLOPramide, ondansetron (ZOFRAN) IV, ondansetron, oxyCODONE-acetaminophen, DISCONTD: 0.9 % irrigation (POUR BTL), DISCONTD: fentaNYL, DISCONTD: HYDROcodone-acetaminophen, DISCONTD:  HYDROmorphone (DILAUDID) injection, DISCONTD:  HYDROmorphone (DILAUDID) injection, DISCONTD: LORazepam, DISCONTD: midazolam, DISCONTD: ondansetron (ZOFRAN) IV, DISCONTD: ondansetron  ASSESSMENT & PLAN: Active Problems:  Cellulitis  Diabetic toe ulcer  Diabetes mellitus  Leukocytosis  Tobacco abuse   Right lower extremity cellulitis/diabetic foot ulcer -History of previous amputation related to cellulitis/chronic ulcers from  diabetes. -Patient is on vancomycin and Zosyn. Continue current antibiotics awaiting for deep tissue cultures. -Status post right fifth ray amputation and debridement.  Diabetes mellitus type 2 -Hemoglobin A1c is 8.8 correlate with average blood glucose of 206. -His home medication glyburide continued, insulin sliding scale started.  -A lot of counseling about diabetic diet was done and patient refused to comply with diabetic diet. -He wanted to eat regular food, he said he will just order from outside "I'm a junk food junky" if he is not getting what he wants.  Hypokalemia -Replete with oral supplements.  Leukocytosis -This is likely secondary to the diabetic foot ulcer.  LOS: 3 days   Magdelene Ruark A 06/01/2012, 10:56 AM

## 2012-06-01 NOTE — Plan of Care (Signed)
Problem: Not Ready for Diet/Lifestyle Change (NB-1.3) Goal: Nutrition education Formal process to instruct or train a patient/client in a skill or to impart knowledge to help patients/clients voluntarily manage or modify food choices and eating behavior to maintain or improve health.  Outcome: Completed/Met Date Met:  06/01/12 RD consulted for diet education. This RD has previously spoken with the patient about his diet. Pt still refuses to give up his daily sodas but has cut back on them, noted [facility] on bedside table at time of RD visit. Pt states he drinks this one soda throughout the day. Pt reports he does not like fruit or vegetables, mostly eats things like wings and burgers. RD provided pt with the "Plate Method" hand out to help pt reduce his carbohydrate intake and increase protein and other food groups. RD went over types of foods that contain carbohydrates. RD helped pt identify easy meals and snack ideas that fit into the plate method. Pt is willing to make some changes, but not many. RD expects poor compliance. RD tried to explain the importance of reducing blood sugars to help reduce the risk of further amputations, but pt stated this visit was not his fault. RD answered all questions. Pt may benefit from out patient nutrition and diabetes management referral, if he will attend. Chart reviewed, no additional nutrition interventions at this time. Please re-consult if needed.   Clarene Duke MARIE

## 2012-06-01 NOTE — Progress Notes (Signed)
Subjective: 1 Day Post-Op Procedure(s) (LRB): AMPUTATION DIGIT (Right) awake alert oriented x4. Desires to stay until the right foot is completely healed. Informed that this may take as long as 2-4 weeks so that it is not reasonable to think that keep him until the foot is completely healed. Likely he'll be her this week and may be ready for discharge Monday or Tuesday. We will identify bacteria involved and sensitivities to antibiotics. Indicates his sensation is normal in both feet. Concerned that the infection may not controlled and he has been hospitalized several times the same right foot condition. Explained to the patient that would not discharge before we are sure that he is stable. Patient reports pain as 3 on 0-10 scale.    Objective: Vital signs in last 24 hours: Temp:  [97.8 F (36.6 C)-98.2 F (36.8 C)] 97.8 F (36.6 C) (06/15 0615) Pulse Rate:  [89-100] 93  (06/15 0615) Resp:  [9-20] 18  (06/15 0615) BP: (114-130)/(68-85) 122/80 mmHg (06/15 0615) SpO2:  [87 %-100 %] 97 % (06/15 0615)  Intake/Output from previous day: 06/14 0701 - 06/15 0700 In: 1119.5 [I.V.:869.5; IV Piggyback:250] Out: 50 [Blood:50] Intake/Output this shift:     Basename 05/31/12 0600 05/30/12 0653 05/29/12 2247 05/29/12 2228  HGB 13.1 13.1 16.0 15.2    Basename 05/31/12 0600 05/30/12 0653  WBC 22.4* 24.3*  RBC 4.55 4.57  HCT 38.1* 38.7*  PLT 233 222    Basename 05/31/12 0600 05/30/12 0653  NA 134* 136  K 3.4* 3.0*  CL 95* 99  CO2 28 27  BUN 13 10  CREATININE 0.69 0.71  GLUCOSE 176* 141*  CALCIUM 9.3 9.0   No results found for this basename: LABPT:2,INR:2 in the last 72 hours  Intact pulses distally Incision: scant drainage  Assessment/Plan: 1 Day Post-Op Procedure(s) (LRB): AMPUTATION DIGIT (Right)  Advance diet Up with therapy D/C IV fluids Continue ABX therapy due to Post-op infection Dr. Lajoyce Corners will return on Monday and assess wound will reinforce prior to  that.  Kristyn Obyrne E 06/01/2012, 8:57 AM

## 2012-06-01 NOTE — Progress Notes (Signed)
ANTICOAGULATION CONSULT NOTE - Follow Up Consult  Pharmacy Consult for Coumadin Indication: VTE prophylaxis  No Known Allergies  Patient Measurements:     Vital Signs: Temp: 98.2 F (36.8 C) (06/15 1539) Temp src: Oral (06/15 1539) BP: 121/73 mmHg (06/15 1539) Pulse Rate: 91  (06/15 1539)  Labs:  Basename 06/01/12 1320 05/31/12 0600 05/30/12 0653 05/29/12 2247 05/29/12 2228  HGB -- 13.1 13.1 -- --  HCT -- 38.1* 38.7* 47.0 --  PLT -- 233 222 -- 225  APTT -- -- -- -- --  LABPROT 13.8 -- -- -- --  INR 1.04 -- -- -- --  HEPARINUNFRC -- -- -- -- --  CREATININE -- 0.69 0.71 0.90 --  CKTOTAL -- -- -- -- --  CKMB -- -- -- -- --  TROPONINI -- -- -- -- --    The CrCl is unknown because both a height and weight (above a minimum accepted value) are required for this calculation.     Assessment: 41yo male with chronic osteomyelitis and toe amputations. He is now s/p amputation of digit on the right foot. He will be started on anticoagulation due to reduced mobility and increased risk of thrombotic event. His last LFT's in April were normal and he has no history of liver dysfunction. His baseline CBC and platelets are WNL and there is no noted bleeding history.   Today -1.04 Goal of Therapy:  INR 2-3 Monitor platelets by anticoagulation protocol: Yes   Plan:  Warfarin 10 mg at 1800 Pt/inr daily   Lucille Passy 06/01/2012,3:54 PM

## 2012-06-01 NOTE — Evaluation (Signed)
Physical Therapy Evaluation and Discharge  Patient Details Name: Derrick [name] MRN: [id number] DOB: [birth year] Today's Date: 06/01/2012 Time: 1110-1130 PT Time Calculation (min): 20 min  PT Assessment / Plan / Recommendation Clinical Impression  Pt is an impulsive 41y/o male s/p fifth ray amputation of R foot.  Pt instruced in NWB status on R foot and Use of post op shoe.  Unlikely pt will follow NWB precautions as he repeatedly stated he did not need crutches or a walker. Pt appear put off win given instruction.  No further acute PT needs. Suggesting RW for safety     PT Assessment  Patent does not need any further PT services    Follow Up Recommendations  No PT follow up    Barriers to Discharge        lEquipment Recommendations  Rolling walker with 5" wheels    Recommendations for Other Services     Frequency      Precautions / Restrictions Precautions Precautions: None Required Braces or Orthoses: Other Brace/Splint Other Brace/Splint: Post op shoe on Rt foot  Restrictions Weight Bearing Restrictions: Yes RLE Weight Bearing: Non weight bearing   Pertinent Vitals/Pain Pt c/o 6-8/10 pain in Rt foot. RN notified.      Mobility  Bed Mobility Bed Mobility: Supine to Sit;Sit to Supine;Sitting - Scoot to [place] of Bed;Scooting to HOB Supine to Sit: 7: Independent;HOB flat Sitting - Scoot to Edge of Bed: 7: Independent Sit to Supine: 7: Independent;HOB flat Scooting to HOB: 7: Independent Transfers Transfers: Sit to Stand;Stand to [employer] Transfers Sit to Stand: From bed;From chair/3-in-1;5: Supervision Stand to Sit: 5: Supervision;To bed;To chair/3-in-1 Stand Pivot Transfers: 5: Supervision Details for Transfer Assistance: Instructed pt in proper technique with NWB on R LE.  Pt able to return demonstrate.  Repeated cues to "take your time" for safety. Ambulation/Gait Ambulation/Gait Assistance: 5: Supervision Ambulation Distance (Feet): 50 Feet Assistive  device: Rolling walker Ambulation/Gait Assistance Details: Instructed pt in proper technique with NWB on R LE. Pt able to return demonstrate. Repeated cues to "take your time" for safety Gait Pattern: Step-to pattern Stairs: Yes Stairs Assistance: 5: Supervision Stairs Assistance Details (indicate cue type and reason): Instructed pt in proper technique with NWB on R LE. Pt able to return demonstrate. Repeated cues to "take your time" for safety Stair Management Technique: Two rails Number of Stairs: 4  Wheelchair Mobility Wheelchair Mobility: No    Exercises     PT Diagnosis:    PT Problem List:   PT Treatment Interventions:     PT Goals Acute Rehab PT Goals PT Goal Formulation: With patient  Visit Information  Last PT Received On: 06/01/12 Assistance Needed: +1    Subjective Data  Subjective: You think this is my first time doing this  Patient Stated Goal: Return to home.    Prior Functioning  Home Living Lives With: Family Available Help at Discharge: Family Type of Home: Mobile home Home Access: Stairs to enter Secretary/administrator of Steps: 8 Entrance Stairs-Rails: Right;Left;Can reach both Home Layout: One level Bathroom Shower/Tub: Forensic scientist: Standard Bathroom Accessibility: Yes How Accessible: Accessible via walker Home Adaptive Equipment: Straight cane Prior Function Level of Independence: Independent Able to Take Stairs?: Yes Driving: Yes Vocation: Part time employment Communication Communication: No difficulties Dominant Hand: Right    Cognition  Overall Cognitive Status: Appears within functional limits for tasks assessed/performed Arousal/Alertness: Awake/alert Orientation Level: Oriented X4 / Intact Behavior During Session: [facility] for tasks performed  Extremity/Trunk Assessment Right Upper Extremity Assessment RUE ROM/Strength/Tone: Within functional levels Left Upper Extremity Assessment LUE ROM/Strength/Tone:  Within functional levels Right Lower Extremity Assessment RLE ROM/Strength/Tone: Unable to fully assess Left Lower Extremity Assessment LLE ROM/Strength/Tone: Within functional levels Trunk Assessment Trunk Assessment: Normal   Balance Balance Balance Assessed: No  End of Session PT - End of Session Equipment Utilized During Treatment: Gait belt Activity Tolerance: Patient tolerated treatment well Patient left: in bed;with call bell/phone within reach Nurse Communication: Mobility status   Jaslene Marsteller 06/01/2012, 12:41 PM  Redell Bhandari L. Morine Kohlman DPT [phone]

## 2012-06-02 DIAGNOSIS — Z9119 Patient's noncompliance with other medical treatment and regimen: Secondary | ICD-10-CM

## 2012-06-02 DIAGNOSIS — L02419 Cutaneous abscess of limb, unspecified: Secondary | ICD-10-CM

## 2012-06-02 DIAGNOSIS — E1165 Type 2 diabetes mellitus with hyperglycemia: Secondary | ICD-10-CM

## 2012-06-02 DIAGNOSIS — L03119 Cellulitis of unspecified part of limb: Secondary | ICD-10-CM

## 2012-06-02 LAB — CBC
HCT: 35.3 % — ABNORMAL LOW (ref 39.0–52.0)
MCHC: 33.7 g/dL (ref 30.0–36.0)
MCV: 84.2 fL (ref 78.0–100.0)
RDW: 13.7 % (ref 11.5–15.5)

## 2012-06-02 LAB — BASIC METABOLIC PANEL
BUN: 11 mg/dL (ref 6–23)
CO2: 30 mEq/L (ref 19–32)
Calcium: 8.9 mg/dL (ref 8.4–10.5)
Chloride: 96 mEq/L (ref 96–112)
Creatinine, Ser: 0.65 mg/dL (ref 0.50–1.35)
GFR calc Af Amer: >90 mL/min (ref >90)

## 2012-06-02 LAB — GLUCOSE, CAPILLARY: Glucose-Capillary: 208 mg/dL — ABNORMAL HIGH (ref 70–99)

## 2012-06-02 LAB — PROTIME-INR: Prothrombin Time: 14.1 seconds (ref 11.6–15.2)

## 2012-06-02 MED ORDER — POTASSIUM CHLORIDE CRYS ER 20 MEQ PO TBCR
40.0000 meq | EXTENDED_RELEASE_TABLET | Freq: Four times a day (QID) | ORAL | Status: AC
  Administered 2012-06-02: 40 meq via ORAL
  Filled 2012-06-02 (×2): qty 2

## 2012-06-02 MED ORDER — WARFARIN SODIUM 7.5 MG PO TABS
15.0000 mg | ORAL_TABLET | Freq: Once | ORAL | Status: DC
  Filled 2012-06-02: qty 2

## 2012-06-02 NOTE — Progress Notes (Addendum)
ANTICOAGULATION CONSULT NOTE - Follow Up Consult Pharmacy Consult for Coumadin Indication: VTE prophylaxis  No Known Allergies    Vital Signs: Temp: 98.1 F (36.7 C) (06/16 0719) Temp src: Oral (06/16 0719) BP: 125/75 mmHg (06/16 0719) Pulse Rate: 88  (06/16 0719)  Labs:  Basename 06/02/12 0515 06/01/12 1320 05/31/12 0600  HGB 11.9* -- 13.1  HCT 35.3* -- 38.1*  PLT 293 -- 233  APTT -- -- --  LABPROT 14.1 13.8 --  INR 1.07 1.04 --  HEPARINUNFRC -- -- --  CREATININE 0.65 -- 0.69  CKTOTAL -- -- --  CKMB -- -- --  TROPONINI -- -- --    The CrCl is unknown because both a height and weight (above a minimum accepted value) are required for this calculation.      Assessment: 41yo male with chronic osteomyelitis and toe amputations. He is now s/p amputation of digit on the right foot. He will be started on anticoagulation due to reduced mobility and increased risk of thrombotic event. His last LFT's in April were normal and he has no history of liver dysfunction. His baseline CBC and platelets are WNL and there is no noted bleeding history.  Goal of Therapy:  INR 2-3 Monitor platelets by anticoagulation protocol: Yes   Plan:  Warfarin 15 mg at 1800 PT/INR daily Check vanco trough at 1530 today Lucille Passy 06/02/2012,11:11 AM

## 2012-06-02 NOTE — Progress Notes (Signed)
DAILY PROGRESS NOTE                              [facility]  SUBJECTIVE: He was to make sure he gets better before he goes home. Saying that he came in to the hospital 3 times before it got treated diet he'll not needed the amputation. His questions was answered, told him his WBC is is improving.  OBJECTIVE: BP 125/75  Pulse 88  Temp 98.1 F (36.7 C) (Oral)  Resp 18  SpO2 99%  Intake/Output Summary (Last 24 hours) at 06/02/12 1058 Last data filed at 06/02/12 0730  Gross per 24 hour  Intake   1140 ml  Output      0 ml  Net   1140 ml                      Weight change:  Physical Exam: General: Alert and awake oriented x3 not in any acute distress. HEENT: anicteric sclera, pupils equal reactive to light and accommodation CVS: S1-S2 heard, no murmur rubs or gallops Chest: clear to auscultation bilaterally, no wheezing rales or rhonchi Abdomen:  normal bowel sounds, soft, nontender, nondistended, no organomegaly Neuro: Cranial nerves II-XII intact, no focal neurological deficits Extremities: no cyanosis, right foot bandages   Lab Results:  Basename 06/02/12 0515 05/31/12 0600  NA 135 134*  K 3.4* 3.4*  CL 96 95*  CO2 30 28  GLUCOSE 114* 176*  BUN 11 13  CREATININE 0.65 0.69  CALCIUM 8.9 9.3  MG -- --  PHOS -- --   No results found for this basename: AST:2,ALT:2,ALKPHOS:2,BILITOT:2,PROT:2,ALBUMIN:2 in the last 72 hours No results found for this basename: LIPASE:2,AMYLASE:2 in the last 72 hours  Basename 06/02/12 0515 05/31/12 0600  WBC 13.7* 22.4*  NEUTROABS -- --  HGB 11.9* 13.1  HCT 35.3* 38.1*  MCV 84.2 83.7  PLT 293 233   No results found for this basename: CKTOTAL:3,CKMB:3,CKMBINDEX:3,TROPONINI:3 in the last 72 hours No components found with this basename: POCBNP:3 No results found for this basename: DDIMER:2 in the last 72 hours  Basename 05/31/12 1428  HGBA1C 8.8*   No results found for this basename:  CHOL:2,HDL:2,LDLCALC:2,TRIG:2,CHOLHDL:2,LDLDIRECT:2 in the last 72 hours No results found for this basename: TSH,T4TOTAL,FREET3,T3FREE,THYROIDAB in the last 72 hours No results found for this basename: VITAMINB12:2,FOLATE:2,FERRITIN:2,TIBC:2,IRON:2,RETICCTPCT:2 in the last 72 hours  Micro Results: Recent Results (from the past 240 hour(s))  CULTURE, BLOOD (ROUTINE X 2)     Status: Normal (Preliminary result)   Collection Time   05/29/12 11:20 PM      Component Value Range Status Comment   Specimen Description BLOOD RIGHT ARM   Final    Special Requests BOTTLES DRAWN AEROBIC AND ANAEROBIC 10CC   Final    Culture  Setup Time [id number]   Final    Culture     Final    Value:        BLOOD CULTURE RECEIVED NO GROWTH TO DATE CULTURE WILL BE HELD FOR 5 DAYS BEFORE ISSUING A FINAL NEGATIVE REPORT   Report Status PENDING   Incomplete   CULTURE, BLOOD (ROUTINE X 2)     Status: Normal (Preliminary result)   Collection Time   05/29/12 11:40 PM      Component Value Range Status Comment   Specimen Description BLOOD LEFT ARM   Final    Special Requests BOTTLES DRAWN AEROBIC AND ANAEROBIC 10CC  Final    Culture  Setup Time [id number]   Final    Culture     Final    Value:        BLOOD CULTURE RECEIVED NO GROWTH TO DATE CULTURE WILL BE HELD FOR 5 DAYS BEFORE ISSUING A FINAL NEGATIVE REPORT   Report Status PENDING   Incomplete   SURGICAL PCR SCREEN     Status: Normal   Collection Time   05/30/12  5:21 PM      Component Value Range Status Comment   MRSA, PCR NEGATIVE  NEGATIVE Final    Staphylococcus aureus NEGATIVE  NEGATIVE Final     Studies/Results: Dg Foot Complete Right  05/30/2012  *RADIOLOGY REPORT*  Clinical Data: Foot pain  RIGHT FOOT COMPLETE - 3+ VIEW  Comparison: 04/12/2012  Findings: Amputation of the fourth digit at the metatarsophalangeal joint.  No acute fracture or dislocation identified.  Midfoot DJD. No radiopaque foreign body.  There is soft tissue swelling along the dorsum and  lateral aspect of the foot, with a few locules of gas laterally.  Posterior and plantar calcaneal enthesopathic changes.  IMPRESSION: Soft tissue swelling along the dorsum and lateral aspect of the foot.  There are a few locules of gas.  Correlate clinically for ulcer/cellulitis.  No underlying acute osseous finding.  If clinical concern for osteomyelitis persists, MRI follow-up recommended.  Original Report Authenticated By: Waneta Martins, M.D.   Medications: Scheduled Meds:    . aspirin EC  81 mg Oral Daily  . docusate sodium  100 mg Oral BID  . gabapentin  300 mg Oral BID  . glyBURIDE  5 mg Oral BID WC  . insulin aspart  0-15 Units Subcutaneous Q4H  . piperacillin-tazobactam (ZOSYN)  IV  3.375 g Intravenous Q8H  . vancomycin  1,000 mg Intravenous Q8H  . warfarin  10 mg Oral ONCE-1800  . Warfarin - Pharmacist Dosing Inpatient   Does not apply q1800   Continuous Infusions:    . sodium chloride 10 mL/hr at 05/31/12 1957  . 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 -Patient is on vancomycin and Zosyn. Continue current antibiotics awaiting for deep tissue cultures. -Status post right fifth ray amputation and debridement.  Diabetes mellitus type 2 -Hemoglobin A1c is 8.8 correlate with average blood glucose of 206. -His home medication glyburide continued, insulin sliding scale started.  -A lot of counseling about diabetic diet was done and patient refused to comply with diabetic diet. -He wanted to eat regular food, he said he will just order from  outside "I'm a junk food junky" if he is not getting what he wants.  Hypokalemia -Replete with oral supplements.  Leukocytosis -This is likely secondary to the diabetic foot ulcer.  Disposition -Likely Monday or Tuesday, if it's okay with Dr. Lajoyce Corners.   LOS: 4 days   Urho Rio A 06/02/2012, 10:58 AM

## 2012-06-02 NOTE — Progress Notes (Signed)
Subjective: 2 Days Post-Op Procedure(s) (LRB): AMPUTATION DIGIT (Right) dressing right foot required reinforcement. Does not desire for me to change his dressing daily instead Dr. Lajoyce Corners will work with this tomorrow. Again wishes to stay until his foot is healed. Patient reports pain as 2 on 0-10 scale.    Objective: Vital signs in last 24 hours: Temp:  [98 F (36.7 C)-98.2 F (36.8 C)] 98.1 F (36.7 C) (06/16 0719) Pulse Rate:  [82-93] 88  (06/16 0719) Resp:  [18-20] 18  (06/16 0719) BP: (100-128)/(67-83) 125/75 mmHg (06/16 0719) SpO2:  [94 %-100 %] 99 % (06/16 0719)  Intake/Output from previous day: 06/15 0701 - 06/16 0700 In: 1320 [P.O.:1320] Out: -  Intake/Output this shift: Total I/O In: 240 [P.O.:240] Out: -    Basename 06/02/12 0515 05/31/12 0600  HGB 11.9* 13.1    Basename 06/02/12 0515 05/31/12 0600  WBC 13.7* 22.4*  RBC 4.19* 4.55  HCT 35.3* 38.1*  PLT 293 233    Basename 06/02/12 0515 05/31/12 0600  NA 135 134*  K 3.4* 3.4*  CL 96 95*  CO2 30 28  BUN 11 13  CREATININE 0.65 0.69  GLUCOSE 114* 176*  CALCIUM 8.9 9.3    Basename 06/02/12 0515 06/01/12 1320  LABPT -- --  INR 1.07 1.04    ABD soft Incision: moderate drainage No cellulitis present Compartment soft  Assessment/Plan: 2 Days Post-Op Procedure(s) (LRB): AMPUTATION DIGIT (Right) Advance diet Up with therapy D/C IV fluids Continue ABX therapy due to Post-op infection  Teryl Gubler E 06/02/2012, 11:21 AM

## 2012-06-03 ENCOUNTER — Encounter (HOSPITAL_COMMUNITY): Payer: Self-pay | Admitting: Orthopedic Surgery

## 2012-06-03 DIAGNOSIS — M86679 Other chronic osteomyelitis, unspecified ankle and foot: Secondary | ICD-10-CM

## 2012-06-03 DIAGNOSIS — L03119 Cellulitis of unspecified part of limb: Secondary | ICD-10-CM

## 2012-06-03 DIAGNOSIS — L02419 Cutaneous abscess of limb, unspecified: Secondary | ICD-10-CM

## 2012-06-03 DIAGNOSIS — E1165 Type 2 diabetes mellitus with hyperglycemia: Secondary | ICD-10-CM

## 2012-06-03 DIAGNOSIS — Z9119 Patient's noncompliance with other medical treatment and regimen: Secondary | ICD-10-CM

## 2012-06-03 LAB — GLUCOSE, CAPILLARY: Glucose-Capillary: 269 mg/dL — ABNORMAL HIGH (ref 70–99)

## 2012-06-03 MED ORDER — HYDROCODONE-ACETAMINOPHEN 5-500 MG PO TABS: 1.0000 | ORAL_TABLET | Freq: Four times a day (QID) | ORAL | Status: DC | PRN

## 2012-06-03 MED ORDER — DOXYCYCLINE HYCLATE 100 MG PO TABS: 100.0000 mg | ORAL_TABLET | Freq: Two times a day (BID) | ORAL | Status: AC

## 2012-06-03 MED ORDER — CIPROFLOXACIN HCL 500 MG PO TABS: 500.0000 mg | ORAL_TABLET | Freq: Two times a day (BID) | ORAL | Status: DC

## 2012-06-03 NOTE — Progress Notes (Signed)
Patient ID: Derrick [name], male   DOB: 15-Oct-1971, 41 y.o.   MRN: [id number] Will have right foot dressing changed today. Okay for discharge to home. I'll followup in the office in 2 weeks. Strict elevation and nonweightbearing right lower extremity.

## 2012-06-03 NOTE — Progress Notes (Signed)
ANTICOAGULATION CONSULT NOTE - Follow Up Consult Pharmacy Consult for Coumadin/vanc/zosyn Indication: VTE prophylaxis  No Known Allergies    Vital Signs: Temp: 97.5 F (36.4 C) (06/17 0700) Temp src: Oral (06/17 0700) BP: 114/79 mmHg (06/17 0700) Pulse Rate: 78  (06/17 0700)  Labs:  Basename 06/02/12 0515 06/01/12 1320  HGB 11.9* --  HCT 35.3* --  PLT 293 --  APTT -- --  LABPROT 14.1 13.8  INR 1.07 1.04  HEPARINUNFRC -- --  CREATININE 0.65 --  CKTOTAL -- --  CKMB -- --  TROPONINI -- --    The CrCl is unknown because both a height and weight (above a minimum accepted value) are required for this calculation.      Assessment: 41yo male with chronic osteomyelitis and toe amputations. He is now s/p amputation of digit on the right foot. Pt has been uncooperative per RN. Apparently didn't get coumadin dose last night. D/w Dr. Lajoyce Corners, no further coumadin needed. Currently on IV vanc/zosyn here. Dr. Lajoyce Corners suggest something like Doxy for home. Vanc trough was done yesterday. It was below goal but pt is leaving today.   Goal of Therapy:  INR 2-3 Monitor platelets by anticoagulation protocol: Yes   Plan:  Cont current vanc/zosyn until dc Dc coumadin Ulyses [place] 06/03/2012,1:19 PM

## 2012-06-03 NOTE — Discharge Summary (Signed)
HOSPITAL DISCHARGE SUMMARY  Derrick [name]  MRN: [id number]  DOB:1971-05-04  Date of Admission: 05/29/2012 Date of Discharge: 06/03/2012         LOS: 5 days   Attending Physician:  Clydia Llano A  Patient's PCP:  [name], MD  Consults: Dr Vianne Bulls, orthopedics  Discharge Diagnoses: Present on Admission:  .Cellulitis .Diabetic toe ulcer .Tobacco abuse .Leukocytosis .Diabetes mellitus .Chronic osteomyelitis involving ankle and foot   Medication List  As of 06/03/2012  1:56 PM   STOP taking these medications         promethazine 12.5 MG tablet         TAKE these medications         ciprofloxacin 500 MG tablet   Commonly known as: CIPRO   Take 1 tablet (500 mg total) by mouth 2 (two) times daily. Start date 04/12/12; duration unknown      doxycycline 100 MG tablet   Commonly known as: VIBRA-TABS   Take 1 tablet (100 mg total) by mouth 2 (two) times daily.      gabapentin 300 MG capsule   Commonly known as: NEURONTIN   Take 1 capsule (300 mg total) by mouth 2 (two) times daily. For neuropathy      glyBURIDE 5 MG tablet   Commonly known as: DIABETA   Take 1 tablet (5 mg total) by mouth 2 (two) times daily with a meal.      HYDROcodone-acetaminophen 5-500 MG per tablet   Commonly known as: VICODIN   Take 1 tablet by mouth every 6 (six) hours as needed for pain. For pain      ibuprofen 200 MG tablet   Commonly known as: ADVIL,MOTRIN   Take 400 mg by mouth every 6 (six) hours as needed. For pain      metFORMIN 500 MG tablet   Commonly known as: GLUCOPHAGE   Take 1 tablet (500 mg total) by mouth 2 (two) times daily with a meal.           Procedures:  Right foot fifth ray amputation, with debridement and tissue flap done by Dr. Aldean Baker.   Brief Admission History: Derrick [name] is an 41 y.o. male with history of diabetes, status post multiple toes amputation, having a right diabetic foot ulcer, presents to emergency room with increased  redness, swelling, pain of his right foot. Evaluation in emergency room included a white count of 30,000, creatinine of 0.9, and blood sugar of 209. Hospitalist was asked to admit patient for treatment of diabetic cellulitis. He has been on Cipro as outpatient. He denied chest pain, shortness of breath, fever, chills, or any other symptomology.  Hospital Course: Present on Admission:  .Cellulitis .Diabetic toe ulcer .Tobacco abuse .Leukocytosis .Diabetes mellitus .Chronic osteomyelitis involving ankle and foot   1. Diabetic foot ulcer with chronic osteomyelitis involving the base of the right fifth toe: Patient admitted to the hospital and started on vancomycin and Zosyn. Dr. Lajoyce Corners from orthopedics was consulted. Patient undergone surgery on 05/31/2012 with amputation of right foot fifth ray and local tissue rearrangement for flap coverage for one which is 6 cm in diameter. Patient received physical therapy after that in the hospital IV antibiotics continued for 5 days on the day of discharge patient discharged on oral doxycycline and ciprofloxacin for 14 more days. Patient to followup with Dr. Lajoyce Corners in his office.  2. Diabetes mellitus type 2, uncontrolled: Patient is on oral hypoglycemic agents including metformin and glyburide. Patient hemoglobin A1c is 8.8  indicating poor glycemic control. Patient counseled and educated more than once about his diet and adherence to his diabetic diet.  3. Hypokalemia: Patient did have hypokalemia while he is in the hospital and that was repleted with oral supplementation.  4. Leukocytosis: At the time of admission patient wbc's was over 30,000 and after the issue patient of IV antibiotics the white blood count went down to 13 K yesterday, patient refused to have his blood drawn this morning.  5. Noncompliance: While patient in the hospital he was very noncompliance with hospital policy his stools. He he did not eat the hospital diabetic diet and he was bringing  food from outside, did not want to take Coumadin for DVT prophylaxis that indicated by the orthopedic surgeon, patient refusing blood draws in the morning saying that he is resting at that time. Patient was also very confrontational with the nursing staff. While the registered dietitian is trying to educate him about diabetic diet he was sipping from his regular Mountain Dew soda bottle.  Day of Discharge BP 114/79  Pulse 78  Temp 97.5 F (36.4 C) (Oral)  Resp 18  SpO2 91% Physical Exam: GEN: No acute distress, cooperative with exam PSYCH: He is alert and oriented x4; does not appear anxious does not appear depressed; affect is normal  HEENT: Mucous membranes pink and anicteric;  Mouth: without oral thrush or lesions Eyes: PERRLA; EOM intact;  Neck: no cervical lymphadenopathy nor thyromegaly or carotid bruit; no JVD;  CHEST WALL: No tenderness, symmetrical to breathing bilaterally CHEST: Normal respiration, clear to auscultation bilaterally  HEART: Regular rate and rhythm; no murmurs, rubs or gallops, S1 and S2 heard  BACK: No kyphosis or scoliosis; no CVA tenderness  ABDOMEN:  soft non-tender; no masses, no organomegaly, normal abdominal bowel sounds; no pannus; no intertriginous candida.  EXTREMITIES: No bone or joint deformity; no edema; no ulcerations.  PULSES: 2+ and symmetric, neurovascularity is intact SKIN: Normal hydration no rash or ulceration, no flushing or suspicious lesions  CNS: Cranial nerves 2-12 grossly intact no focal neurologic deficit, coordination is intact gait not tested    Results for orders placed during the hospital encounter of 05/29/12 (from the past 24 hour(s))  VANCOMYCIN, TROUGH     Status: Abnormal   Collection Time   06/02/12  3:20 PM      Component Value Range   Vancomycin Tr 7.7 (*) 10.0 - 20.0 ug/mL  GLUCOSE, CAPILLARY     Status: Abnormal   Collection Time   06/02/12  4:34 PM      Component Value Range   Glucose-Capillary 208 (*) 70 - 99  mg/dL   Comment 1 Notify RN    GLUCOSE, CAPILLARY     Status: Abnormal   Collection Time   06/02/12  8:55 PM      Component Value Range   Glucose-Capillary 255 (*) 70 - 99 mg/dL  GLUCOSE, CAPILLARY     Status: Abnormal   Collection Time   06/03/12  7:11 AM      Component Value Range   Glucose-Capillary 269 (*) 70 - 99 mg/dL   Comment 1 Documented in Chart     Comment 2 Notify RN    GLUCOSE, CAPILLARY     Status: Abnormal   Collection Time   06/03/12 11:09 AM      Component Value Range   Glucose-Capillary 255 (*) 70 - 99 mg/dL   Comment 1 Documented in Chart     Comment 2 Notify RN  Disposition: Home   Follow-up Appts: Discharge Orders    Future Orders Please Complete By Expires   Diet Carb Modified      Increase activity slowly         Follow-up Information    Follow up with DUDA,MARCUS V, MD in 2 weeks.   Contact information:   [address] [place] [address] [phone]       Follow up with Irene Limbo, NP in 1 week.   Contact information:   [facility]   [phone]           I spent 40 minutes completing paperwork and coordinating discharge efforts.  SignedClydia Llano A 06/03/2012, 1:56 PM

## 2012-06-03 NOTE — Discharge Instructions (Signed)
Elevation and strict nonweightbearing right lower extremity. 

## 2012-06-03 NOTE — Progress Notes (Signed)
CARE MANAGEMENT NOTE 06/03/2012  Patient:  Derrick [name], Derrick [name]   Account Number:  [id number]  Date Initiated:  06/03/2012  Documentation initiated by:  Vance Peper  Subjective/Objective Assessment:   41 yr old male s/p right foot 5th ray amputation     Action/Plan:   CM spoke with patient regarding DME needs. Patient states he doesnt need/want a rolling walker. CM asked how was he going to get around states "he wont be doning much other than going to bathroom, he doesnt want it". CM signing off.   Anticipated DC Date:  06/03/2012   Anticipated DC Plan:  HOME/SELF CARE      DC Planning Services  CM consult      PAC Choice  NA   Choice offered to / List presented to:             Status of service:  Completed, signed off Discharge Disposition:  HOME/SELF CARE

## 2012-06-05 LAB — CULTURE, BLOOD (ROUTINE X 2)
Culture  Setup Time: 201306130343
Culture  Setup Time: 201306130343
Culture: NO GROWTH
Culture: NO GROWTH

## 2012-07-10 IMAGING — CR DG SHOULDER 2+V*L*
3 series · 3 of 3 positions shown · non-contrast
Comparison: None.

CLINICAL DATA: Worsening left shoulder pain over the past 2 weeks.

LEFT SHOULDER - 2+ VIEW

[w shoulder internal left]
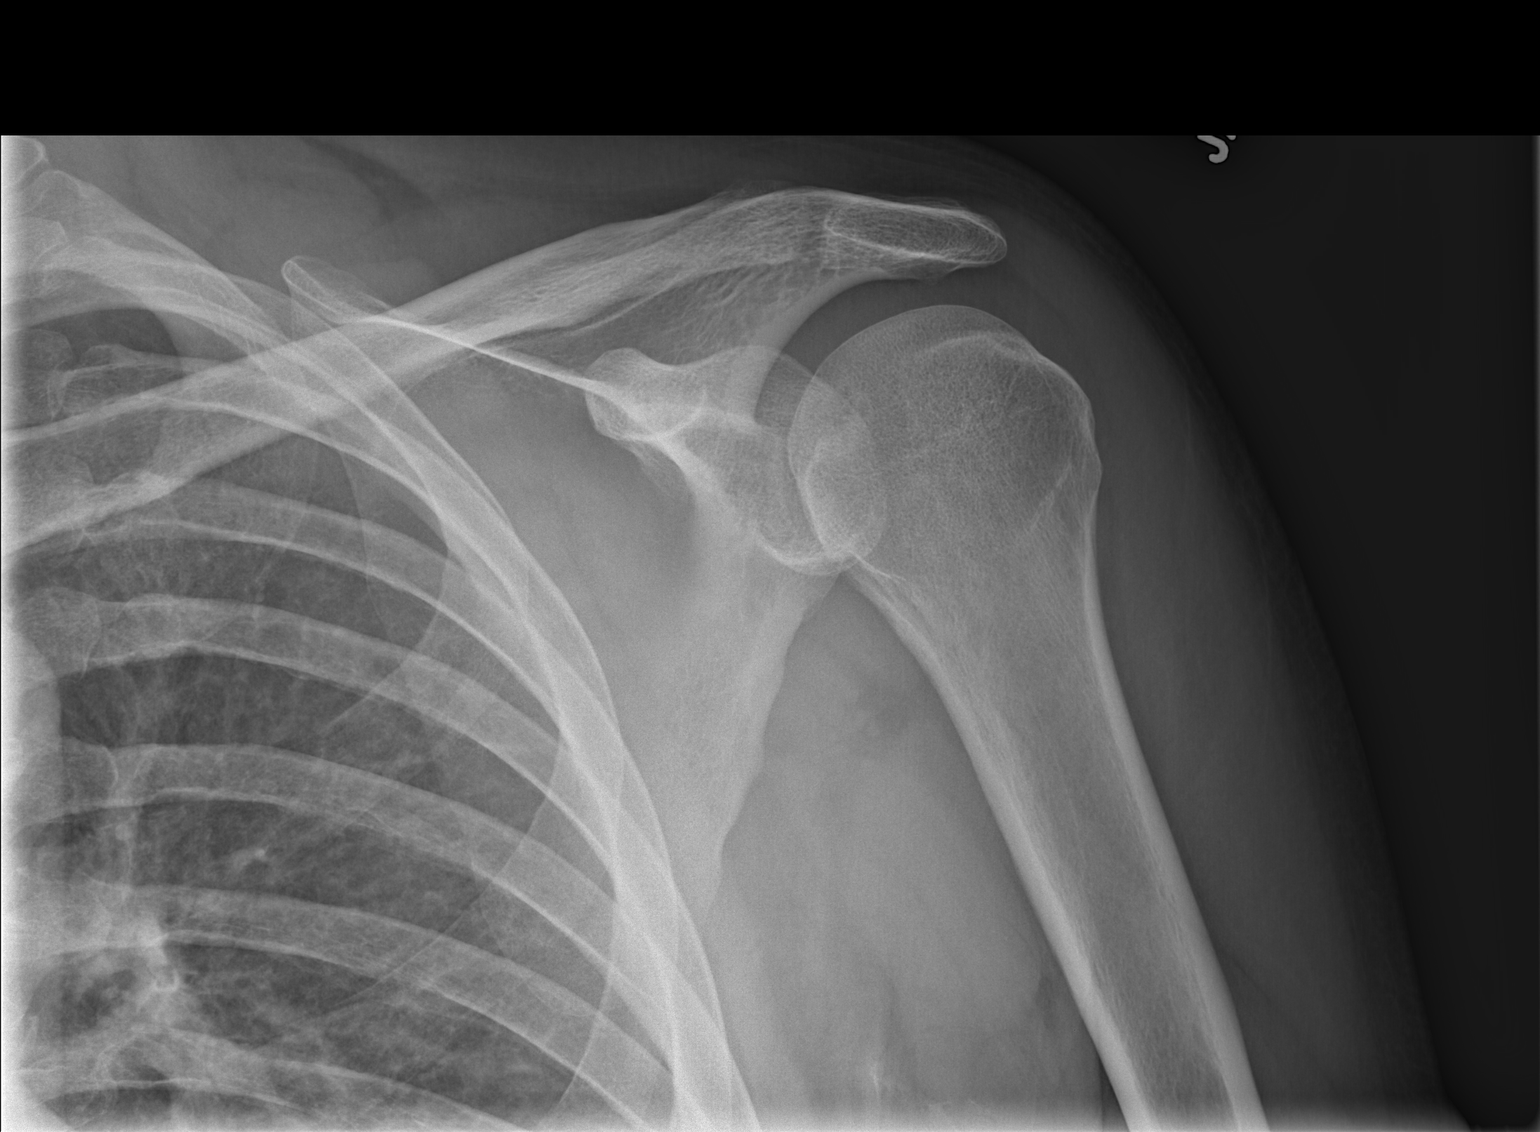

[w shoulder external left]
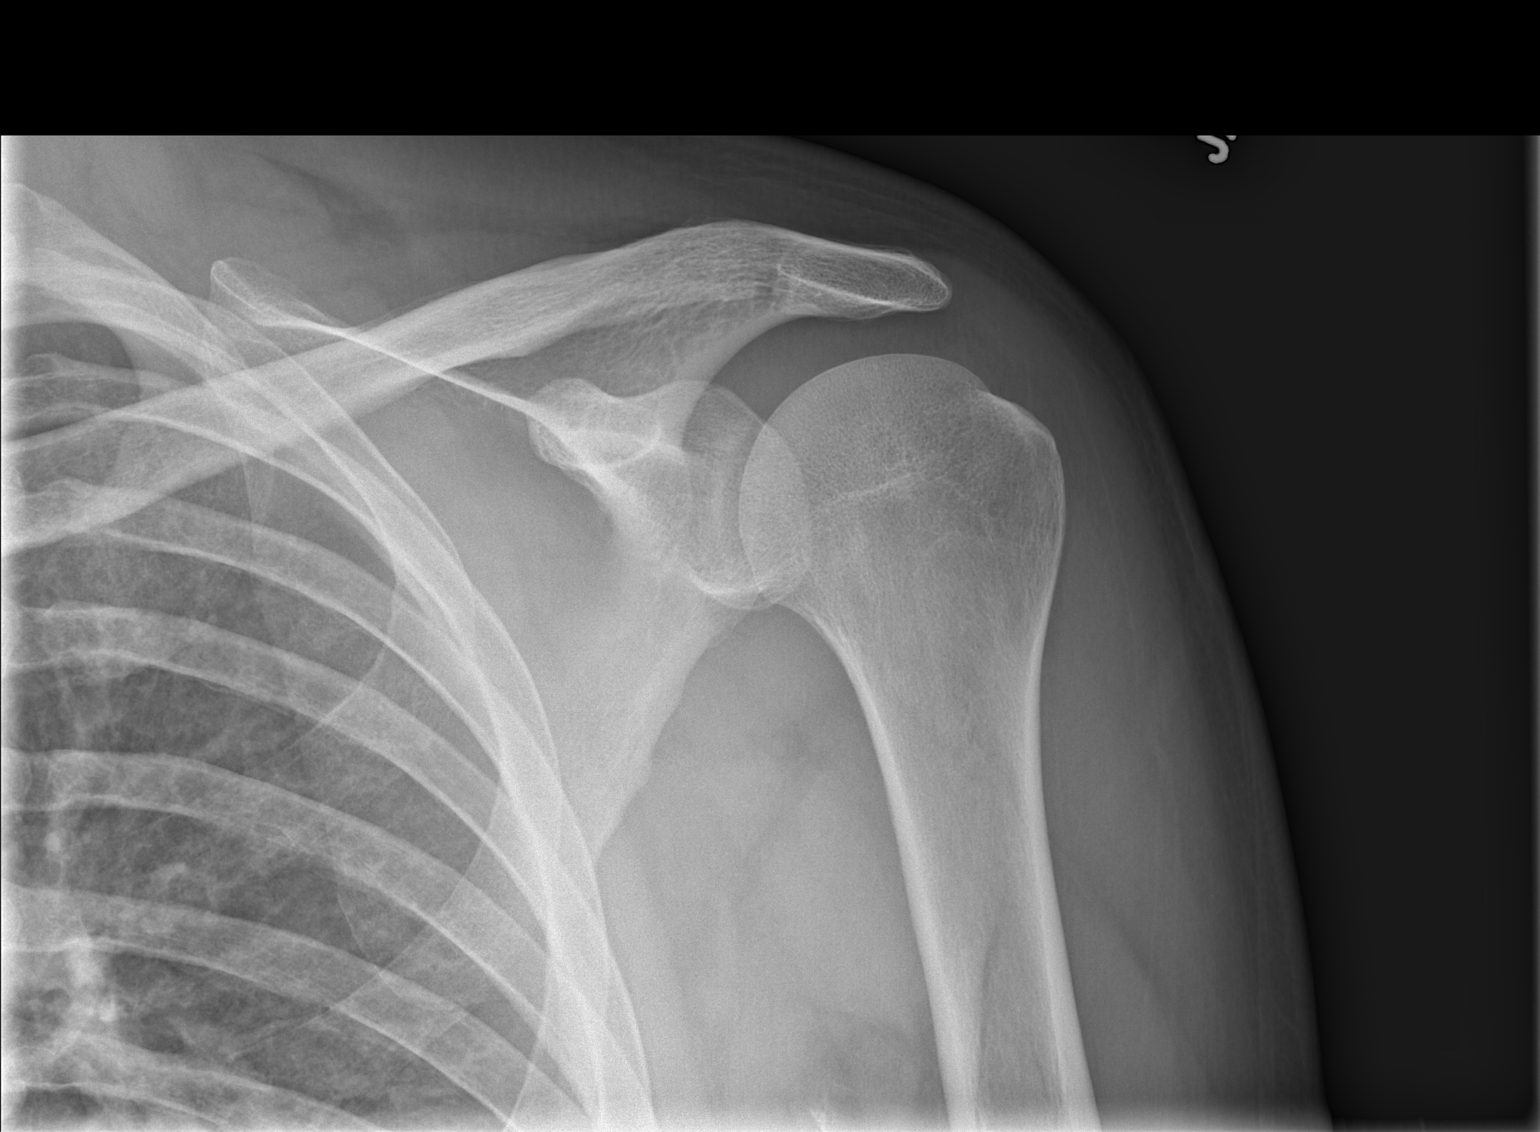

[w shoulder y-view left]
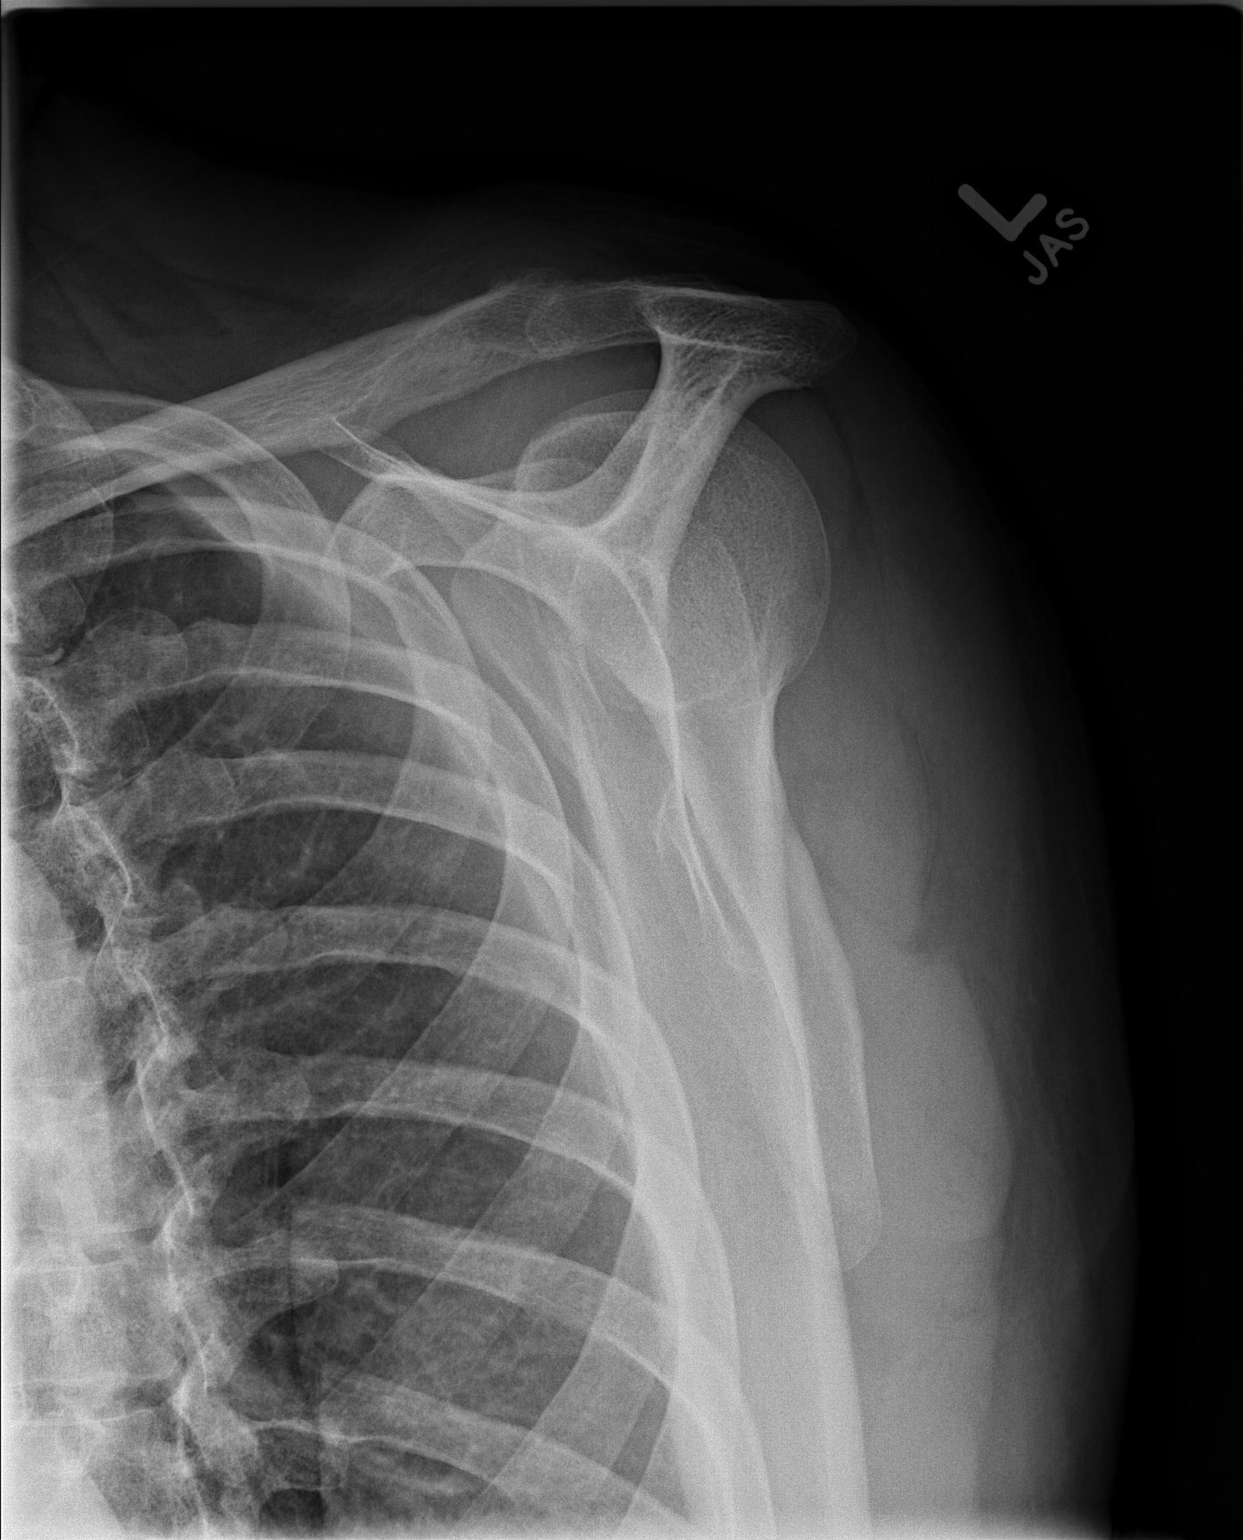

[3 of 3 positions shown; findings below may reference images not displayed]

FINDINGS: Imaged bones, joints and soft tissues appear normal.
IMPRESSION: Normal study.

## 2012-08-01 ENCOUNTER — Other Ambulatory Visit (HOSPITAL_COMMUNITY): Payer: Self-pay | Admitting: Orthopedic Surgery

## 2012-08-01 ENCOUNTER — Encounter (HOSPITAL_COMMUNITY): Payer: Self-pay | Admitting: *Deleted

## 2012-08-01 MED ORDER — CEFAZOLIN SODIUM-DEXTROSE 2-3 GM-% IV SOLR
2.0000 g | INTRAVENOUS | Status: AC
  Administered 2012-08-02: 2 g via INTRAVENOUS
  Filled 2012-08-01 (×2): qty 50

## 2012-08-02 ENCOUNTER — Ambulatory Visit (HOSPITAL_COMMUNITY): Payer: Medicaid Other | Admitting: Anesthesiology

## 2012-08-02 ENCOUNTER — Encounter (HOSPITAL_COMMUNITY)
Admission: RE | Disposition: A | Discharge status: Home / Self Care | Payer: Self-pay | Source: Ambulatory Visit | Attending: Orthopedic Surgery

## 2012-08-02 ENCOUNTER — Encounter (HOSPITAL_COMMUNITY): Payer: Self-pay | Admitting: Anesthesiology

## 2012-08-02 ENCOUNTER — Ambulatory Visit (HOSPITAL_COMMUNITY)
Admission: RE | Admit: 2012-08-02 | Discharge: 2012-08-02 | Disposition: A | Discharge status: Home / Self Care | Payer: Medicaid Other | Source: Ambulatory Visit | Attending: Orthopedic Surgery | Admitting: Orthopedic Surgery

## 2012-08-02 ENCOUNTER — Ambulatory Visit (HOSPITAL_COMMUNITY): Payer: Medicaid Other

## 2012-08-02 DIAGNOSIS — F17200 Nicotine dependence, unspecified, uncomplicated: Secondary | ICD-10-CM | POA: Insufficient documentation

## 2012-08-02 DIAGNOSIS — I798 Other disorders of arteries, arterioles and capillaries in diseases classified elsewhere: Secondary | ICD-10-CM | POA: Insufficient documentation

## 2012-08-02 DIAGNOSIS — L03119 Cellulitis of unspecified part of limb: Secondary | ICD-10-CM | POA: Insufficient documentation

## 2012-08-02 DIAGNOSIS — E1159 Type 2 diabetes mellitus with other circulatory complications: Secondary | ICD-10-CM | POA: Insufficient documentation

## 2012-08-02 DIAGNOSIS — L0390 Cellulitis, unspecified: Secondary | ICD-10-CM

## 2012-08-02 DIAGNOSIS — E11621 Type 2 diabetes mellitus with foot ulcer: Secondary | ICD-10-CM

## 2012-08-02 DIAGNOSIS — L02619 Cutaneous abscess of unspecified foot: Secondary | ICD-10-CM | POA: Insufficient documentation

## 2012-08-02 HISTORY — PX: I & D EXTREMITY: SHX5045

## 2012-08-02 LAB — COMPREHENSIVE METABOLIC PANEL
ALT: 12 U/L (ref 0–53)
Alkaline Phosphatase: 147 U/L — ABNORMAL HIGH (ref 39–117)
CO2: 28 mEq/L (ref 19–32)
Chloride: 99 mEq/L (ref 96–112)
GFR calc Af Amer: >90 mL/min (ref >90)
GFR calc non Af Amer: >90 mL/min (ref >90)
Glucose, Bld: 334 mg/dL — ABNORMAL HIGH (ref 70–99)
Potassium: 3.7 mEq/L (ref 3.5–5.1)
Sodium: 137 mEq/L (ref 135–145)
Total Bilirubin: 0.3 mg/dL (ref 0.3–1.2)

## 2012-08-02 LAB — SURGICAL PCR SCREEN: Staphylococcus aureus: POSITIVE — AB

## 2012-08-02 LAB — CBC
Hemoglobin: 16.6 g/dL (ref 13.0–17.0)
RBC: 5.62 MIL/uL (ref 4.22–5.81)

## 2012-08-02 LAB — APTT: aPTT: 26 seconds (ref 24–37)

## 2012-08-02 SURGERY — IRRIGATION AND DEBRIDEMENT EXTREMITY
Anesthesia: General | Site: Foot | Laterality: Right | Wound class: Dirty or Infected

## 2012-08-02 MED ORDER — GENTAMICIN SULFATE 40 MG/ML IJ SOLN
INTRAMUSCULAR | Status: AC
  Filled 2012-08-02: qty 6

## 2012-08-02 MED ORDER — SODIUM CHLORIDE 0.9 % IR SOLN
Status: DC | PRN
  Administered 2012-08-02: 3000 mL

## 2012-08-02 MED ORDER — FENTANYL CITRATE 0.05 MG/ML IJ SOLN
INTRAMUSCULAR | Status: DC | PRN
  Administered 2012-08-02: 100 ug via INTRAVENOUS

## 2012-08-02 MED ORDER — MIDAZOLAM HCL 5 MG/5ML IJ SOLN
INTRAMUSCULAR | Status: DC | PRN
  Administered 2012-08-02: 2 mg via INTRAVENOUS

## 2012-08-02 MED ORDER — ONDANSETRON HCL 4 MG/2ML IJ SOLN: 4.0000 mg | Freq: Once | INTRAMUSCULAR | Status: DC | PRN

## 2012-08-02 MED ORDER — ONDANSETRON HCL 4 MG/2ML IJ SOLN: INTRAMUSCULAR | Status: DC | PRN

## 2012-08-02 MED ORDER — VANCOMYCIN HCL 500 MG IV SOLR
INTRAVENOUS | Status: DC | PRN
  Administered 2012-08-02: 500 mg

## 2012-08-02 MED ORDER — GENTAMICIN SULFATE 40 MG/ML IJ SOLN
INTRAMUSCULAR | Status: DC | PRN
  Administered 2012-08-02: 4 mL

## 2012-08-02 MED ORDER — HYDROMORPHONE HCL PF 1 MG/ML IJ SOLN: 0.2500 mg | INTRAMUSCULAR | Status: DC | PRN

## 2012-08-02 MED ORDER — MUPIROCIN 2 % EX OINT
TOPICAL_OINTMENT | CUTANEOUS | Status: AC
  Administered 2012-08-02: 11:00:00 via NASAL
  Filled 2012-08-02: qty 22

## 2012-08-02 MED ORDER — LACTATED RINGERS IV SOLN
INTRAVENOUS | Status: DC
  Administered 2012-08-02: 12:00:00 via INTRAVENOUS

## 2012-08-02 MED ORDER — LIDOCAINE HCL (CARDIAC) 20 MG/ML IV SOLN
INTRAVENOUS | Status: DC | PRN
  Administered 2012-08-02: 100 mg via INTRAVENOUS

## 2012-08-02 MED ORDER — ONDANSETRON HCL 4 MG/2ML IJ SOLN
INTRAMUSCULAR | Status: DC | PRN
  Administered 2012-08-02: 4 mg via INTRAVENOUS

## 2012-08-02 MED ORDER — VANCOMYCIN HCL 500 MG IV SOLR
INTRAVENOUS | Status: AC
  Filled 2012-08-02: qty 500

## 2012-08-02 MED ORDER — LACTATED RINGERS IV SOLN
INTRAVENOUS | Status: DC | PRN
  Administered 2012-08-02: 13:00:00 via INTRAVENOUS

## 2012-08-02 MED ORDER — MUPIROCIN 2 % EX OINT: TOPICAL_OINTMENT | Freq: Two times a day (BID) | CUTANEOUS | Status: DC

## 2012-08-02 MED ORDER — VANCOMYCIN HCL 1000 MG IV SOLR
INTRAVENOUS | Status: AC
  Filled 2012-08-02: qty 1000

## 2012-08-02 MED ORDER — PROPOFOL 10 MG/ML IV BOLUS
INTRAVENOUS | Status: DC | PRN
  Administered 2012-08-02: 200 mg via INTRAVENOUS

## 2012-08-02 MED ORDER — MUPIROCIN 2 % EX OINT
TOPICAL_OINTMENT | CUTANEOUS | Status: AC
  Filled 2012-08-02: qty 22

## 2012-08-02 SURGICAL SUPPLY — 49 items
BANDAGE GAUZE ELAST BULKY 4 IN (GAUZE/BANDAGES/DRESSINGS) ×1 IMPLANT
BLADE SURG 10 STRL SS (BLADE) ×2 IMPLANT
BNDG COHESIVE 4X5 TAN STRL (GAUZE/BANDAGES/DRESSINGS) ×2 IMPLANT
BNDG COHESIVE 6X5 TAN STRL LF (GAUZE/BANDAGES/DRESSINGS) ×3 IMPLANT
BNDG GAUZE STRTCH 6 (GAUZE/BANDAGES/DRESSINGS) ×3 IMPLANT
CLOTH BEACON ORANGE TIMEOUT ST (SAFETY) ×2 IMPLANT
COTTON STERILE ROLL (GAUZE/BANDAGES/DRESSINGS) ×1 IMPLANT
COVER SURGICAL LIGHT HANDLE (MISCELLANEOUS) ×2 IMPLANT
CUFF TOURNIQUET SINGLE 18IN (TOURNIQUET CUFF) ×1 IMPLANT
CUFF TOURNIQUET SINGLE 24IN (TOURNIQUET CUFF) IMPLANT
CUFF TOURNIQUET SINGLE 34IN LL (TOURNIQUET CUFF) IMPLANT
CUFF TOURNIQUET SINGLE 44IN (TOURNIQUET CUFF) IMPLANT
DRAPE U-SHAPE 47X51 STRL (DRAPES) ×2 IMPLANT
DRSG ADAPTIC 3X8 NADH LF (GAUZE/BANDAGES/DRESSINGS) ×2 IMPLANT
DRSG PAD ABDOMINAL 8X10 ST (GAUZE/BANDAGES/DRESSINGS) ×1 IMPLANT
DURAPREP 26ML APPLICATOR (WOUND CARE) ×2 IMPLANT
ELECT CAUTERY BLADE 6.4 (BLADE) IMPLANT
ELECT REM PT RETURN 9FT ADLT (ELECTROSURGICAL) ×2
ELECTRODE REM PT RTRN 9FT ADLT (ELECTROSURGICAL) IMPLANT
GENTAMICIN 80MG/2ML IMPLANT
GLOVE BIOGEL PI IND STRL 9 (GLOVE) ×1 IMPLANT
GLOVE BIOGEL PI INDICATOR 9 (GLOVE) ×1
GLOVE SURG ORTHO 9.0 STRL STRW (GLOVE) ×2 IMPLANT
GOWN PREVENTION PLUS XLARGE (GOWN DISPOSABLE) ×2 IMPLANT
GOWN SRG XL XLNG 56XLVL 4 (GOWN DISPOSABLE) ×1 IMPLANT
GOWN STRL NON-REIN XL XLG LVL4 (GOWN DISPOSABLE) ×2
HANDPIECE INTERPULSE COAX TIP (DISPOSABLE) ×2
KIT BASIN OR (CUSTOM PROCEDURE TRAY) ×2 IMPLANT
KIT ROOM TURNOVER OR (KITS) ×2 IMPLANT
KIT STIMULAN RAPID CURE 5CC (Orthopedic Implant) ×1 IMPLANT
MANIFOLD NEPTUNE II (INSTRUMENTS) ×2 IMPLANT
NEEDLE 22X1 1/2 (OR ONLY) (NEEDLE) ×1 IMPLANT
NS IRRIG 1000ML POUR BTL (IV SOLUTION) ×2 IMPLANT
PACK ORTHO EXTREMITY (CUSTOM PROCEDURE TRAY) ×2 IMPLANT
PAD ARMBOARD 7.5X6 YLW CONV (MISCELLANEOUS) ×4 IMPLANT
PADDING CAST COTTON 6X4 STRL (CAST SUPPLIES) ×1 IMPLANT
SET HNDPC FAN SPRY TIP SCT (DISPOSABLE) IMPLANT
SPONGE GAUZE 4X4 12PLY (GAUZE/BANDAGES/DRESSINGS) ×2 IMPLANT
SPONGE LAP 18X18 X RAY DECT (DISPOSABLE) ×2 IMPLANT
STOCKINETTE IMPERVIOUS 9X36 MD (GAUZE/BANDAGES/DRESSINGS) ×1 IMPLANT
SUT ETHILON 2 0 PSLX (SUTURE) ×1 IMPLANT
SYR 20CC LL (SYRINGE) ×1 IMPLANT
TOWEL OR 17X24 6PK STRL BLUE (TOWEL DISPOSABLE) ×2 IMPLANT
TOWEL OR 17X26 10 PK STRL BLUE (TOWEL DISPOSABLE) ×2 IMPLANT
TUBE ANAEROBIC SPECIMEN COL (MISCELLANEOUS) IMPLANT
TUBE CONNECTING 12X1/4 (SUCTIONS) ×2 IMPLANT
UNDERPAD 30X30 INCONTINENT (UNDERPADS AND DIAPERS) ×2 IMPLANT
WATER STERILE IRR 1000ML POUR (IV SOLUTION) ×2 IMPLANT
YANKAUER SUCT BULB TIP NO VENT (SUCTIONS) ×3 IMPLANT

## 2012-08-02 NOTE — Op Note (Signed)
OPERATIVE REPORT  DATE OF SURGERY: 08/02/2012  PATIENT:  Derrick [name],  41 y.o. male  PRE-OPERATIVE DIAGNOSIS:  Abscess right Foot  POST-OPERATIVE DIAGNOSIS:  Abscess right Foot  PROCEDURE:  Procedure(s): IRRIGATION AND DEBRIDEMENT EXTREMITY Excisional debridement skin soft tissue muscle and bone Placement of antibiotic beads with 500 mg vancomycin and 160 mg of gentamicin Local tissue rearrangement for wound closure with the wound 7 x 2 cm.  SURGEON:  Surgeon(s): Nadara Mustard, MD  ANESTHESIA:   general  EBL:  Minimal ML  SPECIMEN:  No Specimen  TOURNIQUET:  * No tourniquets in log *  PROCEDURE DETAILS: Patient is a 41 year old gentleman status post irrigation and debridement and ray amputation of the right foot for an abscess. Patient presents at this time with a proximal abscess and presents for repeat irrigation and debridement for the necrotic tissue. Risks and benefits of surgery were discussed patient states he understands and wished to proceed at this time. Description of procedure patient brought to the or and underwent a general anesthetic. After adequate levels of anesthesia were obtained patient's right lower extremity was prepped using DuraPrep draped into a sterile field. The wound was opened and extended. The necrotic abscess was debrided of skin soft tissue and the bone was debrided with a Ronjair for excisional debridement. The wound was irrigated with pulsatile lavage antibiotic beads were made and 5 cc of antibiotic beads were placed with 500 mg of vancomycin and 160 mg of gentamicin. The wound edges were closed with 2-0 nylon with local tissue rearrangement to close the wound. The wound was 7 cm x 3 cm. The wound was covered with Adaptic orthopedic site sponges AB dressing Kerlix and Coban patient was extubated taken to the PACU in stable condition plan for discharge to home.  PLAN OF CARE: Discharge to home after PACU  PATIENT DISPOSITION:  PACU -  hemodynamically stable.   Nadara Mustard, MD 08/02/2012 1:57 PM

## 2012-08-02 NOTE — Anesthesia Preprocedure Evaluation (Addendum)
Anesthesia Evaluation  Patient identified by MRN, date of birth, ID band Patient awake    Reviewed: Allergy & Precautions, H&P , NPO status , Patient's Chart, lab work & pertinent test results, reviewed documented beta blocker date and time   History of Anesthesia Complications Negative for: history of anesthetic complications  Airway Mallampati: II TM Distance: >3 FB Neck ROM: Full    Dental  (+) Missing, Dental Advisory Given, Teeth Intact and Poor Dentition,    Pulmonary Current Smoker,  breath sounds clear to auscultation        Cardiovascular negative cardio ROS  Rhythm:Regular Rate:Normal     Neuro/Psych negative neurological ROS  negative psych ROS   GI/Hepatic negative GI ROS, Neg liver ROS,   Endo/Other  Poorly Controlled, Type 2, Oral Hypoglycemic Agents  Renal/GU negative Renal ROS  negative genitourinary   Musculoskeletal negative musculoskeletal ROS (+)   Abdominal   Peds  Hematology negative hematology ROS (+)   Anesthesia Other Findings   Reproductive/Obstetrics negative OB ROS                         Anesthesia Physical Anesthesia Plan  ASA: III  Anesthesia Plan: General   Post-op Pain Management:    Induction: Intravenous  Airway Management Planned: LMA  Additional Equipment:   Intra-op Plan:   Post-operative Plan:   Informed Consent: I have reviewed the patients History and Physical, chart, labs and discussed the procedure including the risks, benefits and alternatives for the proposed anesthesia with the patient or authorized representative who has indicated his/her understanding and acceptance.   Dental advisory given  Plan Discussed with: CRNA, Surgeon and Anesthesiologist  Anesthesia Plan Comments:         Anesthesia Quick Evaluation

## 2012-08-02 NOTE — Progress Notes (Signed)
Attempted to administer pts mupirocin and pt refused to allow me to administer the ointment.  Explained to pt the importance of this treatment and stated "let me tell you , if it does come back positive I will not allow this done anyway"

## 2012-08-02 NOTE — Progress Notes (Signed)
Pt stated he would not cont mupiroson tx. Post op.

## 2012-08-02 NOTE — Progress Notes (Signed)
Orthopedic Tech Progress Note Patient Details:  Derrick [name] 1971-02-14 [id number]  Ortho Devices Type of Ortho Device: Postop boot Ortho Device/Splint Interventions: Application   Cammer, Mickie Bail 08/02/2012, 2:21 PM

## 2012-08-02 NOTE — Preoperative (Signed)
Beta Blockers   Reason not to administer Beta Blockers:Not Applicable 

## 2012-08-02 NOTE — Progress Notes (Signed)
Post op boot applied as ordered

## 2012-08-02 NOTE — H&P (Signed)
Derrick [name] is an 41 y.o. male.   Chief Complaint: Abscess infection right foot status post right amputation. HPI: Patient is a 41 year old gentleman diabetes peripheral vascular disease who is status post ray amputation. Patient developed a pair of abscess proximal to his surgical incision secondary to necrotic tissue. Plan for irrigation debridement and placement of antibiotic beads.  Past Medical History  Diagnosis Date  . Diabetes mellitus   . Prostatitis     Past Surgical History  Procedure Date  . Toe amputation     multiple toes amputated.  . Amputation 05/31/2012    Procedure: AMPUTATION DIGIT;  Surgeon: Nadara Mustard, MD;  Location: [facility] OR;  Service: Orthopedics;  Laterality: Right;  right fourth and fifth toe ray amputation     History reviewed. No pertinent family history. Social History:  reports that he has been smoking Cigarettes.  He has a 46 pack-year smoking history. He has never used smokeless tobacco. He reports that he does not drink alcohol or use illicit drugs.  Allergies: No Known Allergies  Medications Prior to Admission  Medication Sig Dispense Refill  . ciprofloxacin (CIPRO) 500 MG tablet Take 500 mg by mouth 2 (two) times daily. Start date 04/12/12; duration unknown      . gabapentin (NEURONTIN) 300 MG capsule Take 1 capsule (300 mg total) by mouth 2 (two) times daily. For neuropathy  60 capsule  3  . glyBURIDE (DIABETA) 5 MG tablet Take 1 tablet (5 mg total) by mouth 2 (two) times daily with a meal.  60 tablet  0  . metFORMIN (GLUCOPHAGE) 500 MG tablet Take 1 tablet (500 mg total) by mouth 2 (two) times daily with a meal.  60 tablet  1  . DISCONTD: ciprofloxacin (CIPRO) 500 MG tablet Take 1 tablet (500 mg total) by mouth 2 (two) times daily. Start date 04/12/12; duration unknown  28 tablet  0  . HYDROcodone-acetaminophen (VICODIN) 5-500 MG per tablet Take 1 tablet by mouth every 6 (six) hours as needed. For pain      . DISCONTD: HYDROcodone-acetaminophen  (VICODIN) 5-500 MG per tablet Take 1 tablet by mouth every 6 (six) hours as needed for pain. For pain  20 tablet  0    Results for orders placed during the hospital encounter of 08/02/12 (from the past 48 hour(s))  GLUCOSE, CAPILLARY     Status: Abnormal   Collection Time   08/02/12 10:44 AM      Component Value Range Comment   Glucose-Capillary 292 (*) 70 - 99 mg/dL   APTT     Status: Normal   Collection Time   08/02/12 10:50 AM      Component Value Range Comment   aPTT 26  24 - 37 seconds   CBC     Status: Abnormal   Collection Time   08/02/12 10:50 AM      Component Value Range Comment   WBC 15.7 (*) 4.0 - 10.5 K/uL    RBC 5.62  4.22 - 5.81 MIL/uL    Hemoglobin 16.6  13.0 - 17.0 g/dL    HCT 04.5  40.9 - 81.1 %    MCV 83.1  78.0 - 100.0 fL    MCH 29.5  26.0 - 34.0 pg    MCHC 35.5  30.0 - 36.0 g/dL    RDW 91.4  78.2 - 95.6 %    Platelets 224  150 - 400 K/uL   COMPREHENSIVE METABOLIC PANEL     Status: Abnormal  Collection Time   08/02/12 10:50 AM      Component Value Range Comment   Sodium 137  135 - 145 mEq/L    Potassium 3.7  3.5 - 5.1 mEq/L    Chloride 99  96 - 112 mEq/L    CO2 28  19 - 32 mEq/L    Glucose, Bld 334 (*) 70 - 99 mg/dL    BUN 8  6 - 23 mg/dL    Creatinine, Ser 4.09  0.50 - 1.35 mg/dL    Calcium 9.3  8.4 - 81.1 mg/dL    Total Protein 7.6  6.0 - 8.3 g/dL    Albumin 3.3 (*) 3.5 - 5.2 g/dL    AST 20  0 - 37 U/L    ALT 12  0 - 53 U/L    Alkaline Phosphatase 147 (*) 39 - 117 U/L    Total Bilirubin 0.3  0.3 - 1.2 mg/dL    GFR calc non Af Amer >90  >90 mL/min    GFR calc Af Amer >90  >90 mL/min   PROTIME-INR     Status: Normal   Collection Time   08/02/12 10:50 AM      Component Value Range Comment   Prothrombin Time 12.8  11.6 - 15.2 seconds    INR 0.94  0.00 - 1.49    Dg Chest 2 View  08/02/2012  *RADIOLOGY REPORT*  Clinical Data: Irrigation and debridement of the right foot, diabetes, smoking history  CHEST - 2 VIEW  Comparison: Portable chest x-ray of  04/12/2012  Findings: The lungs are clear.  Mediastinal contours appear normal. The heart is within normal limits in size.  No bony abnormality is seen.  IMPRESSION: No active lung disease.  Original Report Authenticated By: Juline Patch, M.D.    Review of Systems  All other systems reviewed and are negative.    Blood pressure 118/84, pulse 85, temperature 98.1 F (36.7 C), temperature source Oral, resp. rate 18, SpO2 98.00%. Physical Exam  On examination patient's surgical wound and has good beefy granulation tissue. He has necrotic tendinous tissue proximal to the wound with abscess proximal to the surgical incision. Assessment/Plan Assessment: Abscess with necrotic tissue right dorsal foot.   Plan: Will plan for excision of the necrotic tissue irrigation debridement placement of antibiotic beads. Risks and benefits were discussed including infection neurovascular injury need for additional surgery. Patient states he understands and wished to proceed at this time.  Aron Inge V 08/02/2012, 12:22 PM

## 2012-08-02 NOTE — Transfer of Care (Signed)
Immediate Anesthesia Transfer of Care Note  Patient: Derrick [name]  Procedure(s) Performed: Procedure(s) (LRB): IRRIGATION AND DEBRIDEMENT EXTREMITY (Right)  Patient Location: PACU  Anesthesia Type: General  Level of Consciousness: awake, alert  and oriented  Airway & Oxygen Therapy: Patient Spontanous Breathing and Patient connected to nasal cannula oxygen  Post-op Assessment: Report given to PACU RN and Post -op Vital signs reviewed and stable  Post vital signs: Reviewed and stable  Complications: No apparent anesthesia complications

## 2012-08-04 ENCOUNTER — Encounter (HOSPITAL_BASED_OUTPATIENT_CLINIC_OR_DEPARTMENT_OTHER): Payer: Self-pay | Admitting: *Deleted

## 2012-08-04 DIAGNOSIS — E119 Type 2 diabetes mellitus without complications: Secondary | ICD-10-CM | POA: Insufficient documentation

## 2012-08-04 DIAGNOSIS — F17200 Nicotine dependence, unspecified, uncomplicated: Secondary | ICD-10-CM | POA: Insufficient documentation

## 2012-08-04 DIAGNOSIS — S98139A Complete traumatic amputation of one unspecified lesser toe, initial encounter: Secondary | ICD-10-CM | POA: Insufficient documentation

## 2012-08-04 DIAGNOSIS — IMO0002 Reserved for concepts with insufficient information to code with codable children: Secondary | ICD-10-CM | POA: Insufficient documentation

## 2012-08-04 DIAGNOSIS — Y835 Amputation of limb(s) as the cause of abnormal reaction of the patient, or of later complication, without mention of misadventure at the time of the procedure: Secondary | ICD-10-CM | POA: Insufficient documentation

## 2012-08-04 NOTE — ED Notes (Signed)
Had surgery on Friday, but now his foot is "covered in blood". States that he is supposed to keep the wrapping on for 2 weeks. Patient states that no wound care instructions were given except to leave his foot alone for 2 weeks.

## 2012-08-05 ENCOUNTER — Emergency Department (HOSPITAL_BASED_OUTPATIENT_CLINIC_OR_DEPARTMENT_OTHER)
Admission: EM | Admit: 2012-08-05 | Discharge: 2012-08-05 | Disposition: A | Discharge status: Home / Self Care | Payer: Medicaid Other | Attending: Emergency Medicine | Admitting: Emergency Medicine

## 2012-08-05 DIAGNOSIS — R58 Hemorrhage, not elsewhere classified: Secondary | ICD-10-CM

## 2012-08-05 NOTE — ED Notes (Signed)
Patient and his father were not happy with my wound care wrapping of kerlix and 4x4's. The father insisted I used coban, so I applied coban loosely, secured with tape. Patient refused crutches, stated if we insist, he will "take them home and one day may need them for something else". Patient stated he fully intends to not use the crutches. Dr. Shela Commons convinced patient to have crutches.

## 2012-08-05 NOTE — ED Provider Notes (Signed)
History     CSN: [id number]  Arrival date & time 08/04/12  2348   First MD Initiated Contact with Patient 08/05/12 0006      Chief Complaint  Patient presents with  . Wound Check    (Consider location/radiation/quality/duration/timing/severity/associated sxs/prior treatment) HPI Patient reports that his dressing on his right foot was soaked with blood today. He denies other complaint. Has minimal pain at site no other complaint patient is status post right fourth and fifth ray amputation of right foot and had incision and drainage of abscess of right foot performed 08/02/2012. Has minimal pain at incision site on right foot otherwise feels well. No treatment prior to coming here Past Medical History  Diagnosis Date  . Diabetes mellitus   . Prostatitis     Past Surgical History  Procedure Date  . Toe amputation     multiple toes amputated.  . Amputation 05/31/2012    Procedure: AMPUTATION DIGIT;  Surgeon: Nadara Mustard, MD;  Location: [facility] OR;  Service: Orthopedics;  Laterality: Right;  right fourth and fifth toe ray amputation     No family history on file.  History  Substance Use Topics  . Smoking status: Current Everyday Smoker -- 2.0 packs/day for 23 years    Types: Cigarettes  . Smokeless tobacco: Never Used  . Alcohol Use: No      Review of Systems  Constitutional: Negative.   Skin: Positive for wound.       Bleeding from surgical wound right foot  Hematological: Negative.   Psychiatric/Behavioral: Negative.     Allergies  Review of patient's allergies indicates no known allergies.  Home Medications   Current Outpatient Rx  Name Route Sig Dispense Refill  . CIPROFLOXACIN HCL 500 MG PO TABS Oral Take 500 mg by mouth 2 (two) times daily. Start date 04/12/12; duration unknown    . GABAPENTIN 300 MG PO CAPS Oral Take 1 capsule (300 mg total) by mouth 2 (two) times daily. For neuropathy 60 capsule 3  . GLYBURIDE 5 MG PO TABS Oral Take 1 tablet (5 mg total) by  mouth 2 (two) times daily with a meal. 60 tablet 0  . HYDROCODONE-ACETAMINOPHEN 5-500 MG PO TABS Oral Take 1 tablet by mouth every 6 (six) hours as needed. For pain    . METFORMIN HCL 500 MG PO TABS Oral Take 1 tablet (500 mg total) by mouth 2 (two) times daily with a meal. 60 tablet 1    BP 121/90  Pulse 110  Temp 98 F (36.7 C) (Oral)  Resp 16  SpO2 97%  Physical Exam  Nursing note and vitals reviewed. Constitutional: He appears well-developed and well-nourished.  HENT:  Head: Normocephalic and atraumatic.  Right Ear: External ear normal.  Left Ear: External ear normal.  Eyes: EOM are normal.  Neck: Neck supple.  Cardiovascular: Normal rate.   Pulmonary/Chest: Effort normal.  Abdominal: He exhibits no distension.  Musculoskeletal:       The right lower extremity with sutured surgical wound lateral aspect of foot with minimal surrounding redness no tenderness minimal serosanguineous drainage from wound wound clean appearing DP pulse 2+ no red streaks up the leg no tenderness all extremities without redness swelling or tenderness neurovascularly intact    ED Course  Procedures (including critical care time)  Labs Reviewed - No data to display No results found.   No diagnosis found.    MDM  No signs of infection Spoke with Dr. August Saucer Plan dry dressing, crutches, patient be  seen in office to day No weightbearing Diagnosis postoperative bleeding        Doug Sou, MD 08/05/12 0040

## 2012-08-06 ENCOUNTER — Encounter (HOSPITAL_COMMUNITY): Payer: Self-pay | Admitting: Orthopedic Surgery

## 2012-08-12 NOTE — Anesthesia Postprocedure Evaluation (Signed)
  Anesthesia Post-op Note  Patient: Derrick [name]  Procedure(s) Performed: Procedure(s) (LRB): IRRIGATION AND DEBRIDEMENT EXTREMITY (Right)  Patient Location: PACU  Anesthesia Type: General  Level of Consciousness: awake  Airway and Oxygen Therapy: Patient Spontanous Breathing and Patient connected to nasal cannula oxygen  Post-op Pain: none  Post-op Assessment: Post-op Vital signs reviewed  Post-op Vital Signs: Reviewed  Complications: No apparent anesthesia complications

## 2012-09-02 IMAGING — CR DG FOOT COMPLETE 3+V*R*
3 series · 3 of 3 positions shown · non-contrast
Comparison: 04/12/2012

CLINICAL DATA: Foot pain

RIGHT FOOT COMPLETE - 3+ VIEW

[x foot ap right]
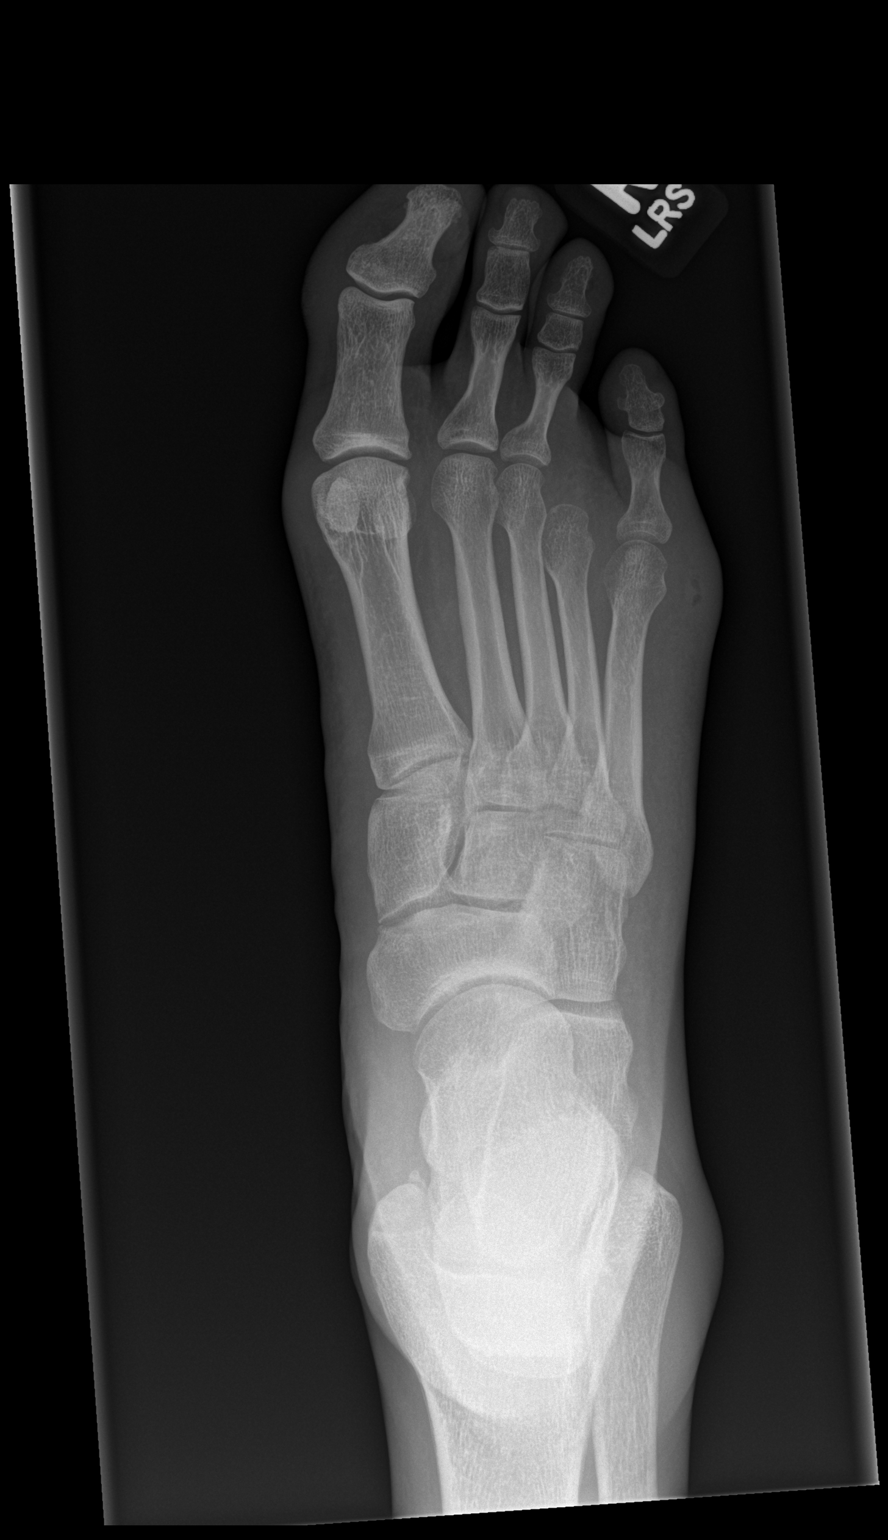

[x foot obl right]
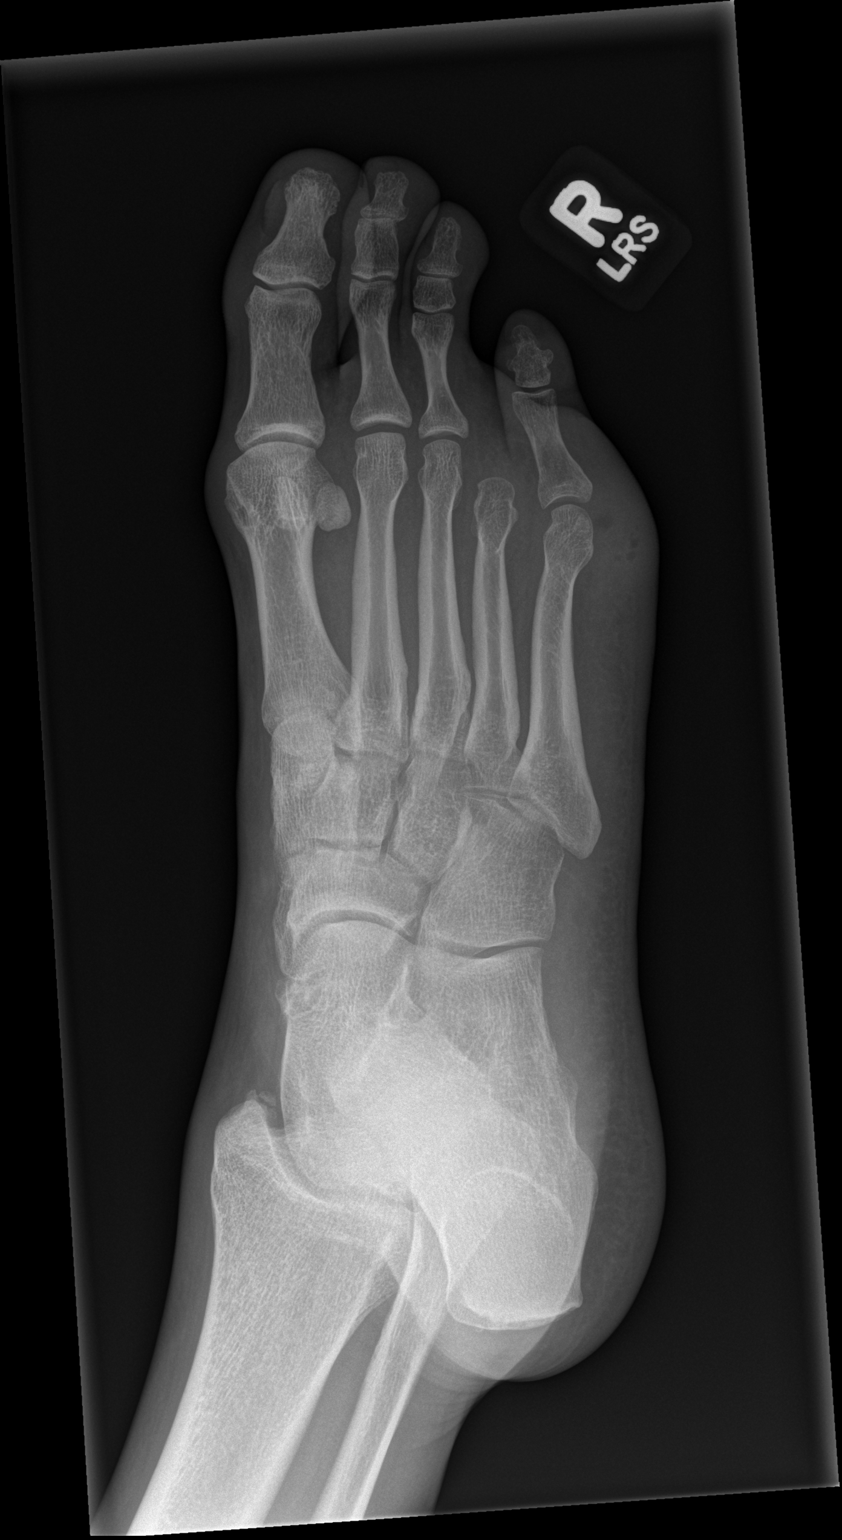

[x foot lat right]
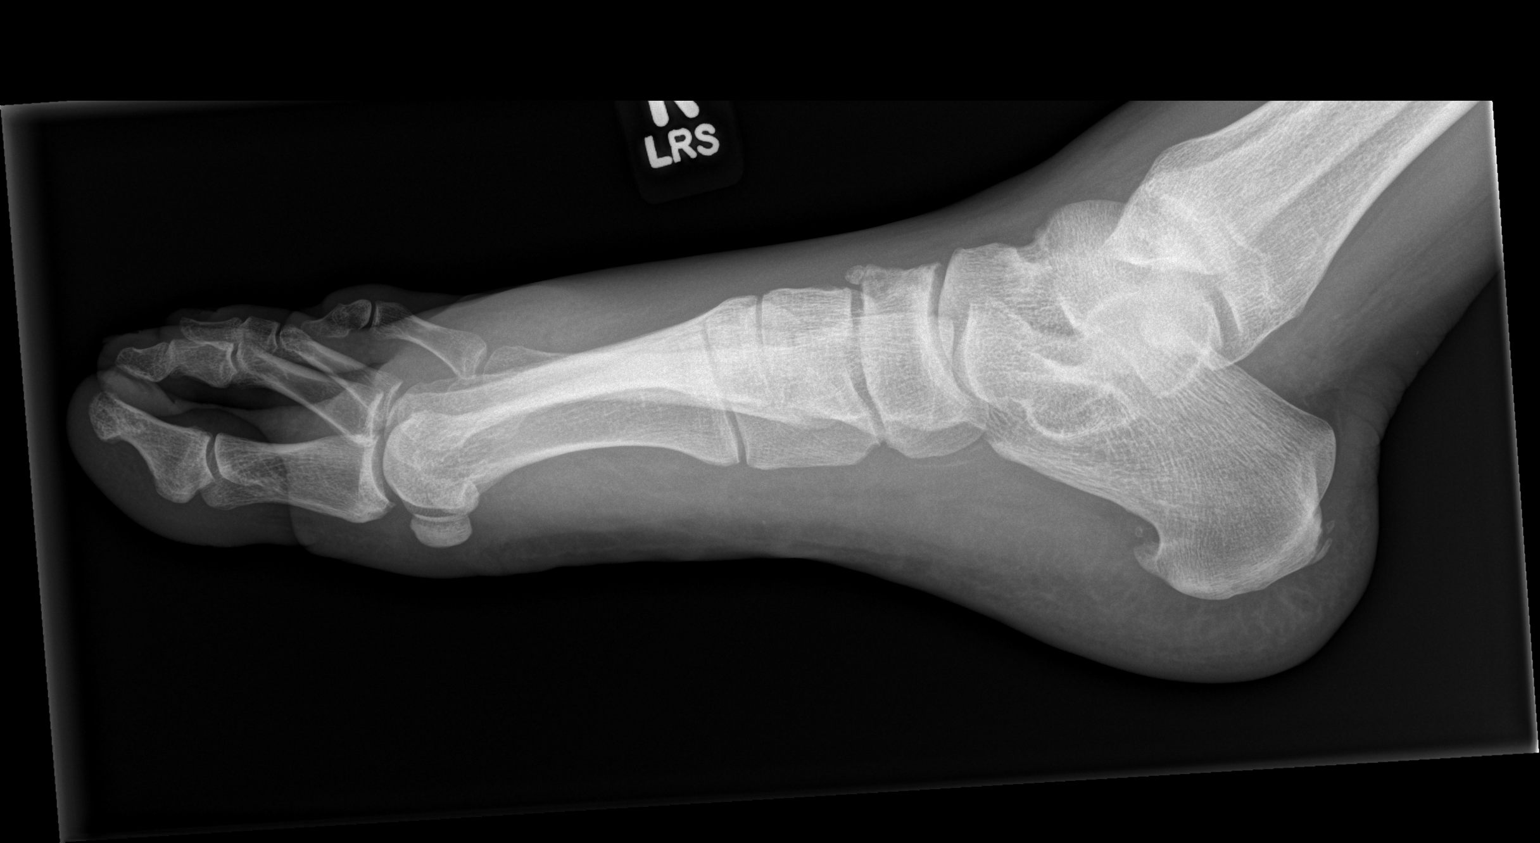

[3 of 3 positions shown; findings below may reference images not displayed]

FINDINGS: Amputation of the fourth digit at the metatarsophalangeal
joint.  No acute fracture or dislocation identified.  Midfoot DJD.
No radiopaque foreign body.  There is soft tissue swelling along
the dorsum and lateral aspect of the foot, with a few locules of
gas laterally.  Posterior and plantar calcaneal enthesopathic
changes.
IMPRESSION: Soft tissue swelling along the dorsum and lateral aspect of the
foot.  There are a few locules of gas.  Correlate clinically for
ulcer/cellulitis.  No underlying acute osseous finding.  If
clinical concern for osteomyelitis persists, MRI follow-up
recommended.

## 2012-09-30 ENCOUNTER — Encounter (HOSPITAL_BASED_OUTPATIENT_CLINIC_OR_DEPARTMENT_OTHER): Payer: Medicaid Other

## 2012-10-28 ENCOUNTER — Encounter (HOSPITAL_BASED_OUTPATIENT_CLINIC_OR_DEPARTMENT_OTHER): Payer: Medicaid Other

## 2012-12-28 DIAGNOSIS — Z87448 Personal history of other diseases of urinary system: Secondary | ICD-10-CM | POA: Insufficient documentation

## 2012-12-28 DIAGNOSIS — E119 Type 2 diabetes mellitus without complications: Secondary | ICD-10-CM | POA: Insufficient documentation

## 2012-12-28 DIAGNOSIS — Z79899 Other long term (current) drug therapy: Secondary | ICD-10-CM | POA: Insufficient documentation

## 2012-12-28 DIAGNOSIS — M25519 Pain in unspecified shoulder: Secondary | ICD-10-CM | POA: Insufficient documentation

## 2012-12-28 DIAGNOSIS — F17200 Nicotine dependence, unspecified, uncomplicated: Secondary | ICD-10-CM | POA: Insufficient documentation

## 2012-12-28 DIAGNOSIS — M6281 Muscle weakness (generalized): Secondary | ICD-10-CM | POA: Insufficient documentation

## 2012-12-28 DIAGNOSIS — R209 Unspecified disturbances of skin sensation: Secondary | ICD-10-CM | POA: Insufficient documentation

## 2012-12-28 NOTE — ED Notes (Signed)
Left arm pain x 1 month. Numbness x 2 days. Denies other s/s.

## 2012-12-29 ENCOUNTER — Emergency Department (HOSPITAL_BASED_OUTPATIENT_CLINIC_OR_DEPARTMENT_OTHER)
Admission: EM | Admit: 2012-12-29 | Discharge: 2012-12-29 | Disposition: A | Discharge status: Home / Self Care | Payer: Self-pay | Attending: Emergency Medicine | Admitting: Emergency Medicine

## 2012-12-29 ENCOUNTER — Emergency Department (HOSPITAL_BASED_OUTPATIENT_CLINIC_OR_DEPARTMENT_OTHER): Payer: Self-pay

## 2012-12-29 ENCOUNTER — Encounter (HOSPITAL_BASED_OUTPATIENT_CLINIC_OR_DEPARTMENT_OTHER): Payer: Self-pay | Admitting: *Deleted

## 2012-12-29 DIAGNOSIS — R29898 Other symptoms and signs involving the musculoskeletal system: Secondary | ICD-10-CM

## 2012-12-29 DIAGNOSIS — M25512 Pain in left shoulder: Secondary | ICD-10-CM

## 2012-12-29 MED ORDER — HYDROCODONE-ACETAMINOPHEN 5-325 MG PO TABS: 1.0000 | ORAL_TABLET | Freq: Four times a day (QID) | ORAL | Status: DC | PRN

## 2012-12-29 MED ORDER — HYDROCODONE-ACETAMINOPHEN 5-325 MG PO TABS
1.0000 | ORAL_TABLET | Freq: Once | ORAL | Status: AC
  Administered 2012-12-29: 1 via ORAL
  Filled 2012-12-29: qty 1

## 2012-12-29 NOTE — ED Notes (Signed)
Patient back from CT.

## 2012-12-29 NOTE — ED Provider Notes (Addendum)
History     CSN: [id number]  Arrival date & time 12/28/12  2353   First MD Initiated Contact with Patient 12/29/12 0105      Chief Complaint  Patient presents with  . Arm Pain    (Consider location/radiation/quality/duration/timing/severity/associated sxs/prior treatment) HPI This is a 42 year old male with a one-month history of pain in his left shoulder. The pain originates at about the location of the subdeltoid bursa radiating down his left upper arm. The pain is moderate to severe, worse with palpation or movement of the left shoulder. There is no change with movement of the neck. There is no neck pain. There is pain on flexion or extension of the left elbow. He is not aware of any injury that may have caused this. He is now complaining of a two-day history of decreased sensation and decreased strength in the left hand. These involve the entire hand and did not follow dermatomal pattern. He states he has dropped objects due to these symptoms. He denies any lower extremity or facial involvement.  Past Medical History  Diagnosis Date  . Diabetes mellitus   . Prostatitis     Past Surgical History  Procedure Date  . Toe amputation     multiple toes amputated.  . Amputation 05/31/2012    Procedure: AMPUTATION DIGIT;  Surgeon: Nadara Mustard, MD;  Location: [facility] OR;  Service: Orthopedics;  Laterality: Right;  right fourth and fifth toe ray amputation   . I&d extremity 08/02/2012    Procedure: IRRIGATION AND DEBRIDEMENT EXTREMITY;  Surgeon: Nadara Mustard, MD;  Location: [facility] OR;  Service: Orthopedics;  Laterality: Right;  Right Foot Irrigation and Debridement, Place Antibiotic Beads    History reviewed. No pertinent family history.  History  Substance Use Topics  . Smoking status: Current Every Day Smoker -- 2.0 packs/day for 23 years    Types: Cigarettes  . Smokeless tobacco: Never Used  . Alcohol Use: No      Review of Systems  All other systems reviewed and are  negative.    Allergies  Review of patient's allergies indicates no known allergies.  Home Medications   Current Outpatient Rx  Name  Route  Sig  Dispense  Refill  . CIPROFLOXACIN HCL 500 MG PO TABS   Oral   Take 500 mg by mouth 2 (two) times daily. Start date 04/12/12; duration unknown         . GABAPENTIN 300 MG PO CAPS   Oral   Take 1 capsule (300 mg total) by mouth 2 (two) times daily. For neuropathy   60 capsule   3   . GLYBURIDE 5 MG PO TABS   Oral   Take 1 tablet (5 mg total) by mouth 2 (two) times daily with a meal.   60 tablet   0   . HYDROCODONE-ACETAMINOPHEN 5-500 MG PO TABS   Oral   Take 1 tablet by mouth every 6 (six) hours as needed. For pain         . METFORMIN HCL 500 MG PO TABS   Oral   Take 1 tablet (500 mg total) by mouth 2 (two) times daily with a meal.   60 tablet   1     BP 123/81  Pulse 104  Temp 98.5 F (36.9 C) (Oral)  Resp 20  Ht 6\' 1"  (1.854 m)  Wt 210 lb (95.255 kg)  BMI 27.71 kg/m2  SpO2 97%  Physical Exam General: Well-developed, well-nourished male in no acute distress;  appearance consistent with age of record HENT: normocephalic, atraumatic Eyes: pupils equal round and reactive to light; extraocular muscles intact Neck: supple; no pain on range of motion Heart: regular rate and rhythm Lungs: clear to auscultation bilaterally Abdomen: soft; nondistended Extremities: No deformity; full range of motion; pulses normal Neurologic: Awake, alert and oriented; motor function intact in all extremities and except for strength +4/5 in the left hand with decreased fine sensation of the left hand; no facial droop; tenderness of the left subdeltoid bursa; pulses and capillary refill normal Skin: Warm and dry Psychiatric: Normal mood and affect    ED Course  Procedures (including critical care time)     MDM  Nursing notes and vitals signs, including pulse oximetry, reviewed.  Summary of this visit's results, reviewed by  myself:   Imaging Studies: Ct Head Wo Contrast  12/29/2012  *RADIOLOGY REPORT*  Clinical Data: Left upper extremity weakness.  CT HEAD WITHOUT CONTRAST  Technique:  Contiguous axial images were obtained from the base of the skull through the vertex without contrast.  Comparison: None.  Findings: No acute intracranial abnormality.  Specifically, no hemorrhage, hydrocephalus, mass lesion, acute infarction, or significant intracranial injury.  No acute calvarial abnormality.  Areas of mucosal thickening in the ethmoid air cells and sphenoid sinuses.  No air fluid levels.  Mastoids are clear.  IMPRESSION: No acute intracranial abnormality.  Chronic sinusitis changes.   Original Report Authenticated By: Charlett Nose, M.D.        Date: 12/29/2012 12:06AM  Rate: 95  Rhythm: normal sinus rhythm  QRS Axis: normal  Intervals: normal  ST/T Wave abnormalities: normal  Conduction Disutrbances: none  Narrative Interpretation: unremarkable  Comparison with previous EKG: none available  1:56 AM The patient's forearm weakness and paresthesias are difficult to correlate with his pain. The pain is located primarily in the shoulder bursa region. There is no CT evidence of stroke which would be evident given the length of patient's symptoms. We will treat the patient's pain and he will followup with Dr. Jean Rosenthal his primary care physician; he will call tomorrow. He was advised the shoulder MRI may be indicated.          Hanley Seamen, MD 12/29/12 0157  Hanley Seamen, MD 12/29/12 [phone]

## 2012-12-29 NOTE — ED Notes (Signed)
Patient ambulatory to CT with quick, steady gait.

## 2013-02-02 ENCOUNTER — Inpatient Hospital Stay (HOSPITAL_BASED_OUTPATIENT_CLINIC_OR_DEPARTMENT_OTHER)
Admission: EM | Admit: 2013-02-02 | Discharge: 2013-02-03 | DRG: 603 | Discharge status: Against Medical Advice | Payer: Medicaid Other | Attending: Internal Medicine | Admitting: Internal Medicine

## 2013-02-02 ENCOUNTER — Encounter (HOSPITAL_BASED_OUTPATIENT_CLINIC_OR_DEPARTMENT_OTHER): Payer: Self-pay | Admitting: *Deleted

## 2013-02-02 DIAGNOSIS — Z792 Long term (current) use of antibiotics: Secondary | ICD-10-CM

## 2013-02-02 DIAGNOSIS — Z79899 Other long term (current) drug therapy: Secondary | ICD-10-CM

## 2013-02-02 DIAGNOSIS — R55 Syncope and collapse: Secondary | ICD-10-CM

## 2013-02-02 DIAGNOSIS — R739 Hyperglycemia, unspecified: Secondary | ICD-10-CM

## 2013-02-02 DIAGNOSIS — S98139A Complete traumatic amputation of one unspecified lesser toe, initial encounter: Secondary | ICD-10-CM

## 2013-02-02 DIAGNOSIS — L03221 Cellulitis of neck: Principal | ICD-10-CM | POA: Diagnosis present

## 2013-02-02 DIAGNOSIS — L0390 Cellulitis, unspecified: Secondary | ICD-10-CM

## 2013-02-02 DIAGNOSIS — R911 Solitary pulmonary nodule: Secondary | ICD-10-CM | POA: Diagnosis present

## 2013-02-02 DIAGNOSIS — IMO0002 Reserved for concepts with insufficient information to code with codable children: Secondary | ICD-10-CM | POA: Diagnosis present

## 2013-02-02 DIAGNOSIS — M86671 Other chronic osteomyelitis, right ankle and foot: Secondary | ICD-10-CM

## 2013-02-02 DIAGNOSIS — E119 Type 2 diabetes mellitus without complications: Secondary | ICD-10-CM | POA: Diagnosis present

## 2013-02-02 DIAGNOSIS — Z720 Tobacco use: Secondary | ICD-10-CM

## 2013-02-02 DIAGNOSIS — L02219 Cutaneous abscess of trunk, unspecified: Secondary | ICD-10-CM | POA: Diagnosis present

## 2013-02-02 DIAGNOSIS — L0211 Cutaneous abscess of neck: Principal | ICD-10-CM | POA: Diagnosis present

## 2013-02-02 DIAGNOSIS — M86679 Other chronic osteomyelitis, unspecified ankle and foot: Secondary | ICD-10-CM

## 2013-02-02 DIAGNOSIS — D72829 Elevated white blood cell count, unspecified: Secondary | ICD-10-CM

## 2013-02-02 DIAGNOSIS — F17200 Nicotine dependence, unspecified, uncomplicated: Secondary | ICD-10-CM | POA: Diagnosis present

## 2013-02-02 LAB — GLUCOSE, CAPILLARY: Glucose-Capillary: 512 mg/dL — ABNORMAL HIGH (ref 70–99)

## 2013-02-02 NOTE — ED Notes (Signed)
Pt states he has had an abscess on his back for about 3 days.

## 2013-02-03 ENCOUNTER — Encounter (HOSPITAL_BASED_OUTPATIENT_CLINIC_OR_DEPARTMENT_OTHER): Payer: Self-pay

## 2013-02-03 ENCOUNTER — Emergency Department (HOSPITAL_BASED_OUTPATIENT_CLINIC_OR_DEPARTMENT_OTHER): Payer: Medicaid Other

## 2013-02-03 DIAGNOSIS — R7309 Other abnormal glucose: Secondary | ICD-10-CM

## 2013-02-03 DIAGNOSIS — L0291 Cutaneous abscess, unspecified: Secondary | ICD-10-CM

## 2013-02-03 DIAGNOSIS — M86679 Other chronic osteomyelitis, unspecified ankle and foot: Secondary | ICD-10-CM

## 2013-02-03 LAB — URINALYSIS, ROUTINE W REFLEX MICROSCOPIC
Bilirubin Urine: NEGATIVE
Ketones, ur: NEGATIVE mg/dL
Nitrite: NEGATIVE
Urobilinogen, UA: 0.2 mg/dL (ref 0.0–1.0)

## 2013-02-03 LAB — CBC WITH DIFFERENTIAL/PLATELET
Basophils Absolute: 0 10*3/uL (ref 0.0–0.1)
Eosinophils Absolute: 0.2 10*3/uL (ref 0.0–0.7)
Lymphs Abs: 3.5 10*3/uL (ref 0.7–4.0)
MCH: 30.8 pg (ref 26.0–34.0)
MCHC: 35.9 g/dL (ref 30.0–36.0)
MCV: 85.7 fL (ref 78.0–100.0)
Monocytes Absolute: 2.3 10*3/uL — ABNORMAL HIGH (ref 0.1–1.0)
Monocytes Relative: 10 % (ref 3–12)
Platelets: 255 10*3/uL (ref 150–400)
RDW: 12.5 % (ref 11.5–15.5)
WBC: 23.1 10*3/uL — ABNORMAL HIGH (ref 4.0–10.5)

## 2013-02-03 LAB — BASIC METABOLIC PANEL
BUN: 11 mg/dL (ref 6–23)
Calcium: 9.9 mg/dL (ref 8.4–10.5)
Creatinine, Ser: 0.6 mg/dL (ref 0.50–1.35)
GFR calc Af Amer: >90 mL/min (ref >90)
GFR calc non Af Amer: >90 mL/min (ref >90)

## 2013-02-03 LAB — URINE MICROSCOPIC-ADD ON

## 2013-02-03 MED ORDER — FENTANYL CITRATE 0.05 MG/ML IJ SOLN
100.0000 ug | Freq: Once | INTRAMUSCULAR | Status: AC
  Administered 2013-02-03: 100 ug via INTRAVENOUS
  Filled 2013-02-03: qty 2

## 2013-02-03 MED ORDER — SODIUM CHLORIDE 0.9 % IV BOLUS (SEPSIS)
1000.0000 mL | Freq: Once | INTRAVENOUS | Status: AC
  Administered 2013-02-03: 1000 mL via INTRAVENOUS

## 2013-02-03 MED ORDER — SODIUM CHLORIDE 0.9 % IV SOLN: INTRAVENOUS | Status: DC

## 2013-02-03 MED ORDER — PIPERACILLIN-TAZOBACTAM IN DEX 2-0.25 GM/50ML IV SOLN: 2.2500 g | Freq: Three times a day (TID) | INTRAVENOUS | Status: DC

## 2013-02-03 MED ORDER — PIPERACILLIN-TAZOBACTAM 3.375 G IVPB
3.3750 g | Freq: Three times a day (TID) | INTRAVENOUS | Status: DC
  Filled 2013-02-03 (×2): qty 50

## 2013-02-03 MED ORDER — GABAPENTIN 300 MG PO CAPS
300.0000 mg | ORAL_CAPSULE | Freq: Two times a day (BID) | ORAL | Status: DC
Start: 2013-02-03 — End: 2013-02-03
  Filled 2013-02-03 (×2): qty 1

## 2013-02-03 MED ORDER — MORPHINE SULFATE 2 MG/ML IJ SOLN: 1.0000 mg | INTRAMUSCULAR | Status: DC | PRN

## 2013-02-03 MED ORDER — IOHEXOL 300 MG/ML  SOLN
100.0000 mL | Freq: Once | INTRAMUSCULAR | Status: AC | PRN
  Administered 2013-02-03: 100 mL via INTRAVENOUS

## 2013-02-03 MED ORDER — HYDROCODONE-ACETAMINOPHEN 5-325 MG PO TABS: 1.0000 | ORAL_TABLET | ORAL | Status: DC | PRN

## 2013-02-03 MED ORDER — ENOXAPARIN SODIUM 30 MG/0.3ML ~~LOC~~ SOLN
30.0000 mg | SUBCUTANEOUS | Status: DC
  Filled 2013-02-03: qty 0.3

## 2013-02-03 MED ORDER — PIPERACILLIN-TAZOBACTAM 3.375 G IVPB
3.3750 g | Freq: Once | INTRAVENOUS | Status: AC
  Administered 2013-02-03: 3.375 g via INTRAVENOUS
  Filled 2013-02-03: qty 50

## 2013-02-03 MED ORDER — VANCOMYCIN HCL IN DEXTROSE 1-5 GM/200ML-% IV SOLN
1000.0000 mg | Freq: Once | INTRAVENOUS | Status: AC
  Administered 2013-02-03: 1000 mg via INTRAVENOUS
  Filled 2013-02-03: qty 200

## 2013-02-03 MED ORDER — INSULIN ASPART 100 UNIT/ML ~~LOC~~ SOLN: 0.0000 [IU] | Freq: Three times a day (TID) | SUBCUTANEOUS | Status: DC

## 2013-02-03 NOTE — ED Notes (Signed)
Transported to CT 

## 2013-02-03 NOTE — ED Notes (Cosign Needed)
I prepared room for possible incision/drainage procedure. Patient seemed agitated during set-up. Dr. Nicanor Alcon entered room to inspect patient's abscess and other sores. Dr. Nicanor Alcon explained to patient she may or may not be able to drain abscess until test results come back from blood draws, possible x-ray, etc. As Dr. Nicanor Alcon examined abscess on patient's back, he flinched in anger at her touching him. Dr. Nicanor Alcon explained she needed to examine him, and she tried to explain plan of care and possible need to transfer him to see surgeon etc. Patient seemed agitated about his previous care in past as reason for being difficult tonight.

## 2013-02-03 NOTE — ED Notes (Signed)
Report given to RN on 5500

## 2013-02-03 NOTE — ED Notes (Signed)
Report given to [facility] on 5500 at [facility]

## 2013-02-03 NOTE — ED Notes (Signed)
Plan of care discussed with pt. Regarding the need for  IV antibiotic administration and fluid bolus. Pt. States that MD has not discussed plan of care with him and he states "she has wasted my time" very upset and cussing in his room. MD immediately in Patient's room to  discuss plan of care with him and that he needed to be admitted for elevated WBCs, Blood sugar, and his abscess needed to be evaluated by a surgeon. Pt still cussing in room  and requested to go outside and smoke a cigarette. Informed pt. That [place] is a smoke free facility. Pt. Requested his IV be removed and that he would drive himself to [facility] and be evaluated there. Instructed pt. that he is not able to drive after receiving IV narcotic pain medications.Security made aware that this patient was not to drive.  IV removed per pt. Request. Did encourage patient at length to stay and be admitted to [facility]. Pt. Did leave room for approx 10 minutes. Pt. Returned to his room and states he wen to smoke outside.  Again at length discussed with pt the need to be admitted. Agreed to be admitted. IV restarted and IV antibiotics started per order.

## 2013-02-03 NOTE — Progress Notes (Signed)
Pt states that the hospital "has done nothing for me, I have been here since 6:00 a.m. And nothing has been done". RN stated to pt that we have IV anabiotics running at the moment and the MD will be rounding on them as soon as they can. Pt told RN if we didn't "do something" for him right now he was going to leave. RN informed pt of right to leave AMA pt stated that is what he wanted to do, and to "get this thing out of my arm" (iv). RN brought papers to pt, pt refused to sign, RN removed iv and pt left.

## 2013-02-03 NOTE — ED Provider Notes (Signed)
History  This chart was scribed for Derrick Robbs Smitty Cords, MD by Derrick [name], ED Scribe. The patient was seen in room MH02/MH02. Patient's care was started at 0046.   CSN: [id number]  Arrival date & time 02/02/13  2333   First MD Initiated Contact with Patient 02/03/13 0046      Chief Complaint  Patient presents with  . Abscess     Patient is a 42 y.o. male presenting with abscess. The history is provided by the patient. No language interpreter was used.  Abscess Location:  Torso Torso abscess location:  Upper back Size:  7 cm Abscess quality: draining, painful and warmth   Duration:  3 days Progression:  Unchanged Chronicity:  New Relieved by:  Nothing Worsened by:  Nothing tried Ineffective treatments:  None tried Associated symptoms: fever and vomiting     HPI Comments: Derrick [name] is a 42 y.o. 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 There is associated fever and vomiting. Patient was diagnosed with DM Type II in 2009 and takes pills at home to manage. Patient has no other symptoms or complaints at this time.   Past Medical History  Diagnosis Date  . Diabetes mellitus   . Prostatitis     Past Surgical History  Procedure Laterality Date  . Toe amputation      multiple toes amputated.  . Amputation  05/31/2012    Procedure: AMPUTATION DIGIT;  Surgeon: Nadara Mustard, MD;  Location[facility] OR;  Service: Orthopedics;  Laterality: Right;  right fourth and fifth toe ray amputation   . I&d extremity  08/02/2012    Procedure: IRRIGATION AND DEBRIDEMENT EXTREMITY;  Surgeon: Nadara Mustard, MD;  Location: [facility] OR;  Service: Orthopedics;  Laterality: Right;  Right Foot Irrigation and Debridement, Place Antibiotic Beads    History reviewed. No pertinent family history.  History  Substance Use Topics  . Smoking status: Current  Every Day Smoker -- 2.00 packs/day for 23 years    Types: Cigarettes  . Smokeless tobacco: Never Used  . Alcohol Use: No      Review of Systems  Constitutional: Positive for fever.  Gastrointestinal: Positive for vomiting.  All other systems reviewed and are negative.    Allergies  Review of patient's allergies indicates no known allergies.  Home Medications   Current Outpatient Rx  Name  Route  Sig  Dispense  Refill  . ciprofloxacin (CIPRO) 500 MG tablet   Oral   Take 500 mg by mouth 2 (two) times daily. Start date 04/12/12; duration unknown         . gabapentin (NEURONTIN) 300 MG capsule   Oral   Take 1 capsule (300 mg total) by mouth 2 (two) times daily. For neuropathy   60 capsule   3   . glyBURIDE (DIABETA) 5 MG tablet   Oral   Take 1 tablet (5 mg total) by mouth 2 (two) times daily with a meal.   60 tablet   0   . HYDROcodone-acetaminophen (NORCO/VICODIN) 5-325 MG per tablet   Oral   Take 1-2 tablets by mouth every 6 (six) hours as needed for pain.   20 tablet   0   . HYDROcodone-acetaminophen (VICODIN) 5-500 MG per tablet   Oral   Take 1 tablet by mouth every 6 (six) hours as needed. For pain         .  metFORMIN (GLUCOPHAGE) 500 MG tablet   Oral   Take 1 tablet (500 mg total) by mouth 2 (two) times daily with a meal.   60 tablet   1     Triage Vitals: BP 145/95  Pulse 128  Temp(Src) 99.9 F (37.7 C) (Oral)  Resp 20  Ht 6' (1.829 m)  Wt 210 lb (95.255 kg)  BMI 28.47 kg/m2  SpO2 96%  Physical Exam  Constitutional: He is oriented to person, place, and time. He appears well-developed and well-nourished. No distress.  HENT:  Head: Normocephalic and atraumatic.  Mouth/Throat: Oropharynx is clear and moist and mucous membranes are normal. Mucous membranes are not dry.  Eyes: Conjunctivae and EOM are normal. Pupils are equal, round, and reactive to light.  Neck: Normal range of motion. Neck supple.  Cardiovascular: Normal rate and regular  rhythm.   Pulmonary/Chest: Effort normal and breath sounds normal. No respiratory distress. He has no wheezes. He has no rales.  Abdominal: Soft. Bowel sounds are normal. There is no tenderness.  Decompressed, scabbed lesion to left suprapubic area.   Neurological: He is alert and oriented to person, place, and time.  Skin: Skin is warm and dry. There is erythema.     7 cm area of warmth, erythema and some external drainage whitish yellowish drainage   Psychiatric: His affect is angry.    ED Course  Procedures (including critical care time) DIAGNOSTIC STUDIES: Oxygen Saturation is 96% on room air, adequate by my interpretation.    COORDINATION OF CARE: 1:04 AM- Patient presents with area of warmth, erythema and drainage to upper back. There is concern over extent of involvement. Will order a CT, UA, CBC with diff and BMP then re-assess. Patient informed that he may need to be admitted due to complexity of symptoms and past history. Was also informed that I may not be able to perform I&D procedure here. Patient was agitated during exam and objected to palpation of affected area.  2:54 AM- Reviewed CT which shows stranding in the soft tissue and WBC is 23.1. Patient informed of plans for admission.     Labs Reviewed  GLUCOSE, CAPILLARY - Abnormal; Notable for the following:    Glucose-Capillary 512 (*)    All other components within normal limits  BASIC METABOLIC PANEL - Abnormal; Notable for the following:    Sodium 133 (*)    Chloride 93 (*)    Glucose, Bld 496 (*)    All other components within normal limits  CBC WITH DIFFERENTIAL  URINALYSIS, ROUTINE W REFLEX MICROSCOPIC    Ct Chest W Contrast  02/03/2013  *RADIOLOGY REPORT*  Clinical Data: Upper back abscess  CT CHEST WITH CONTRAST  Technique:  Multidetector CT imaging of the chest was performed following the standard protocol during bolus administration of intravenous contrast.  Contrast: OMNIPAQUE IOHEXOL 300 MG/ML   SOLN  Comparison: None.  Findings: On image 1, there is left posterior subcutaneous fat stranding, incompletely imaged. This process is centered in the lower neck rather than the back, and therefore not fully imaged on this chest CT.  Normal caliber aorta.  Normal heart size.  Trace pericardial fluid versus thickening.  No pleural effusion.  No intrathoracic lymphadenopathy.  Reactive sized left axillary lymph nodes.  Limited images through the upper abdomen show an incompletely characterized left upper pole renal lesion.  Central airways are patent.  No pneumothorax.  4 mm subpleural nodule left lower lobe on image 49. No confluent airspace opacity.  No  acute osseous finding.  IMPRESSION: Incompletely imaged left posterior neck subcutaneous fat stranding.  Mildly prominent left axillary lymph nodes are likely reactive.  Otherwise, no acute intrathoracic process.  4 mm left lower lobe subpleural nodule. If the patient is at high risk for bronchogenic carcinoma, follow-up chest CT at 1 year is recommended.  If the patient is at low risk, no follow-up is needed.  This recommendation follows the consensus statement: Guidelines for Management of Small Pulmonary Nodules Detected on CT Scans:  A Statement from the Fleischner Society as published in Radiology 2005; 237:395-400.  Incompletely characterized left upper pole renal lesion warrants a non emergent ultrasound follow-up.   Original Report Authenticated By: Jearld Lesch, M.D.      1. Hyperglycemia   2. Cellulitis       MDM  Patient's history and examination performed in the presence of scribe, Aisha, and tech [employer]  EDP immediately told the patient that this was likely an issue that would require admission and IV antibiotics.    310 am Patient reviewed labs with patient and recommended admission with patient's nurse present.  Patient expressed anger that despite pain medication which made patient pain free IV fluids and CT scan and labs nothing had  been done for him during the course of his stay and that he knew he needed to be admitted before he got to the ED.  EDP apologized that he felt he had waited.  EDP stated we were awaiting call back from admitting team   Case d/w Dr. Onalee Hua of [facility] who will accept the patient and Dr. Johna Sheriff, surgeon... Dr. Johna Sheriff is at [facility] Will call Dr. Lindie Spruce at [facility].   400 patient reviewed  CT results post radiology read with [place] patient's nurse present and informed patient of pulmonary nodule and need for outpatient follow up chest CT to assess stability of the lesion within 6 months as the patient is a smoker.  Patient verbalizes understanding of the information.  Updated patient on admission to the hospital.  EDP asked if patient required addition pain medication and had reordered medication.    Patient again asked to see EDP. EDP went to speak with patient again with [place]ann[place] patient's nurse present.  EDP again addressed patient's pain and stated additional medication was ordered for the patient's comfort.  EDP reiterated that it was imperative that patient be admitted for pain medication    There was no point in the patient's care where EDP was in room alone with patient.       I personally performed the services described in this documentation, which was scribed in my presence. The recorded information has been reviewed and is accurate.     Jasmine Awe, MD 02/03/13 [phone]

## 2013-02-03 NOTE — ED Notes (Signed)
Returned from xray

## 2013-02-03 NOTE — ED Notes (Addendum)
MD with pt. States he will proceed with admission

## 2013-02-03 NOTE — ED Notes (Signed)
MD at bedside to discuss CT results.

## 2013-02-03 NOTE — ED Notes (Signed)
Assigned to bed 5511 @ , rn notified.

## 2013-02-03 NOTE — ED Notes (Addendum)
[facility] here and transported pt. To [facility]. Stable at d/c. Dressing placed over abscess.

## 2013-02-03 NOTE — Progress Notes (Addendum)
Derrick [name] [id number] Admitted to 5511: 02/03/2013 6:40 AM Attending Provider: Tarry Kos, MD    Derrick [name] is a 42 y.o. male patient admitted from ED awake, alert  & orientated  X 3,  Prior, VSS - Blood pressure 128/89, pulse 121, temperature 99.4 F (37.4 C), temperature source Oral, resp. rate 20, height 6\' 1"  (1.854 m), weight 95.8 kg (211 lb 3.2 oz), SpO2 95.00%.RA, no c/o shortness of breath, no c/o chest pain, no distress noted.    IV site WDL:  with a transparent dsg that's clean dry and intact.  Allergies:  No Known Allergies   Past Medical History  Diagnosis Date  . Diabetes mellitus   . Prostatitis     History:  obtained from patient  Pt orientation to unit, room and routine. Information packet given to patient.  Admission INP armband ID verified with patient, and in place. SR up x 2, fall risk assessment complete with Patient verbalizing understanding of risks associated with falls. Pt verbalizes an understanding of how to use the call bell and to call for help before getting out of bed, however patient refuses to wear gripped socks as well as refusing initial CBG on arrival.  Skin, patient has gauze dressing on left upper shoulder (previous nurse gave report it was an absess wound with yellow drainage).  Patient also has old 4th Lt toe and last 3-5 Rt toes amputated.  Will cont to monitor and assist as needed.  Elisha Ponder, RN 02/03/2013 6:40 AM

## 2013-02-03 NOTE — ED Notes (Signed)
Report given to [facility]. 

## 2013-02-03 NOTE — ED Notes (Signed)
MD with pt  

## 2013-02-03 NOTE — ED Notes (Addendum)
Pt. Requesting to speak with MD. States he is not sure he wants to be admitted. MD aware and will speak with pt.

## 2013-02-06 NOTE — Discharge Summary (Signed)
Physician Discharge Summary  Derrick [name] ZOX:[id number] DOB: [birth year] DOA: 02/02/2013  PCP: Default, Provider, MD  Admit date: 02/02/2013 Discharge date: 02/03/2013  PT LEFT AGAINST MEDICAL ADVICE  Recommendations for Outpatient Follow-up:  1. Pt will need to follow up with PCP in 2-3 weeks post discharge 2. Unable to discuss discharge plan as pt left AMA, verbally abuse to staff members   Discharge Diagnoses: Left neck area cellulitis   Discharge Condition: Stable  Diet recommendation: Heart healthy diet discussed in details   History of present illness:  Pt transferred from [facility] center for further management of neck abscess and warmth, erythema consistent with cellulitis. Pt not willing to provide history on admission o no details available except the report from ED physician.   Hospital Course:  Neck abscess and cellulitis - pt was started on Vancomycin IV but has refused the therapy and left AMA - nursing staff attempted IV line placement for IVF administration but pt has refused all therapy - nursing staff reported verbal abuse on the floor and pt left AMA  Procedures/Studies: Ct Chest W Contrast  02/03/2013  *RADIOLOGY REPORT*  Clinical Data: Upper back abscess  CT CHEST WITH CONTRAST  Technique:  Multidetector CT imaging of the chest was performed following the standard protocol during bolus administration of intravenous contrast.  Contrast: OMNIPAQUE IOHEXOL 300 MG/ML  SOLN  Comparison: None.  Findings: On image 1, there is left posterior subcutaneous fat stranding, incompletely imaged. This process is centered in the lower neck rather than the back, and therefore not fully imaged on this chest CT.  Normal caliber aorta.  Normal heart size.  Trace pericardial fluid versus thickening.  No pleural effusion.  No intrathoracic lymphadenopathy.  Reactive sized left axillary lymph nodes.  Limited images through the upper abdomen show an incompletely characterized left  upper pole renal lesion.  Central airways are patent.  No pneumothorax.  4 mm subpleural nodule left lower lobe on image 49. No confluent airspace opacity.  No acute osseous finding.  IMPRESSION: Incompletely imaged left posterior neck subcutaneous fat stranding.  Mildly prominent left axillary lymph nodes are likely reactive.  Otherwise, no acute intrathoracic process.  4 mm left lower lobe subpleural nodule. If the patient is at high risk for bronchogenic carcinoma, follow-up chest CT at 1 year is recommended.  If the patient is at low risk, no follow-up is needed.  This recommendation follows the consensus statement: Guidelines for Management of Small Pulmonary Nodules Detected on CT Scans:  A Statement from the Fleischner Society as published in Radiology 2005; 237:395-400.  Incompletely characterized left upper pole renal lesion warrants a non emergent ultrasound follow-up.   Original Report Authenticated By: Jearld Lesch, M.D.    Discharge Exam: Filed Vitals:   02/03/13 0613  BP: 128/89  Pulse: 121  Temp: 99.4 F (37.4 C)  Resp: 20   Filed Vitals:   02/02/13 2349 02/03/13 0407 02/03/13 0613  BP: 145/95 145/88 128/89  Pulse: 128 107 121  Temp: 99.9 F (37.7 C) 98.6 F (37 C) 99.4 F (37.4 C)  TempSrc: Oral  Oral  Resp: 20 18 20   Height: 6' (1.829 m)  6\' 1"  (1.854 m)  Weight: 210 lb (95.255 kg)  211 lb 3.2 oz (95.8 kg)  SpO2: 96%  95%    General: Pt is alert, agitated and willing to participate in examination, refusing to cooperate   Discharge Instructions     Medication List    TAKE these medications  ciprofloxacin 500 MG tablet  Commonly known as:  CIPRO  Take 500 mg by mouth 2 (two) times daily. Start date 04/12/12; duration unknown     gabapentin 300 MG capsule  Commonly known as:  NEURONTIN  Take 1 capsule (300 mg total) by mouth 2 (two) times daily. For neuropathy     glyBURIDE 5 MG tablet  Commonly known as:  DIABETA  Take 1 tablet (5 mg total) by  mouth 2 (two) times daily with a meal.     HYDROcodone-acetaminophen 5-500 MG per tablet  Commonly known as:  VICODIN  Take 1 tablet by mouth every 6 (six) hours as needed. For pain     HYDROcodone-acetaminophen 5-325 MG per tablet  Commonly known as:  NORCO/VICODIN  Take 1-2 tablets by mouth every 6 (six) hours as needed for pain.     metFORMIN 500 MG tablet  Commonly known as:  GLUCOPHAGE  Take 1 tablet (500 mg total) by mouth 2 (two) times daily with a meal.          The results of significant diagnostics from this hospitalization (including imaging, microbiology, ancillary and laboratory) are listed below for reference.     Microbiology: No results found for this or any previous visit (from the past 240 hour(s)).   Labs: Basic Metabolic Panel:  Recent Labs Lab 02/03/13 0106  NA 133*  K 3.7  CL 93*  CO2 27  GLUCOSE 496*  BUN 11  CREATININE 0.60  CALCIUM 9.9   Liver Function Tests: No results found for this basename: AST, ALT, ALKPHOS, BILITOT, PROT, ALBUMIN,  in the last 168 hours No results found for this basename: LIPASE, AMYLASE,  in the last 168 hours No results found for this basename: AMMONIA,  in the last 168 hours CBC:  Recent Labs Lab 02/03/13 0106  WBC 23.1*  NEUTROABS 17.1*  HGB 17.0  HCT 47.3  MCV 85.7  PLT 255   Cardiac Enzymes: No results found for this basename: CKTOTAL, CKMB, CKMBINDEX, TROPONINI,  in the last 168 hours BNP: BNP (last 3 results) No results found for this basename: PROBNP,  in the last 8760 hours CBG:  Recent Labs Lab 02/02/13 2355 02/03/13 0414  GLUCAP 512* 311*     SIGNED: Time coordinating discharge: Over 30 minutes  Debbora Presto, MD  [facility] 02/06/2013, 8:47 PM Pager [phone]  If 7PM-7AM, please contact night-coverage [email or web address] Password [email or web address]

## 2013-02-15 NOTE — H&P (Signed)
[facility] History and Physical  Derrick [name] ZOX:[id number] DOB: 08-26-1971 DOA: 02/02/2013  Referring physician: ED physician PCP: Default, Provider, MD   Chief Complaint: neck pain  HPI:  Pt is 42 y.o. 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 Patient was diagnosed with DM Type II in 2009 and takes pills at home to manage. Patient has no other symptoms or complaints at this time.   Assessment and Plan:  Neck abscess - pt was admitted to regular bed and plan was to start broad spectrum ABX Vancomycin, supportive care with IVF, analgesia and antiemetics as needed Diabetes mellitus - pt currently refusing blood work, continuation of home medication regimen HTN - monitor vitals per floor protocol   Code Status: Full  Review of Systems:  Constitutional: Positive for fever, chills and malaise/fatigue. Negative for diaphoresis.  HENT: Negative for hearing loss, ear pain, nosebleeds, congestion, sore throat, neck pain, tinnitus and ear discharge.   Eyes: Negative for blurred vision, double vision, photophobia, pain, discharge and redness.  Respiratory: Negative for cough, hemoptysis, sputum production, shortness of breath, wheezing and stridor.   Cardiovascular: Negative for chest pain, palpitations, orthopnea, claudication and leg swelling.  Gastrointestinal: Negative for nausea, vomiting and abdominal pain. Negative for heartburn, constipation, blood in stool and melena.  Genitourinary: Negative for dysuria, urgency, frequency, hematuria and flank pain.  Musculoskeletal: Negative for myalgias, back pain, joint pain and falls.  Skin: Neck abscess with erythema and tenderness to palpation  Neurological: Negative for tingling, tremors, sensory change, speech change, focal weakness, loss of  consciousness and headaches.  Endo/Heme/Allergies: Negative for environmental allergies and polydipsia. Does not bruise/bleed easily.  Psychiatric/Behavioral: Negative for suicidal ideas. The patient is not nervous/anxious.      Past Medical History  Diagnosis Date  . Diabetes mellitus   . Prostatitis     Past Surgical History  Procedure Laterality Date  . Toe amputation      multiple toes amputated.  . Amputation  05/31/2012    Procedure: AMPUTATION DIGIT;  Surgeon: Nadara Mustard, MD;  Location: [facility] OR;  Service: Orthopedics;  Laterality: Right;  right fourth and fifth toe ray amputation   . I&d extremity  08/02/2012    Procedure: IRRIGATION AND DEBRIDEMENT EXTREMITY;  Surgeon: Nadara Mustard, MD;  Location: [facility] OR;  Service: Orthopedics;  Laterality: Right;  Right Foot Irrigation and Debridement, Place Antibiotic Beads    Social History:  reports that he has been smoking Cigarettes.  He has a 48 pack-year smoking history. He has never used smokeless tobacco. He reports that he does not drink alcohol or use illicit drugs.  No Known Allergies  No family history of cancers or heart disease, family history of diabetes   Prior to Admission medications   Medication Sig Start Date End Date Taking? Authorizing Provider  ciprofloxacin (CIPRO) 500 MG tablet Take 500 mg by mouth 2 (two) times daily. Start date 04/12/12; duration unknown 06/03/12   Clydia Llano, MD  gabapentin (NEURONTIN) 300 MG capsule Take 1 capsule (300 mg total) by mouth 2 (two) times daily. For neuropathy 04/12/12 04/12/13  Ripudeep Jenna Luo, MD  glyBURIDE (DIABETA) 5 MG tablet Take 1 tablet (5 mg total) by mouth 2 (two) times daily with a meal. 02/04/12 02/03/13  Vida Roller, MD  HYDROcodone-acetaminophen (NORCO/VICODIN) 5-325 MG  per tablet Take 1-2 tablets by mouth every 6 (six) hours as needed for pain. 12/29/12   John L Molpus, MD  HYDROcodone-acetaminophen (VICODIN) 5-500 MG per tablet Take 1 tablet by mouth every 6 (six) hours  as needed. For pain 06/03/12   Clydia Llano, MD  metFORMIN (GLUCOPHAGE) 500 MG tablet Take 1 tablet (500 mg total) by mouth 2 (two) times daily with a meal. 02/04/12 02/03/13  Vida Roller, MD    Physical Exam: Filed Vitals:   02/02/13 2349 02/03/13 0407 02/03/13 0613  BP: 145/95 145/88 128/89  Pulse: 128 107 121  Temp: 99.9 F (37.7 C) 98.6 F (37 C) 99.4 F (37.4 C)  TempSrc: Oral  Oral  Resp: 20 18 20   Height: 6' (1.829 m)  6\' 1"  (1.854 m)  Weight: 210 lb (95.255 kg)  211 lb 3.2 oz (95.8 kg)  SpO2: 96%  95%    Physical Exam  Constitutional: Appears well-developed and well-nourished. No distress.  HENT: Normocephalic. External right and left ear normal. Oropharynx is clear and moist.  Eyes: Conjunctivae and EOM are normal. PERRLA, no scleral icterus.  Neck: Normal ROM. Neck supple. No JVD. No tracheal deviation. No thyromegaly.  CVS: Regular rhythm, tachycardic, S1/S2 +, no murmurs, no gallops, no carotid bruit.  Pulmonary: Effort and breath sounds normal, no stridor, rhonchi, wheezes, rales.  Abdominal: Soft. BS +,  no distension, tenderness, rebound or guarding.  Musculoskeletal: Normal range of motion. No edema and no tenderness.  Lymphadenopathy: No lymphadenopathy noted, cervical, inguinal. Neuro: Alert. Normal reflexes, muscle tone coordination. No cranial nerve deficit. Skin: 2 cm diameter neck area abscess with small amount of clear pus draining, no blood, tenderness to palpation, surrounding erythema  Psychiatric: Normal mood and affect. Behavior, judgment, thought content normal.   Labs on Admission: No blood work available as pt as refusing blood work   Engineer, technical sales on Admission: No results found.  EKG: Normal sinus rhythm, no ST/T wave changes  Debbora Presto, MD  [facility] Pager [phone]  If 7PM-7AM, please contact night-coverage [email or web address] Password [facility] 02/15/2013, 10:43 AM

## 2016-07-10 ENCOUNTER — Other Ambulatory Visit (HOSPITAL_COMMUNITY): Payer: Self-pay | Admitting: Family

## 2016-07-11 ENCOUNTER — Encounter (HOSPITAL_COMMUNITY): Payer: Self-pay | Admitting: *Deleted

## 2016-07-11 MED ORDER — CEFAZOLIN SODIUM-DEXTROSE 2-4 GM/100ML-% IV SOLN
2.0000 g | INTRAVENOUS | Status: AC
  Administered 2016-07-12: 2 g via INTRAVENOUS
  Filled 2016-07-11: qty 100

## 2016-07-11 NOTE — Progress Notes (Signed)
Pt denies cardiac history, chest pain or sob. Pt is diabetic. Last A1C in Care Everywhere is 11.6 on 03/05/16. Pt states he has not used any of his diabetic meds for approximately 2 weeks. States that his fasting blood sugar has been running around 160-170. Pt is supposed to be on Lantus 20 units at bedtime and taking Glipizide.  Pt also states that he will be wearing gym shorts tomorrow and will NOT take them off for surgery. I tried to explain to him that is against the policy and he would need to discuss it with the staff in the AM. He stated "that is why I'm telling you this now because I don't want to discuss it tomorrow". I told him that I would document this but I could not tell him that it's ok and yes, he would have to discuss it tomorrow with the OR staff. He voiced understanding.

## 2016-07-12 ENCOUNTER — Ambulatory Visit (HOSPITAL_COMMUNITY): Payer: Medicare Other | Admitting: Anesthesiology

## 2016-07-12 ENCOUNTER — Encounter (HOSPITAL_COMMUNITY)
Admission: RE | Disposition: A | Discharge status: Home / Self Care | Payer: Self-pay | Source: Ambulatory Visit | Attending: Orthopedic Surgery

## 2016-07-12 ENCOUNTER — Ambulatory Visit (HOSPITAL_COMMUNITY)
Admission: RE | Admit: 2016-07-12 | Discharge: 2016-07-12 | Disposition: A | Discharge status: Home / Self Care | Payer: Medicare Other | Source: Ambulatory Visit | Attending: Orthopedic Surgery | Admitting: Orthopedic Surgery

## 2016-07-12 ENCOUNTER — Encounter (HOSPITAL_COMMUNITY): Payer: Self-pay | Admitting: Anesthesiology

## 2016-07-12 DIAGNOSIS — F17210 Nicotine dependence, cigarettes, uncomplicated: Secondary | ICD-10-CM | POA: Diagnosis not present

## 2016-07-12 DIAGNOSIS — E1142 Type 2 diabetes mellitus with diabetic polyneuropathy: Secondary | ICD-10-CM | POA: Insufficient documentation

## 2016-07-12 DIAGNOSIS — Y838 Other surgical procedures as the cause of abnormal reaction of the patient, or of later complication, without mention of misadventure at the time of the procedure: Secondary | ICD-10-CM | POA: Diagnosis not present

## 2016-07-12 DIAGNOSIS — Z794 Long term (current) use of insulin: Secondary | ICD-10-CM | POA: Insufficient documentation

## 2016-07-12 DIAGNOSIS — T8744 Infection of amputation stump, left lower extremity: Secondary | ICD-10-CM | POA: Diagnosis present

## 2016-07-12 DIAGNOSIS — L02419 Cutaneous abscess of limb, unspecified: Secondary | ICD-10-CM

## 2016-07-12 DIAGNOSIS — T8781 Dehiscence of amputation stump: Secondary | ICD-10-CM | POA: Insufficient documentation

## 2016-07-12 HISTORY — PX: STUMP REVISION: SHX6102

## 2016-07-12 HISTORY — DX: Pneumonia, unspecified organism: J18.9

## 2016-07-12 HISTORY — DX: Polyneuropathy, unspecified: G62.9

## 2016-07-12 LAB — CBC
HEMATOCRIT: 41.2 % (ref 39.0–52.0)
HEMOGLOBIN: 14.1 g/dL (ref 13.0–17.0)
MCH: 28.3 pg (ref 26.0–34.0)
MCHC: 34.2 g/dL (ref 30.0–36.0)
MCV: 82.7 fL (ref 78.0–100.0)
Platelets: 362 10*3/uL (ref 150–400)
RBC: 4.98 MIL/uL (ref 4.22–5.81)
RDW: 13.4 % (ref 11.5–15.5)
WBC: 17.3 10*3/uL — ABNORMAL HIGH (ref 4.0–10.5)

## 2016-07-12 LAB — BASIC METABOLIC PANEL
ANION GAP: 7 (ref 5–15)
BUN: 8 mg/dL (ref 6–20)
CO2: 29 mmol/L (ref 22–32)
Calcium: 8.9 mg/dL (ref 8.9–10.3)
Chloride: 99 mmol/L — ABNORMAL LOW (ref 101–111)
Creatinine, Ser: 0.76 mg/dL (ref 0.61–1.24)
GFR calc Af Amer: >60 mL/min (ref >60)
GFR calc non Af Amer: >60 mL/min (ref >60)
GLUCOSE: 333 mg/dL — AB (ref 65–99)
POTASSIUM: 3.3 mmol/L — AB (ref 3.5–5.1)
Sodium: 135 mmol/L (ref 135–145)

## 2016-07-12 LAB — SURGICAL PCR SCREEN
MRSA, PCR: POSITIVE — AB
Staphylococcus aureus: POSITIVE — AB

## 2016-07-12 LAB — GLUCOSE, CAPILLARY: GLUCOSE-CAPILLARY: 319 mg/dL — AB (ref 65–99)

## 2016-07-12 SURGERY — REVISION, AMPUTATION SITE
Anesthesia: General | Laterality: Left

## 2016-07-12 MED ORDER — PROPOFOL 10 MG/ML IV BOLUS
INTRAVENOUS | Status: DC | PRN
  Administered 2016-07-12: 200 mg via INTRAVENOUS
  Administered 2016-07-12: 50 mg via INTRAVENOUS

## 2016-07-12 MED ORDER — FENTANYL CITRATE (PF) 250 MCG/5ML IJ SOLN
INTRAMUSCULAR | Status: AC
  Filled 2016-07-12: qty 5

## 2016-07-12 MED ORDER — ONDANSETRON HCL 4 MG/2ML IJ SOLN
INTRAMUSCULAR | Status: AC
  Filled 2016-07-12: qty 2

## 2016-07-12 MED ORDER — MIDAZOLAM HCL 2 MG/2ML IJ SOLN
INTRAMUSCULAR | Status: AC
  Filled 2016-07-12: qty 2

## 2016-07-12 MED ORDER — HYDROMORPHONE HCL 1 MG/ML IJ SOLN
0.2500 mg | INTRAMUSCULAR | Status: DC | PRN
  Administered 2016-07-12 (×2): 0.5 mg via INTRAVENOUS

## 2016-07-12 MED ORDER — GLYCOPYRROLATE 0.2 MG/ML IJ SOLN
INTRAMUSCULAR | Status: DC | PRN
  Administered 2016-07-12: 0.2 mg via INTRAVENOUS

## 2016-07-12 MED ORDER — OXYCODONE-ACETAMINOPHEN 5-325 MG PO TABS
1.0000 | ORAL_TABLET | Freq: Once | ORAL | Status: AC
  Administered 2016-07-12: 2 via ORAL

## 2016-07-12 MED ORDER — CHLORHEXIDINE GLUCONATE 4 % EX LIQD: 60.0000 mL | Freq: Once | CUTANEOUS | Status: DC

## 2016-07-12 MED ORDER — OXYCODONE-ACETAMINOPHEN 5-325 MG PO TABS
ORAL_TABLET | ORAL | Status: AC
  Filled 2016-07-12: qty 2

## 2016-07-12 MED ORDER — PHENYLEPHRINE HCL 10 MG/ML IJ SOLN
INTRAMUSCULAR | Status: DC | PRN
  Administered 2016-07-12: 120 ug via INTRAVENOUS

## 2016-07-12 MED ORDER — PROPOFOL 10 MG/ML IV BOLUS
INTRAVENOUS | Status: AC
  Filled 2016-07-12: qty 20

## 2016-07-12 MED ORDER — LIDOCAINE 2% (20 MG/ML) 5 ML SYRINGE
INTRAMUSCULAR | Status: AC
  Filled 2016-07-12: qty 5

## 2016-07-12 MED ORDER — OXYCODONE-ACETAMINOPHEN 10-325 MG PO TABS: 1.0000 | ORAL_TABLET | ORAL | 0 refills | Status: DC | PRN

## 2016-07-12 MED ORDER — HYDROMORPHONE HCL 1 MG/ML IJ SOLN
INTRAMUSCULAR | Status: AC
  Administered 2016-07-12: 0.5 mg via INTRAVENOUS
  Filled 2016-07-12: qty 1

## 2016-07-12 MED ORDER — LIDOCAINE HCL (CARDIAC) 20 MG/ML IV SOLN
INTRAVENOUS | Status: DC | PRN
  Administered 2016-07-12: 100 mg via INTRAVENOUS

## 2016-07-12 MED ORDER — 0.9 % SODIUM CHLORIDE (POUR BTL) OPTIME
TOPICAL | Status: DC | PRN
  Administered 2016-07-12: 1000 mL

## 2016-07-12 MED ORDER — ONDANSETRON HCL 4 MG/2ML IJ SOLN
INTRAMUSCULAR | Status: DC | PRN
  Administered 2016-07-12: 4 mg via INTRAVENOUS

## 2016-07-12 MED ORDER — LACTATED RINGERS IV SOLN
INTRAVENOUS | Status: DC | PRN
  Administered 2016-07-12 (×2): via INTRAVENOUS

## 2016-07-12 MED ORDER — GLYCOPYRROLATE 0.2 MG/ML IV SOSY
PREFILLED_SYRINGE | INTRAVENOUS | Status: AC
  Filled 2016-07-12: qty 3

## 2016-07-12 MED ORDER — MUPIROCIN 2 % EX OINT
TOPICAL_OINTMENT | CUTANEOUS | Status: AC
  Filled 2016-07-12: qty 22

## 2016-07-12 MED ORDER — FENTANYL CITRATE (PF) 100 MCG/2ML IJ SOLN
INTRAMUSCULAR | Status: DC | PRN
  Administered 2016-07-12: 150 ug via INTRAVENOUS
  Administered 2016-07-12 (×2): 50 ug via INTRAVENOUS

## 2016-07-12 MED ORDER — PROPOFOL 10 MG/ML IV BOLUS
INTRAVENOUS | Status: AC
  Filled 2016-07-12: qty 40

## 2016-07-12 SURGICAL SUPPLY — 38 items
BLADE SAW RECIP 87.9 MT (BLADE) IMPLANT
BLADE SURG 21 STRL SS (BLADE) ×3 IMPLANT
BNDG COHESIVE 6X5 TAN STRL LF (GAUZE/BANDAGES/DRESSINGS) ×3 IMPLANT
BNDG GAUZE ELAST 4 BULKY (GAUZE/BANDAGES/DRESSINGS) IMPLANT
COVER SURGICAL LIGHT HANDLE (MISCELLANEOUS) ×3 IMPLANT
DRAPE EXTREMITY T 121X128X90 (DRAPE) ×3 IMPLANT
DRAPE INCISE IOBAN 66X45 STRL (DRAPES) IMPLANT
DRAPE PROXIMA HALF (DRAPES) ×3 IMPLANT
DRAPE U-SHAPE 47X51 STRL (DRAPES) ×6 IMPLANT
DRSG ADAPTIC 3X8 NADH LF (GAUZE/BANDAGES/DRESSINGS) IMPLANT
DRSG VAC ATS LRG SENSATRAC (GAUZE/BANDAGES/DRESSINGS) ×2 IMPLANT
DURAPREP 26ML APPLICATOR (WOUND CARE) ×3 IMPLANT
ELECT REM PT RETURN 9FT ADLT (ELECTROSURGICAL) ×3
ELECTRODE REM PT RTRN 9FT ADLT (ELECTROSURGICAL) ×1 IMPLANT
GAUZE SPONGE 4X4 12PLY STRL (GAUZE/BANDAGES/DRESSINGS) ×3 IMPLANT
GLOVE BIOGEL PI IND STRL 9 (GLOVE) ×1 IMPLANT
GLOVE BIOGEL PI INDICATOR 9 (GLOVE) ×2
GLOVE SURG ORTHO 9.0 STRL STRW (GLOVE) ×3 IMPLANT
GOWN STRL REUS W/ TWL XL LVL3 (GOWN DISPOSABLE) ×2 IMPLANT
GOWN STRL REUS W/TWL XL LVL3 (GOWN DISPOSABLE) ×6
KIT BASIN OR (CUSTOM PROCEDURE TRAY) ×3 IMPLANT
KIT ROOM TURNOVER OR (KITS) ×3 IMPLANT
MANIFOLD NEPTUNE II (INSTRUMENTS) ×3 IMPLANT
NS IRRIG 1000ML POUR BTL (IV SOLUTION) ×3 IMPLANT
PACK GENERAL/GYN (CUSTOM PROCEDURE TRAY) ×3 IMPLANT
PAD ARMBOARD 7.5X6 YLW CONV (MISCELLANEOUS) ×3 IMPLANT
PAD NEG PRESSURE SENSATRAC (MISCELLANEOUS) IMPLANT
SPONGE LAP 18X18 X RAY DECT (DISPOSABLE) IMPLANT
STAPLER VISISTAT 35W (STAPLE) IMPLANT
STOCKINETTE IMPERVIOUS LG (DRAPES) IMPLANT
SUT ETHILON 2 0 PSLX (SUTURE) ×10 IMPLANT
SUT PDS AB 1 CT  36 (SUTURE)
SUT PDS AB 1 CT 36 (SUTURE) IMPLANT
SUT SILK 2 0 (SUTURE)
SUT SILK 2-0 18XBRD TIE 12 (SUTURE) IMPLANT
SUT VIC AB 1 CTX 36 (SUTURE)
SUT VIC AB 1 CTX36XBRD ANBCTR (SUTURE) IMPLANT
TOWEL OR 17X26 10 PK STRL BLUE (TOWEL DISPOSABLE) ×3 IMPLANT

## 2016-07-12 NOTE — Anesthesia Preprocedure Evaluation (Addendum)
Anesthesia Evaluation  Patient identified by MRN, date of birth, ID band Patient awake    Reviewed: Allergy & Precautions, H&P , NPO status , Patient's Chart, lab work & pertinent test results  Airway Mallampati: II  TM Distance: >3 FB Neck ROM: Full    Dental no notable dental hx. (+) Poor Dentition, Dental Advisory Given   Pulmonary Current Smoker,    Pulmonary exam normal breath sounds clear to auscultation       Cardiovascular negative cardio ROS   Rhythm:Regular Rate:Normal     Neuro/Psych negative neurological ROS  negative psych ROS   GI/Hepatic negative GI ROS, Neg liver ROS,   Endo/Other  diabetes, Well Controlled, Type 1, Insulin Dependent  Renal/GU negative Renal ROS  negative genitourinary   Musculoskeletal   Abdominal   Peds  Hematology negative hematology ROS (+)   Anesthesia Other Findings   Reproductive/Obstetrics negative OB ROS                            Anesthesia Physical Anesthesia Plan  ASA: III  Anesthesia Plan: General   Post-op Pain Management:    Induction: Intravenous  Airway Management Planned: LMA  Additional Equipment:   Intra-op Plan:   Post-operative Plan: Extubation in OR  Informed Consent: I have reviewed the patients History and Physical, chart, labs and discussed the procedure including the risks, benefits and alternatives for the proposed anesthesia with the patient or authorized representative who has indicated his/her understanding and acceptance.   Dental advisory given  Plan Discussed with: CRNA  Anesthesia Plan Comments:         Anesthesia Quick Evaluation

## 2016-07-12 NOTE — Progress Notes (Signed)
CBG 319. Pt says he has not used any insulin to treat blood sugar since March of this year. Confirms that he will remain non compliant.  MD aware. No new orders.

## 2016-07-12 NOTE — H&P (Signed)
Derrick [name] is an 45 y.o. male.   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  Past Medical History:  Diagnosis Date  . Diabetes mellitus   . Peripheral neuropathy (HCC)   . Pneumonia   . Prostatitis     Past Surgical History:  Procedure Laterality Date  . AMPUTATION  05/31/2012   Procedure: AMPUTATION DIGIT;  Surgeon: Nadara Mustard, MD;  Location: [facility] OR;  Service: Orthopedics;  Laterality: Right;  right fourth and fifth toe ray amputation   . I&D EXTREMITY  08/02/2012   Procedure: IRRIGATION AND DEBRIDEMENT EXTREMITY;  Surgeon: Nadara Mustard, MD;  Location: [facility] OR;  Service: Orthopedics;  Laterality: Right;  Right Foot Irrigation and Debridement, Place Antibiotic Beads  . SHOULDER SURGERY Bilateral   . TOE AMPUTATION     multiple toes amputated.    Family History  Problem Relation Age of Onset  . Cancer Mother   . Congestive Heart Failure Father    Social History:  reports that he has been smoking Cigarettes.  He has a 48.00 pack-year smoking history. He has never used smokeless tobacco. He reports that he does not drink alcohol or use drugs.  Allergies:  Allergies  Allergen Reactions  . Vancomycin Other (See Comments)    Acute kidney injury    No prescriptions prior to admission.    No results found for this or any previous visit (from the past 48 hour(s)). No results found.  ROS  There were no vitals taken for this visit. Physical Exam  On examination Derrick [name] has cellulitis and purulent drainage from the left transtibial amputation. Assessment/Plan Assessment: Diabetic insensate neuropathy peripheral vascular disease with purulent abscess of the left transtibial amputation.  Plan: We will plan for revision of the transtibial amputation. Risks and benefits were  discussed including risk of the above-the-knee amputation. Derrick [name] states he understands wishes to proceed with surgery at this time.  Nadara Mustard, MD 07/12/2016, 6:44 AM

## 2016-07-12 NOTE — Op Note (Signed)
07/12/2016  12:21 PM  PATIENT:  Derrick [name]    PRE-OPERATIVE DIAGNOSIS:  Abscess Left Below Knee Amputation  POST-OPERATIVE DIAGNOSIS:  Same  PROCEDURE:  Revision Left Below Knee Amputation  SURGEON:  Nadara Mustard, MD  PHYSICIAN ASSISTANT:None ANESTHESIA:   General  PREOPERATIVE INDICATIONS:  [name] is a  45 y.o. male with a diagnosis of Abscess Left Below Knee Amputation who failed conservative measures and elected for surgical management.    The risks benefits and alternatives were discussed with the patient preoperatively including but not limited to the risks of infection, bleeding, nerve injury, cardiopulmonary complications, the need for revision surgery, among others, and the patient was willing to proceed.  OPERATIVE IMPLANTS: Prevena wound VAC  OPERATIVE FINDINGS: Large deep abscess was excised without communication with the surgical incisions  OPERATIVE PROCEDURE: Patient brought the operating room and underwent a general anesthetic. After adequate levels anesthesia obtained patient's left lower extremity was prepped using DuraPrep draped into a sterile field a timeout was called. A fishmouth incision was made around the end of the residual limb, has the entire area of necrotic tissue. This was carried sharply down to bone and the abscess did not communicate with the surgical incisions. The wound was irrigated with normal saline electrocautery was used for hemostasis. Local tissue rearrangement was used to close the wound. A Prevena wound VAC was applied this had a good suction fit patient was extubated taken to the PACU in stable condition. Follow-up in the office in 1 week

## 2016-07-12 NOTE — Transfer of Care (Signed)
Immediate Anesthesia Transfer of Care Note  Patient: Derrick [name]  Procedure(s) Performed: Procedure(s): Revision Left Below Knee Amputation (Left)  Patient Location: PACU  Anesthesia Type:General  Level of Consciousness: awake, alert  and patient cooperative  Airway & Oxygen Therapy: Patient Spontanous Breathing and Patient connected to face mask oxygen  Post-op Assessment: Report given to RN, Post -op Vital signs reviewed and stable, Patient moving all extremities and Patient able to stick tongue midline  Post vital signs: Reviewed and stable  Last Vitals:  Vitals:   07/12/16 1036  BP: 111/79  Pulse: 88  Resp: 20  Temp: 36.8 C    Last Pain:  Vitals:   07/12/16 1057  TempSrc:   PainSc: 7       Patients Stated Pain Goal: 5 (07/12/16 1057)  Complications: No apparent anesthesia complications

## 2016-07-12 NOTE — Anesthesia Postprocedure Evaluation (Signed)
Anesthesia Post Note  Patient: Derrick [name]  Procedure(s) Performed: Procedure(s) (LRB): Revision Left Below Knee Amputation (Left)  Patient location during evaluation: PACU Anesthesia Type: General Level of consciousness: sedated Pain management: satisfactory to patient Vital Signs Assessment: post-procedure vital signs reviewed and stable Respiratory status: spontaneous breathing Cardiovascular status: stable Anesthetic complications: no    Last Vitals:  Vitals:   07/12/16 1230 07/12/16 1245  BP:    Pulse: 99 96  Resp: 10 15  Temp:      Last Pain:  Vitals:   07/12/16 1245  TempSrc:   PainSc: 6                  [name] EDWARD

## 2016-07-12 NOTE — Anesthesia Procedure Notes (Signed)
Procedure Name: LMA Insertion Date/Time: 07/12/2016 11:52 AM Performed by: Cristela Blue Pre-anesthesia Checklist: Patient identified, Emergency Drugs available, Suction available and Patient being monitored Patient Re-evaluated:Patient Re-evaluated prior to inductionOxygen Delivery Method: Circle system utilized Preoxygenation: Pre-oxygenation with 100% oxygen Intubation Type: IV induction LMA: LMA inserted LMA Size: 4.0 Tube type: Oral Number of attempts: 1 Tube secured with: Tape Dental Injury: Teeth and Oropharynx as per pre-operative assessment

## 2016-07-13 ENCOUNTER — Encounter (HOSPITAL_COMMUNITY): Payer: Self-pay | Admitting: Orthopedic Surgery

## 2016-09-19 ENCOUNTER — Ambulatory Visit (INDEPENDENT_AMBULATORY_CARE_PROVIDER_SITE_OTHER): Payer: Medicare Other | Admitting: Orthopedic Surgery

## 2016-09-19 DIAGNOSIS — Z89512 Acquired absence of left leg below knee: Secondary | ICD-10-CM

## 2016-09-19 DIAGNOSIS — L97411 Non-pressure chronic ulcer of right heel and midfoot limited to breakdown of skin: Secondary | ICD-10-CM

## 2016-09-28 ENCOUNTER — Ambulatory Visit: Payer: Medicare Other | Attending: Orthopedic Surgery | Admitting: Physical Therapy

## 2016-09-28 ENCOUNTER — Encounter: Payer: Self-pay | Admitting: Physical Therapy

## 2016-09-28 DIAGNOSIS — M79661 Pain in right lower leg: Secondary | ICD-10-CM

## 2016-09-28 DIAGNOSIS — Z9181 History of falling: Secondary | ICD-10-CM | POA: Diagnosis present

## 2016-09-28 DIAGNOSIS — R2689 Other abnormalities of gait and mobility: Secondary | ICD-10-CM

## 2016-09-28 DIAGNOSIS — R29898 Other symptoms and signs involving the musculoskeletal system: Secondary | ICD-10-CM | POA: Diagnosis present

## 2016-09-28 DIAGNOSIS — M79662 Pain in left lower leg: Secondary | ICD-10-CM | POA: Diagnosis present

## 2016-09-28 DIAGNOSIS — R2681 Unsteadiness on feet: Secondary | ICD-10-CM | POA: Diagnosis present

## 2016-09-29 NOTE — Therapy (Signed)
[facility] [address] [place], [place], [address] Phone: [phone]   Fax:  [phone]  Physical Therapy Evaluation  Patient Details  Name: LILIANA BRENTLINGER MRN: [id number] Date of Birth: 1971/06/08 Referring Provider: Aldean Baker, MD  Encounter Date: 09/28/2016      PT End of Session - 09/28/16 1730    Visit Number 1   Number of Visits 17   Date for PT Re-Evaluation 11/26/16   Authorization Type Medicare G-Code & progress note   PT Start Time 1615   PT Stop Time 1700   PT Time Calculation (min) 45 min   Equipment Utilized During Treatment Gait belt   Activity Tolerance Patient tolerated treatment well   Behavior During Therapy [facility] for tasks assessed/performed      Past Medical History:  Diagnosis Date  . Diabetes mellitus   . Peripheral neuropathy (HCC)   . Pneumonia   . Prostatitis     Past Surgical History:  Procedure Laterality Date  . AMPUTATION  05/31/2012   Procedure: AMPUTATION DIGIT;  Surgeon: Nadara Mustard, MD;  Location: [facility] OR;  Service: Orthopedics;  Laterality: Right;  right fourth and fifth toe ray amputation   . I&D EXTREMITY  08/02/2012   Procedure: IRRIGATION AND DEBRIDEMENT EXTREMITY;  Surgeon: Nadara Mustard, MD;  Location: [facility] OR;  Service: Orthopedics;  Laterality: Right;  Right Foot Irrigation and Debridement, Place Antibiotic Beads  . SHOULDER SURGERY Bilateral   . STUMP REVISION Left 07/12/2016   Procedure: Revision Left Below Knee Amputation;  Surgeon: Nadara Mustard, MD;  Location: [facility] OR;  Service: Orthopedics;  Laterality: Left;  . TOE AMPUTATION     multiple toes amputated.    There were no vitals filed for this visit.       Subjective Assessment - 09/28/16 1618    Subjective This 45yo male has diabetes with peripheral neuropathy developed bil. Charcot foot deformities. He developed wounds on bil. feet. He had revision of Right partial foot amputation (initially 2 toes in  2013) to Transmetatarsal Amputation on 03/07/2016. He still has wound on plantar surface of right foot with recommendation to minimize pressure on foot. Patient reports he stays on counch a lot during the day. 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 He recieved his first prosthesis on 09/05/2016 and is dependent in care & use. He presents for PT evaluation & prosthetic training.    Patient is accompained by: Family member   Pertinent History DM (reports he does not check blood sugar or know his A1C), peripheral neuropathy, Bil. shoulder surgeries, Bil. 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 Yes   Pain Score 7   In last week, worst 10/10, best 4/10   Pain Location Knee   Pain Orientation Right;Left   Pain Descriptors / Indicators Burning   Pain Type Chronic pain   Pain Onset More than a month ago   Pain Frequency Constant   Aggravating Factors  increasing activity level too rapidly,    Pain Relieving Factors sit to rest, hot water   Effect of Pain on Daily Activities limits how much on feet            [facility] PT Assessment - 09/28/16 1615      Assessment   Medical Diagnosis Left Transtibial Amputation, Right  Transmetatarsal Amputation   Referring Provider Aldean Baker, MD   Onset Date/Surgical Date 09/05/06  Prosthesis delivery   Hand Dominance Right   Prior Therapy None     Precautions   Precautions Fall     Balance Screen   Has the patient fallen in the past 6 months Yes   How many times? 1  passed out   Has the patient had a decrease in activity level because of a fear of falling?  No   Is the patient reluctant to leave their home because of a fear of falling?  No     Home Tourist information centre manager residence   Living Arrangements  Spouse/significant other  lived alone prior, 3 Mastifs (150# +)   Type of Home House   Home Access Ramped entrance  back door steps 3+2 without rails.   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, loading, pushing carts, Walk [place]   Leisure golf, softball, bowl     Observation/Other Assessments   Focus on Therapeutic Outcomes (FOTO)  27.74 Functional Status   Activities of Balance Confidence Scale (ABC Scale)  0%   Fear Avoidance Belief Questionnaire (FABQ)  65 (16)     Posture/Postural Control   Posture/Postural Control Postural limitations   Postural Limitations Rounded Shoulders;Forward head;Flexed trunk;Weight shift right     ROM / Strength   AROM / PROM / Strength AROM;Strength     AROM   Overall AROM  Within functional limits for tasks performed     Strength   Overall Strength Within functional limits for tasks performed     Transfers   Transfers Sit to Stand;Stand to Sit   Sit to Stand 5: Supervision;With upper extremity assist;With armrests;From chair/3-in-1  Requires back of legs against stable chair to stabilize   Stand to Sit 5: Supervision;With upper extremity assist;With armrests;To chair/3-in-1  uses back of legs against chair to control descent     Ambulation/Gait   Ambulation/Gait Yes   Ambulation/Gait Assistance 4: Min assist   Ambulation/Gait Assistance Details balance losses require minA or touch on wall/ furniture to stabilize   Ambulation Distance (Feet) 200 Feet   Assistive device Prosthesis   Gait Pattern Step-through pattern;Decreased arm swing - left;Decreased step length - right;Decreased stance time - left;Decreased stride length;Decreased hip/knee flexion - right;Decreased hip/knee flexion - left;Decreased weight  shift to left;Trunk flexed;Abducted - left;Poor foot clearance - left;Poor foot clearance - right   Ambulation Surface Indoor;Level   Gait velocity 2.05 ft/sec   Stairs Yes   Stairs Assistance 5: Supervision   Stair Management Technique Two rails;Alternating pattern;One rail Left;Step to pattern;Forwards   Number of Stairs 4  2 reps   Ramp 3: Mod assist  prosthesis only   Curb 3: Mod assist  prosthesis only     Standardized Balance Assessment   Standardized Balance Assessment Berg Balance Test     Berg Balance Test   Sit to Stand Needs minimal aid to stand or to stabilize   Standing Unsupported Needs several tries to stand 30 seconds unsupported   Sitting with Back Unsupported but Feet Supported on Floor or Stool Able to sit safely and securely 2 minutes   Stand to Sit Uses backs of legs against chair to control descent  Transfers Able to transfer safely, definite need of hands   Standing Unsupported with Eyes Closed Needs help to keep from falling   Standing Ubsupported with Feet Together Needs help to attain position and unable to hold for 15 seconds   From Standing, Reach Forward with Outstretched Arm Loses balance while trying/requires external support   From Standing Position, Pick up Object from Floor Unable to try/needs assist to keep balance   From Standing Position, Turn to Look Behind Over each Shoulder Needs assist to keep from losing balance and falling   Turn 360 Degrees Needs assistance while turning   Standing Unsupported, Alternately Place Feet on Step/Stool Needs assistance to keep from falling or unable to try   Standing Unsupported, One Foot in [employer] Loses balance while stepping or standing   Standing on One Leg Unable to try or needs assist to prevent fall   Total Score 11     Functional Gait  Assessment   Gait assessed  Yes   Gait Level Surface Cannot walk 20 ft without assistance, severe gait deviations or imbalance deviates greater than 15 in outside of the  12 in walkway width or reaches and touches the wall   Change in Gait Speed Cannot change speeds, deviates greater than 15 in outside 12 in walkway width, or loses balance and has to reach for wall or be caught.   Gait with Horizontal Head Turns Performs head turns with moderate changes in gait velocity, slows down, deviates 10-15 in outside 12 in walkway width but recovers, can continue to walk.   Gait with Vertical Head Turns Performs task with severe disruption of gait (eg, staggers 15 in outside 12 in walkway width, loses balance, stops, reaches for wall).   Gait and Pivot Turn Cannot turn safely, requires assistance to turn and stop.   Step Over Obstacle Cannot perform without assistance.   Gait with Narrow Base of Support Ambulates less than 4 steps heel to toe or cannot perform without assistance.   Gait with Eyes Closed Cannot walk 20 ft without assistance, severe gait deviations or imbalance, deviates greater than 15 in outside 12 in walkway width or will not attempt task.   Ambulating Backwards Cannot walk 20 ft without assistance, severe gait deviations or imbalance, deviates greater than 15 in outside 12 in walkway width or will not attempt task.   Steps Two feet to a stair, must use rail.   Total Score 1         Prosthetics Assessment - 09/28/16 1615      Prosthetics   Prosthetic Care Dependent with Skin check;Residual limb care;Care of non-amputated limb;Prosthetic cleaning;Ply sock cleaning;Correct ply sock adjustment;Proper wear schedule/adjustment;Proper weight-bearing schedule/adjustment   Prosthetic Care Comments  Patient's right Transmetatarsal Ampuation has wound with gauze covering on plantar aspect. PT did not remove dressing as this clinic does not have wound care equipment. PT instructed in recommendation to use RW or crutches for gait for balance & fall prevention and to limit weight bearing on right foot. Pt verbalizes difficulty with room in his house but reports enough  to use Hover round power wheelchair.    Donning prosthesis  Supervision  requires skilled instructions in proper technique   Current prosthetic wear tolerance (days/week)  reports wear 22 of 23 days since delivery   Current prosthetic wear tolerance (#hours/day)  reports wear 1 hr /day 1x/day.   Edema Non-pitting edema in residual limb distal to knee   Residual limb condition  Bulbous shape, good  hair growth, 3 superficial areas on medial scar red with light epidermal coverage that needs close monitoring, normal color, temperature and moisture.    K code/activity level with prosthetic use  K3 full community with variable cadence.                  [facility] Adult PT Treatment/Exercise - 09/28/16 1615      Prosthetics   Education Provided Skin check;Residual limb care;Care of non-amputated limb;Prosthetic cleaning;Ply sock cleaning;Correct ply sock adjustment;Proper Donning;Proper Doffing;Proper wear schedule/adjustment;Proper weight-bearing schedule/adjustment  Initiate wear 2hrs 2x/day with increase q7 days if no issues   Person(s) Educated Patient;Other (comment)  girlfriend, [name]   Education Method Explanation;Demonstration;Tactile cues;Verbal cues   Education Method Verbalized understanding;Returned demonstration;Tactile cues required;Verbal cues required;Needs further instruction                PT Education - 09/28/16 1655    Education provided Yes   Education Details Recommend use of RW in home for safety / balance / fall prevention & to limit weight bearing on RLE   Person(s) Educated Patient   Methods Explanation;Verbal cues   Comprehension Verbalized understanding          PT Short Term Goals - 09/28/16 1730      PT SHORT TERM GOAL #1   Title Patient verbalizes proper cleaning & demonstrates proper donning of prosthesis. (Target Date: 10/27/2016)   Time 4   Period Weeks   Status New     PT SHORT TERM GOAL #2   Title Patient tolerates prosthesis wear  >10hrs/ day without residual limb skin issues or limb pain >4/10. (Target Date: 10/27/2016)   Time 4   Period Weeks   Status New     PT SHORT TERM GOAL #3   Title Patient ambulates 300' with prosthesis & single point cane with superivision. (Target Date: 10/27/2016)   Time 4   Period Weeks   Status New     PT SHORT TERM GOAL #4   Title Patient negotiates ramps & curbs with single point cane & prosthesis with supervision. (Target Date: 10/27/2016)   Time 4   Period Weeks   Status New     PT SHORT TERM GOAL #5   Title Patient picks up objects from floor with supervision. (Target Date: 10/27/2016)   Time 4   Period Weeks   Status New           PT Long Term Goals - 09/28/16 1730      PT LONG TERM GOAL #1   Title Patient tolerates wear of prosthesis >90% of awake hours without skin issues on residual limb or limb pain to enable function throughout his day. (Target Date: 11/24/2016)   Time 8   Period Weeks   Status New     PT LONG TERM GOAL #2   Title Patient verbalizes & demonstrates proper prosthetic care to enable safe prosthesis use (Target Date: 11/24/2016)   Time 8   Period Weeks   Status New     PT LONG TERM GOAL #3   Title Patient ambulates 1000' including grass, ramps, curbs & stairs (1 rail) with prosthesis & LRAD modified independent to enable community mobiity. (Target Date: 11/24/2016)   Time 8   Period Weeks   Status New     PT LONG TERM GOAL #4   Title Berg Balance >36/56 to indicate lower fall risk. (Target Date: 11/24/2016)   Time 8   Period Weeks   Status New  PT LONG TERM GOAL #5   Title Functional Gait Assessment with single point cane & prosthesis >/= 10/30 to indicate lower fall risk. (Target Date: 11/24/2016)   Time 8   Period Weeks   Status New     Additional Long Term Goals   Additional Long Term Goals Yes     PT LONG TERM GOAL #6   Title Patient demonstrates work & recreational activities using modified techniques safely. (Target Date:  11/24/2016)   Time 8   Period Weeks   Status New               Plan - 24-Oct-2016 1730    Clinical Impression Statement This 45yo male has left Transtibial Amputation & right Transmetatarsal Amputation with wound on plantar surface of foot. He recieved his first prosthesis on 09/05/2016 with wear 22 of 23 days since delivery for only 1 hr/ day which limits function during his day. Patient is unknowledgeable in safe prosthesis use & care. He is also unknowledgeable in RLE care. Patient has high fall risk as noted by [facility] 11/56. His gait is dependent. He is ambulating without assistive device except prosthesis but requires minA or touch on furniture for balance. Functional Gait Assessment of 1/30 with balance losses to turn head to scan environment, negotiate over or around obstacles and high level gait. Patient has pain in lower extremities with increased activity. He reports difficulty with work & recreational activities. Patient's condition is evolving and plan of care is moderate.    Rehab Potential Good   PT Frequency 2x / week   PT Duration 8 weeks   PT Treatment/Interventions ADLs/Self Care Home Management;DME Instruction;Gait training;Stair training;Functional mobility training;Therapeutic activities;Therapeutic exercise;Balance training;Neuromuscular re-education;Patient/family education;Prosthetic Training;Orthotic Fit/Training   PT Next Visit Plan Review prosthetic care, HEP in mid-line, prosthetic gait with RW or crutches   Consulted and Agree with Plan of Care Patient      Patient will benefit from skilled therapeutic intervention in order to improve the following deficits and impairments:  Abnormal gait, Decreased activity tolerance, Decreased balance, Decreased endurance, Decreased knowledge of use of DME, Decreased mobility, Postural dysfunction, Prosthetic Dependency, Pain  Visit Diagnosis: Other abnormalities of gait and mobility  Unsteadiness on feet  History of  falling  Other symptoms and signs involving the musculoskeletal system  Pain in left lower leg  Pain in right lower leg      G-Codes - 10-24-2016 1730    Functional Assessment Tool Used Patient wore prosthesis 22 of 23 days since delivery for 1 hr per day. Patient is dependent in prosthetic care.    Functional Limitation Self care   Self Care Goal Status [phone]) At least 80 percent but less than 100 percent impaired, limited or restricted   Self Care Discharge Status [phone]) At least 1 percent but less than 20 percent impaired, limited or restricted       Problem List Patient Active Problem List   Diagnosis Date Noted  . Chronic osteomyelitis involving ankle and foot (HCC) 06/03/2012  . Syncope and collapse 04/12/2012  . Tobacco abuse 04/12/2012  . Cellulitis 02/25/2012  . Diabetic toe ulcer (HCC) 02/25/2012  . Diabetes mellitus (HCC) 02/25/2012  . Leukocytosis 02/25/2012    Zhoe Catania PT, DPT 09/29/2016, 3:17 PM  [facility] [address] [place], [place], [address] Phone: [phone]   Fax:  [phone]  Name: RONON FERGER MRN: [id number] Date of Birth: December 22, 1970

## 2016-10-02 ENCOUNTER — Ambulatory Visit: Payer: Medicare Other | Admitting: Physical Therapy

## 2016-10-03 ENCOUNTER — Inpatient Hospital Stay (INDEPENDENT_AMBULATORY_CARE_PROVIDER_SITE_OTHER): Payer: Medicare Other | Admitting: Orthopedic Surgery

## 2016-10-03 DIAGNOSIS — E1142 Type 2 diabetes mellitus with diabetic polyneuropathy: Secondary | ICD-10-CM

## 2016-10-03 DIAGNOSIS — L97224 Non-pressure chronic ulcer of left calf with necrosis of bone: Secondary | ICD-10-CM

## 2016-10-12 ENCOUNTER — Ambulatory Visit: Payer: Medicare Other | Admitting: Physical Therapy

## 2016-10-17 ENCOUNTER — Ambulatory Visit: Payer: Medicare Other | Admitting: Physical Therapy

## 2016-10-17 ENCOUNTER — Encounter: Payer: Self-pay | Admitting: Physical Therapy

## 2016-10-17 DIAGNOSIS — R2689 Other abnormalities of gait and mobility: Secondary | ICD-10-CM | POA: Diagnosis not present

## 2016-10-17 DIAGNOSIS — Z9181 History of falling: Secondary | ICD-10-CM

## 2016-10-17 DIAGNOSIS — R29898 Other symptoms and signs involving the musculoskeletal system: Secondary | ICD-10-CM

## 2016-10-17 DIAGNOSIS — R2681 Unsteadiness on feet: Secondary | ICD-10-CM

## 2016-10-18 NOTE — Therapy (Signed)
[facility] [address] [place], [place], [address] Phone: [phone]   Fax:  [phone]  Physical Therapy Treatment  Patient Details  Name: Derrick [name] MRN: [id number] Date of Birth: 03/18/1971 Referring Provider: Aldean BakerMarcus Duda, MD  Encounter Date: 10/17/2016      PT End of Session - 10/17/16 1542    Visit Number 2   Number of Visits 17   Date for PT Re-Evaluation 11/26/16   Authorization Type Medicare G-Code & progress note   PT Start Time 1535   PT Stop Time 1615   PT Time Calculation (min) 40 min   Equipment Utilized During Treatment Gait belt   Activity Tolerance Patient tolerated treatment well   Behavior During Therapy [facility] for tasks assessed/performed      Past Medical History:  Diagnosis Date  . Diabetes mellitus   . Peripheral neuropathy (HCC)   . Pneumonia   . Prostatitis     Past Surgical History:  Procedure Laterality Date  . AMPUTATION  05/31/2012   Procedure: AMPUTATION DIGIT;  Surgeon: Nadara MustardMarcus V Duda, MD;  [facility];  Service: Orthopedics;  Laterality: Right;  right fourth and fifth toe ray amputation   . I&D EXTREMITY  08/02/2012   Procedure: IRRIGATION AND DEBRIDEMENT EXTREMITY;  Surgeon: Nadara MustardMarcus V Duda, MD;  Location: [facility] OR;  Service: Orthopedics;  Laterality: Right;  Right Foot Irrigation and Debridement, Place Antibiotic Beads  . SHOULDER SURGERY Bilateral   . STUMP REVISION Left 07/12/2016   Procedure: Revision Left Below Knee Amputation;  Surgeon: Nadara MustardMarcus V Duda, MD;  Location: [facility] OR;  Service: Orthopedics;  Laterality: Left;  . TOE AMPUTATION     multiple toes amputated.    There were no vitals filed for this visit.      Subjective Assessment - 10/17/16 1536    Subjective No new complaints. No new falls to report. To clinic with prosthesis only, no AD being used. Walked in with family support.    Patient is accompained by: Family member   Pertinent History DM (reports he  does not check blood sugar or know his A1C), peripheral neuropathy, Bil. shoulder surgeries, Bil. 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 Yes   Pain Score 8    Pain Location Knee   Pain Orientation Right   Pain Descriptors / Indicators Burning   Pain Type Chronic pain   Pain Onset More than a month ago   Pain Frequency Constant   Aggravating Factors  increased activity   Multiple Pain Sites Yes   Pain Score 7   Pain Location Leg   Pain Orientation Left   Pain Descriptors / Indicators Burning   Pain Type Phantom pain   Pain Frequency Constant             [facility] Adult PT Treatment/Exercise - 10/17/16 1543      Transfers   Transfers Sit to Stand;Stand to Sit   Sit to Stand 5: Supervision;With upper extremity assist;With armrests;From chair/3-in-1   Sit to Stand Details Verbal cues for technique;Verbal cues for precautions/safety;Verbal cues for safe use of DME/AE   Stand to Sit 5: Supervision;With upper extremity assist;With armrests;To chair/3-in-1   Stand to Sit Details (indicate cue type and reason) Verbal cues for safe use of DME/AE;Verbal cues for precautions/safety;Verbal cues for technique  Ambulation/Gait   Ambulation/Gait Yes   Ambulation/Gait Assistance 4: Min guard;5: Supervision  progressed to supervision with single axillary crutch   Ambulation/Gait Assistance Details trialed bil axillary crutches, followed by single axillary crutch with gait. Pt with improved sequencing and balance with single crutch.                                      Ambulation Distance (Feet) 120 Feet  x1 with both axillary crutches, 80 x1 with single crutch   Assistive device Prosthesis;Crutches   Gait Pattern Step-through pattern;Decreased arm swing - left;Decreased step length -  right;Decreased stance time - left;Decreased stride length;Decreased hip/knee flexion - right;Decreased hip/knee flexion - left;Decreased weight shift to left;Trunk flexed;Abducted - left;Poor foot clearance - left;Poor foot clearance - right   Ambulation Surface Level;Indoor     Prosthetics   Prosthetic Care Comments  provided education on importance of consistent wear times each day for skin integrety, for sweat management due to new blister on posterior thigh, and on use of an AD to off load the LEs for decreased pain with mobility.                          Current prosthetic wear tolerance (days/week)  daily for most part   Current prosthetic wear tolerance (#hours/day)  1-2 hours   Residual limb condition  intact with small blister area on posterior thigh at top of liner   Education Provided Residual limb care;Skin check;Correct ply sock adjustment;Proper Donning;Proper Doffing;Proper wear schedule/adjustment;Proper weight-bearing schedule/adjustment   Person(s) Educated Patient;Other (comment)  girlfriend, [name]   Education Method Explanation;Demonstration;Verbal cues   Education Method Verbalized understanding;Verbal cues required;Needs further instruction;Tactile cues required   Donning Prosthesis Supervision   Doffing Prosthesis Supervision             PT Short Term Goals - 09/28/16 1730      PT SHORT TERM GOAL #1   Title Patient verbalizes proper cleaning & demonstrates proper donning of prosthesis. (Target Date: 10/27/2016)   Time 4   Period Weeks   Status New     PT SHORT TERM GOAL #2   Title Patient tolerates prosthesis wear >10hrs/ day without residual limb skin issues or limb pain >4/10. (Target Date: 10/27/2016)   Time 4   Period Weeks   Status New     PT SHORT TERM GOAL #3   Title Patient ambulates 300' with prosthesis & single point cane with superivision. (Target Date: 10/27/2016)   Time 4   Period Weeks   Status New     PT SHORT TERM GOAL #4   Title  Patient negotiates ramps & curbs with single point cane & prosthesis with supervision. (Target Date: 10/27/2016)   Time 4   Period Weeks   Status New     PT SHORT TERM GOAL #5   Title Patient picks up objects from floor with supervision. (Target Date: 10/27/2016)   Time 4   Period Weeks   Status New           PT Long Term Goals - 09/28/16 1730      PT LONG TERM GOAL #1   Title Patient tolerates wear of prosthesis >90% of awake hours without skin issues on residual limb or limb pain to enable function throughout his day. (Target Date: 11/24/2016)   Time 8   Period Weeks  Status New     PT LONG TERM GOAL #2   Title Patient verbalizes & demonstrates proper prosthetic care to enable safe prosthesis use (Target Date: 11/24/2016)   Time 8   Period Weeks   Status New     PT LONG TERM GOAL #3   Title Patient ambulates 1000' including grass, ramps, curbs & stairs (1 rail) with prosthesis & LRAD modified independent to enable community mobiity. (Target Date: 11/24/2016)   Time 8   Period Weeks   Status New     PT LONG TERM GOAL #4   Title Berg Balance >36/56 to indicate lower fall risk. (Target Date: 11/24/2016)   Time 8   Period Weeks   Status New     PT LONG TERM GOAL #5   Title Functional Gait Assessment with single point cane & prosthesis >/= 10/30 to indicate lower fall risk. (Target Date: 11/24/2016)   Time 8   Period Weeks   Status New     Additional Long Term Goals   Additional Long Term Goals Yes     PT LONG TERM GOAL #6   Title Patient demonstrates work & recreational activities using modified techniques safely. (Target Date: 11/24/2016)   Time 8   Period Weeks   Status New           Plan - 10/17/16 1542    Clinical Impression Statement Today's skilled session continued to address prosthetic management and mobility with prosthesis. Reinforced importance of consistensy with wear times and use of AD at this time to offload LE"s for decreased pain with mobility.  Pt/spouse verbalized understanding. Pt agreeable to use of singe axillary crutch at this time with gait/mobility. Pt is making slow, steady progress toward goals and should benefit from continued PT to progress toward unmet goals.    Rehab Potential Good   PT Frequency 2x / week   PT Duration 8 weeks   PT Treatment/Interventions ADLs/Self Care Home Management;DME Instruction;Gait training;Stair training;Functional mobility training;Therapeutic activities;Therapeutic exercise;Balance training;Neuromuscular re-education;Patient/family education;Prosthetic Training;Orthotic Fit/Training   PT Next Visit Plan Review prosthetic care, HEP in mid-line, prosthetic gait with RW or crutches   Consulted and Agree with Plan of Care Patient      Patient will benefit from skilled therapeutic intervention in order to improve the following deficits and impairments:  Abnormal gait, Decreased activity tolerance, Decreased balance, Decreased endurance, Decreased knowledge of use of DME, Decreased mobility, Postural dysfunction, Prosthetic Dependency, Pain  Visit Diagnosis: Other abnormalities of gait and mobility  Unsteadiness on feet  History of falling  Other symptoms and signs involving the musculoskeletal system     Problem List Patient Active Problem List   Diagnosis Date Noted  . Chronic osteomyelitis involving ankle and foot (HCC) 06/03/2012  . Syncope and collapse 04/12/2012  . Tobacco abuse 04/12/2012  . Cellulitis 02/25/2012  . Diabetic toe ulcer (HCC) 02/25/2012  . Diabetes mellitus (HCC) 02/25/2012  . Leukocytosis 02/25/2012    Sallyanne KusterKathy Kevyn Boquet, PTA, [facility] [address], [address] [place], [place] [address] [phone] 10/18/16, 4:33 PM   Name: Derrick [name] MRN: [id number] Date of Birth: 06/26/1971

## 2016-10-19 ENCOUNTER — Encounter: Payer: Self-pay | Admitting: Physical Therapy

## 2016-10-19 ENCOUNTER — Ambulatory Visit: Payer: Medicare Other | Attending: Orthopedic Surgery | Admitting: Physical Therapy

## 2016-10-19 DIAGNOSIS — M79662 Pain in left lower leg: Secondary | ICD-10-CM | POA: Diagnosis present

## 2016-10-19 DIAGNOSIS — R29898 Other symptoms and signs involving the musculoskeletal system: Secondary | ICD-10-CM | POA: Insufficient documentation

## 2016-10-19 DIAGNOSIS — R2681 Unsteadiness on feet: Secondary | ICD-10-CM | POA: Diagnosis present

## 2016-10-19 DIAGNOSIS — M79661 Pain in right lower leg: Secondary | ICD-10-CM

## 2016-10-19 DIAGNOSIS — Z9181 History of falling: Secondary | ICD-10-CM | POA: Diagnosis present

## 2016-10-19 DIAGNOSIS — R2689 Other abnormalities of gait and mobility: Secondary | ICD-10-CM | POA: Diagnosis not present

## 2016-10-19 NOTE — Patient Instructions (Signed)
Do each exercise 1-2  times per day Do each exercise 10 repetitions Hold each exercise for 5 seconds to feel your location  AT SINK FIND YOUR MIDLINE POSITION AND PLACE FEET EQUAL DISTANCE FROM THE MIDLINE.  USE TAPE ON FLOOR TO MARK THE MIDLINE POSITION. You also should try to feel with your limb pressure in socket.  You are trying to feel with limb what you used to feel with the bottom of your foot.  1. Side to Side Shift: Moving your hips only (not shoulders): move weight onto your left leg, HOLD/FEEL.  Move back to equal weight on each leg, HOLD/FEEL. Move weight onto your right leg, HOLD/FEEL. Move back to equal weight on each leg, HOLD/FEEL. Repeat. 2. Front to Back Shift: Moving your hips only (not shoulders): move your weight forward onto your toes, HOLD/FEEL. Move your weight back to equal Flat Foot on both legs, HOLD/FEEL. Move your weight back onto your heels, HOLD/FEEL. Move your weight back to equal on both legs, HOLD/FEEL. Repeat. 3. Moving Cones / Cups: With equal weight on each leg: Hold on with one hand the first time, then progress to no hand supports. Move cups from one side of sink to the other. Place cups ~2" out of your reach, progress to 10" beyond reach. 4. Overhead/Upward Reaching: alternated reaching up to top cabinets or ceiling if no cabinets present. Keep equal weight on each leg. Start with one hand support on counter while other hand reaches and progress to no hand support with reaching. 5.   Looking Over Shoulders: With equal weight on each leg: alternate turning to look          over your shoulders with one hand support on counter as needed. Shift weight to             side looking, pull hip then shoulder then head/eyes around to look behind you. Start       with one hand support & progress to no hand support. 

## 2016-10-20 NOTE — Therapy (Signed)
[facility] [address] [place], [place], [address] Phone: [phone]   Fax:  [phone]  Physical Therapy Treatment  Patient Details  Name: Derrick [name] MRN: [id number] Date of Birth: 01-29-1971 Referring Provider: Aldean Baker, MD  Encounter Date: 10/19/2016      PT End of Session - 10/19/16 1542    Visit Number 3   Number of Visits 17   Date for PT Re-Evaluation 11/26/16   Authorization Type Medicare G-Code & progress note   PT Start Time 1535   PT Stop Time 1615   PT Time Calculation (min) 40 min   Equipment Utilized During Treatment Gait belt   Activity Tolerance Patient tolerated treatment well   Behavior During Therapy [facility] for tasks assessed/performed      Past Medical History:  Diagnosis Date  . Diabetes mellitus   . Peripheral neuropathy (HCC)   . Pneumonia   . Prostatitis     Past Surgical History:  Procedure Laterality Date  . AMPUTATION  05/31/2012   Procedure: AMPUTATION DIGIT;  Surgeon: Nadara Mustard, MD;  Location: [facility] OR;  Service: Orthopedics;  Laterality: Right;  right fourth and fifth toe ray amputation   . I&D EXTREMITY  08/02/2012   Procedure: IRRIGATION AND DEBRIDEMENT EXTREMITY;  Surgeon: Nadara Mustard, MD;  Location: [facility] OR;  Service: Orthopedics;  Laterality: Right;  Right Foot Irrigation and Debridement, Place Antibiotic Beads  . SHOULDER SURGERY Bilateral   . STUMP REVISION Left 07/12/2016   Procedure: Revision Left Below Knee Amputation;  Surgeon: Nadara Mustard, MD;  Location: [facility] OR;  Service: Orthopedics;  Laterality: Left;  . TOE AMPUTATION     multiple toes amputated.    There were no vitals filed for this visit.      Subjective Assessment - 10/19/16 1537    Subjective No new complaints. No falls. No change in pain. to clinic using single crutch today. crutch adjusted down to correct height.    Pertinent History DM (reports he does not check blood sugar or know his A1C),  peripheral neuropathy, Bil. shoulder surgeries, Bil. 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 Yes   Pain Score 8    Pain Location Knee   Pain Orientation Right   Pain Descriptors / Indicators Burning   Pain Type Chronic pain   Pain Onset More than a month ago   Pain Frequency Constant   Aggravating Factors  increased activity   Pain Relieving Factors resting (stitting), hot water (bath)   Pain Score 5   Pain Location Leg   Pain Orientation Left   Pain Descriptors / Indicators Burning   Pain Type Phantom pain   Pain Onset More than a month ago   Pain Frequency Constant  varies in intensity   Aggravating Factors  increased weight bearing through prosthesis   Pain Relieving Factors not putting pressure through prosthesis              [facility] Adult PT Treatment/Exercise - 10/19/16 1542      Transfers   Transfers Sit to Stand;Stand to Sit   Sit to Stand 5: Supervision;With upper extremity assist;With armrests;From chair/3-in-1   Stand to Sit 5: Supervision;With upper extremity assist;With armrests;To chair/3-in-1     Ambulation/Gait   Ambulation/Gait Yes   Ambulation/Gait Assistance 4: Min guard;5:  Supervision   Ambulation/Gait Assistance Details cues needed for sequencing with crutch/prosthesis, posture, step length and for normalized base of support with gait. Pt mostly supervision for balance, min guard when distracted/in busy enviroment                  Ambulation Distance (Feet) 120 Feet   Assistive device L Axillary Crutch;Prosthesis   Gait Pattern Step-through pattern;Decreased arm swing - left;Decreased step length - right;Decreased stance time - left;Decreased stride length;Decreased hip/knee flexion - right;Decreased hip/knee flexion - left;Decreased weight shift to  left;Trunk flexed;Abducted - left;Poor foot clearance - left;Poor foot clearance - right   Ambulation Surface Level;Indoor     Prosthetics   Prosthetic Care Comments  sock education initiated today, pt needs further instruction. Encouraged pt to work on getting 2cd wear each day, pt to try and incorportate this into his daily schedule   Current prosthetic wear tolerance (days/week)  daily   Current prosthetic wear tolerance (#hours/day)  2 hours 1x day   Residual limb condition  intact.small blister area is healing   Education Provided Residual limb care;Correct ply sock adjustment;Proper Donning;Proper Doffing;Proper wear schedule/adjustment;Proper weight-bearing schedule/adjustment   Person(s) Educated Patient   Education Method Explanation;Demonstration;Verbal cues   Education Method Verbalized understanding;Verbal cues required   Donning Prosthesis Supervision   Doffing Prosthesis Supervision            PT Education - 10/19/16 1557    Education provided Yes   Education Details HEP: sink program for balance and proprioception   Person(s) Educated Patient   Methods Explanation;Demonstration;Handout;Verbal cues   Comprehension Verbalized understanding;Returned demonstration;Verbal cues required;Need further instruction          PT Short Term Goals - 09/28/16 1730      PT SHORT TERM GOAL #1   Title Patient verbalizes proper cleaning & demonstrates proper donning of prosthesis. (Target Date: 10/27/2016)   Time 4   Period Weeks   Status New     PT SHORT TERM GOAL #2   Title Patient tolerates prosthesis wear >10hrs/ day without residual limb skin issues or limb pain >4/10. (Target Date: 10/27/2016)   Time 4   Period Weeks   Status New     PT SHORT TERM GOAL #3   Title Patient ambulates 300' with prosthesis & single point cane with superivision. (Target Date: 10/27/2016)   Time 4   Period Weeks   Status New     PT SHORT TERM GOAL #4   Title Patient negotiates ramps &  curbs with single point cane & prosthesis with supervision. (Target Date: 10/27/2016)   Time 4   Period Weeks   Status New     PT SHORT TERM GOAL #5   Title Patient picks up objects from floor with supervision. (Target Date: 10/27/2016)   Time 4   Period Weeks   Status New           PT Long Term Goals - 09/28/16 1730      PT LONG TERM GOAL #1   Title Patient tolerates wear of prosthesis >90% of awake hours without skin issues on residual limb or limb pain to enable function throughout his day. (Target Date: 11/24/2016)   Time 8   Period Weeks   Status New     PT LONG TERM GOAL #2   Title Patient verbalizes & demonstrates proper prosthetic care to enable safe prosthesis use (Target Date: 11/24/2016)   Time 8   Period Weeks   Status New  PT LONG TERM GOAL #3   Title Patient ambulates 1000' including grass, ramps, curbs & stairs (1 rail) with prosthesis & LRAD modified independent to enable community mobiity. (Target Date: 11/24/2016)   Time 8   Period Weeks   Status New     PT LONG TERM GOAL #4   Title Berg Balance >36/56 to indicate lower fall risk. (Target Date: 11/24/2016)   Time 8   Period Weeks   Status New     PT LONG TERM GOAL #5   Title Functional Gait Assessment with single point cane & prosthesis >/= 10/30 to indicate lower fall risk. (Target Date: 11/24/2016)   Time 8   Period Weeks   Status New     Additional Long Term Goals   Additional Long Term Goals Yes     PT LONG TERM GOAL #6   Title Patient demonstrates work & recreational activities using modified techniques safely. (Target Date: 11/24/2016)   Time 8   Period Weeks   Status New           Plan - 10/19/16 1542    Clinical Impression Statement Today's skilled session continued to address prosthetic management/education, as well as issuing HEP for balance/proprioception. Pt does appear to be more receptive to using an AD with gait as he is using a single axillary crutch right currently. Pt  does need continued education on sock ply mangement and reinforment of consistency with wear times for optimal progress/tolerance to prosthesis. Pt is making steady progress toward goals and should benefit from continued PT to progress toward unmet goals.    Rehab Potential Good   PT Frequency 2x / week   PT Duration 8 weeks   PT Treatment/Interventions ADLs/Self Care Home Management;DME Instruction;Gait training;Stair training;Functional mobility training;Therapeutic activities;Therapeutic exercise;Balance training;Neuromuscular re-education;Patient/family education;Prosthetic Training;Orthotic Fit/Training   PT Next Visit Plan Review prosthetic care,  prosthetic gait with crutch/es, intiate barriers, initiate balance activities   Consulted and Agree with Plan of Care Patient      Patient will benefit from skilled therapeutic intervention in order to improve the following deficits and impairments:  Abnormal gait, Decreased activity tolerance, Decreased balance, Decreased endurance, Decreased knowledge of use of DME, Decreased mobility, Postural dysfunction, Prosthetic Dependency, Pain  Visit Diagnosis: Other abnormalities of gait and mobility  Unsteadiness on feet  History of falling  Other symptoms and signs involving the musculoskeletal system  Pain in left lower leg  Pain in right lower leg     Problem List Patient Active Problem List   Diagnosis Date Noted  . Chronic osteomyelitis involving ankle and foot (HCC) 06/03/2012  . Syncope and collapse 04/12/2012  . Tobacco abuse 04/12/2012  . Cellulitis 02/25/2012  . Diabetic toe ulcer (HCC) 02/25/2012  . Diabetes mellitus (HCC) 02/25/2012  . Leukocytosis 02/25/2012    Sallyanne KusterKathy Bury, PTA, [facility] [address], [address] [place], [place] [address] [phone] 10/20/16, 4:03 PM   Name: Derrick [name] MRN: [id number] Date of Birth: 11/11/1971

## 2016-10-24 ENCOUNTER — Ambulatory Visit (INDEPENDENT_AMBULATORY_CARE_PROVIDER_SITE_OTHER): Payer: Medicaid Other | Admitting: Orthopedic Surgery

## 2016-10-24 ENCOUNTER — Encounter (INDEPENDENT_AMBULATORY_CARE_PROVIDER_SITE_OTHER): Payer: Self-pay | Admitting: Orthopedic Surgery

## 2016-10-24 VITALS — Ht 73.0 in | Wt 200.0 lb

## 2016-10-24 DIAGNOSIS — Z89512 Acquired absence of left leg below knee: Secondary | ICD-10-CM

## 2016-10-24 DIAGNOSIS — L97511 Non-pressure chronic ulcer of other part of right foot limited to breakdown of skin: Secondary | ICD-10-CM

## 2016-10-24 DIAGNOSIS — Z89431 Acquired absence of right foot: Secondary | ICD-10-CM

## 2016-10-24 MED ORDER — DOXYCYCLINE HYCLATE 100 MG PO TABS: 100.0000 mg | ORAL_TABLET | Freq: Two times a day (BID) | ORAL | 0 refills | Status: DC

## 2016-10-24 NOTE — Progress Notes (Signed)
Office Visit Note   Patient: Derrick [name]           Date of Birth: 10/29/1971           MRN: [id number] Visit Date: 10/24/2016              Requested by: No referring provider defined for this encounter. PCP: Pcp Not In System   Assessment & Plan: Visit Diagnoses:  1. 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 Ulcer of right foot, limited to breakdown of skin (HCC)   3. Status post transmetatarsal amputation of foot, right (HCC)     Plan: Continue Silvadene dressing changes daily. The importance of nonweightbearing was discussed. Discussed the patient must hold on his physical therapy and hold on ambulation of the right foot. He is to continue with Silvadene dressing changes daily. After debridement the ulcer had good granulation tissue there was no cellulitis no drainage no signs of infection. Patient has been having GI symptoms with drinking dark sodas. He states he does not have problems drinking Sprite or clear sodas. Discussed the importance of not drinking sodas. Discussed the importance of him following up with his primary care physician for evaluation for endoscopy. Patient states he had been scheduled for endoscopy before but he did not follow through with this. We will plan to follow-up with him closely in 1 week. Prescription called in for doxycycline.  Follow-Up Instructions: Return in about 1 week (around 10/31/2016).   Orders:  No orders of the defined types were placed in this encounter.  Meds ordered this encounter  Medications  . doxycycline (VIBRA-TABS) 100 MG tablet    Sig: Take 1 tablet (100 mg total) by mouth 2 (two) times daily.    Dispense:  60 tablet    Refill:  0      Procedures: No procedures performed   Clinical Data: No additional findings.   Subjective: Chief Complaint  Patient presents with  . Left Leg - Follow-up    Left below the knee amputation revision on 07/12/16    Pateint is s/p a left below the knee  revision amputation on 07/12/16    Review of Systems   Objective: Vital Signs: Ht 6\' 1"  (1.854 m)   Wt 200 lb (90.7 kg)   BMI 26.39 kg/m   Physical Exam on examination patient is alert oriented no adenopathy well-dressed normal affect normal respiratory effort he is afebrile. Examination patient has a stable left transtibial amputation exam is the right foot he is full weightbearing in a regular shoe at this time. No assistive devices. The shoe and sock are removed he has hair and dirt in the wound. There is necrotic tissue after informed consent a 10 blade knife was used to debride the skin and soft tissue back to healthy viable granulation tissue the wound was 6 cm in diameter after debridement and 1 mm deep this does not probe to bone or tendon there is no cellulitis in the foot or ankle.  Ortho Exam  Specialty Comments:  No specialty comments available.  Imaging: No results found.   PMFS History: Patient Active Problem List   Diagnosis Date Noted  . Chronic osteomyelitis involving ankle and foot (HCC) 06/03/2012  . Syncope and collapse 04/12/2012  . Tobacco abuse 04/12/2012  . Cellulitis 02/25/2012  . Diabetic toe ulcer (HCC) 02/25/2012  . Diabetes mellitus (HCC) 02/25/2012  . Leukocytosis 02/25/2012   Past Medical History:  Diagnosis Date  .  Diabetes mellitus   . Peripheral neuropathy (HCC)   . Pneumonia   . Prostatitis     Family History  Problem Relation Age of Onset  . Cancer Mother   . Congestive Heart Failure Father     Past Surgical History:  Procedure Laterality Date  . AMPUTATION  05/31/2012   Procedure: AMPUTATION DIGIT;  Surgeon: Nadara MustardMarcus V Lyfe Reihl, MD;  [facility];  Service: Orthopedics;  Laterality: Right;  right fourth and fifth toe ray amputation   . I&D EXTREMITY  08/02/2012   Procedure: IRRIGATION AND DEBRIDEMENT EXTREMITY;  Surgeon: Nadara MustardMarcus V Donnel Venuto, MD;  Location: [facility] OR;  Service: Orthopedics;  Laterality: Right;  Right Foot Irrigation and  Debridement, Place Antibiotic Beads  . SHOULDER SURGERY Bilateral   . STUMP REVISION Left 07/12/2016   Procedure: Revision Left Below Knee Amputation;  Surgeon: Nadara MustardMarcus V Tramond Slinker, MD;  Location: [facility] OR;  Service: Orthopedics;  Laterality: Left;  . TOE AMPUTATION     multiple toes amputated.   Social History   Occupational History  . Not on file.   Social History Main Topics  . Smoking status: Current Every Day Smoker    Packs/day: 2.00    Years: 24.00    Types: Cigarettes  . Smokeless tobacco: Never Used  . Alcohol use No  . Drug use: No  . Sexual activity: Yes    Birth control/ protection: None

## 2016-10-26 ENCOUNTER — Encounter: Payer: Medicare Other | Admitting: Physical Therapy

## 2016-10-31 ENCOUNTER — Encounter: Payer: Medicare Other | Admitting: Physical Therapy

## 2016-11-07 ENCOUNTER — Encounter: Payer: Medicare Other | Admitting: Physical Therapy

## 2016-11-14 ENCOUNTER — Encounter: Payer: Medicare Other | Admitting: Physical Therapy

## 2016-11-16 ENCOUNTER — Encounter: Payer: Medicare Other | Admitting: Physical Therapy

## 2016-11-20 ENCOUNTER — Encounter: Payer: Medicare Other | Admitting: Physical Therapy

## 2016-11-23 ENCOUNTER — Encounter: Payer: Medicare Other | Admitting: Physical Therapy

## 2016-11-28 ENCOUNTER — Encounter: Payer: Medicare Other | Admitting: Physical Therapy

## 2016-11-30 ENCOUNTER — Encounter: Payer: Medicare Other | Admitting: Physical Therapy

## 2016-12-05 ENCOUNTER — Encounter: Payer: Medicare Other | Admitting: Physical Therapy

## 2016-12-07 ENCOUNTER — Encounter: Payer: Self-pay | Admitting: Physical Therapy

## 2016-12-07 ENCOUNTER — Encounter: Payer: Medicare Other | Admitting: Physical Therapy

## 2016-12-07 NOTE — Therapy (Signed)
[facility] [address] [place], [place], [address] Phone: [phone]   Fax:  [phone]  Patient Details  Name: Derrick [name] MRN: [id number] Date of Birth: [birth year] Referring Provider:  Meridee Score, MD  Encounter Date: 12/07/2016  PHYSICAL THERAPY DISCHARGE SUMMARY  Visits from Start of Care: 3  Current functional level related to goals / functional outcomes: Patient was discharged due to wounds on non-amputated foot and need to limit weight bearing.    Remaining deficits: Unknown as not see for formal discharge. PT received phone message that no more PT due to wounds.    Education / Equipment: Initiated prosthetic care.   Plan: Patient agrees to discharge.  Patient goals were not met. Patient is being discharged due to a change in medical status.  ?????        Danell Verno PT, DPT 12/07/2016, 11:39 AM  [facility] [address] [place], [place], [address] Phone: [phone]   Fax:  [phone]

## 2016-12-12 ENCOUNTER — Encounter: Payer: Medicare Other | Admitting: Physical Therapy

## 2016-12-13 ENCOUNTER — Emergency Department (HOSPITAL_COMMUNITY): Payer: Medicare Other

## 2016-12-13 ENCOUNTER — Encounter (HOSPITAL_COMMUNITY): Payer: Self-pay | Admitting: *Deleted

## 2016-12-13 ENCOUNTER — Inpatient Hospital Stay (HOSPITAL_COMMUNITY): Payer: Medicare Other

## 2016-12-13 ENCOUNTER — Inpatient Hospital Stay (HOSPITAL_COMMUNITY)
Admission: EM | Admit: 2016-12-13 | Discharge: 2016-12-15 | DRG: 853 | Disposition: A | Discharge status: Home / Self Care | Payer: Medicare Other | Attending: Internal Medicine | Admitting: Internal Medicine

## 2016-12-13 DIAGNOSIS — Z9119 Patient's noncompliance with other medical treatment and regimen: Secondary | ICD-10-CM | POA: Diagnosis not present

## 2016-12-13 DIAGNOSIS — L089 Local infection of the skin and subcutaneous tissue, unspecified: Secondary | ICD-10-CM | POA: Diagnosis present

## 2016-12-13 DIAGNOSIS — Z8249 Family history of ischemic heart disease and other diseases of the circulatory system: Secondary | ICD-10-CM

## 2016-12-13 DIAGNOSIS — E876 Hypokalemia: Secondary | ICD-10-CM | POA: Diagnosis present

## 2016-12-13 DIAGNOSIS — E11610 Type 2 diabetes mellitus with diabetic neuropathic arthropathy: Secondary | ICD-10-CM | POA: Diagnosis present

## 2016-12-13 DIAGNOSIS — Z8614 Personal history of Methicillin resistant Staphylococcus aureus infection: Secondary | ICD-10-CM | POA: Diagnosis not present

## 2016-12-13 DIAGNOSIS — M869 Osteomyelitis, unspecified: Secondary | ICD-10-CM | POA: Diagnosis present

## 2016-12-13 DIAGNOSIS — Z720 Tobacco use: Secondary | ICD-10-CM | POA: Diagnosis present

## 2016-12-13 DIAGNOSIS — R6521 Severe sepsis with septic shock: Secondary | ICD-10-CM

## 2016-12-13 DIAGNOSIS — S90821A Blister (nonthermal), right foot, initial encounter: Secondary | ICD-10-CM | POA: Diagnosis present

## 2016-12-13 DIAGNOSIS — E118 Type 2 diabetes mellitus with unspecified complications: Secondary | ICD-10-CM

## 2016-12-13 DIAGNOSIS — E11621 Type 2 diabetes mellitus with foot ulcer: Secondary | ICD-10-CM | POA: Diagnosis present

## 2016-12-13 DIAGNOSIS — Z79899 Other long term (current) drug therapy: Secondary | ICD-10-CM | POA: Diagnosis not present

## 2016-12-13 DIAGNOSIS — M79604 Pain in right leg: Secondary | ICD-10-CM

## 2016-12-13 DIAGNOSIS — E1165 Type 2 diabetes mellitus with hyperglycemia: Secondary | ICD-10-CM | POA: Diagnosis present

## 2016-12-13 DIAGNOSIS — Z794 Long term (current) use of insulin: Secondary | ICD-10-CM

## 2016-12-13 DIAGNOSIS — E11628 Type 2 diabetes mellitus with other skin complications: Secondary | ICD-10-CM | POA: Diagnosis present

## 2016-12-13 DIAGNOSIS — I10 Essential (primary) hypertension: Secondary | ICD-10-CM | POA: Diagnosis present

## 2016-12-13 DIAGNOSIS — E1142 Type 2 diabetes mellitus with diabetic polyneuropathy: Secondary | ICD-10-CM | POA: Diagnosis present

## 2016-12-13 DIAGNOSIS — L97519 Non-pressure chronic ulcer of other part of right foot with unspecified severity: Secondary | ICD-10-CM | POA: Diagnosis present

## 2016-12-13 DIAGNOSIS — F17210 Nicotine dependence, cigarettes, uncomplicated: Secondary | ICD-10-CM | POA: Diagnosis present

## 2016-12-13 DIAGNOSIS — Z881 Allergy status to other antibiotic agents status: Secondary | ICD-10-CM | POA: Diagnosis not present

## 2016-12-13 DIAGNOSIS — E1169 Type 2 diabetes mellitus with other specified complication: Secondary | ICD-10-CM | POA: Diagnosis present

## 2016-12-13 DIAGNOSIS — A419 Sepsis, unspecified organism: Secondary | ICD-10-CM | POA: Diagnosis present

## 2016-12-13 DIAGNOSIS — M726 Necrotizing fasciitis: Secondary | ICD-10-CM | POA: Diagnosis present

## 2016-12-13 DIAGNOSIS — E1152 Type 2 diabetes mellitus with diabetic peripheral angiopathy with gangrene: Secondary | ICD-10-CM

## 2016-12-13 DIAGNOSIS — T8740 Infection of amputation stump, unspecified extremity: Secondary | ICD-10-CM | POA: Diagnosis not present

## 2016-12-13 DIAGNOSIS — T8781 Dehiscence of amputation stump: Secondary | ICD-10-CM | POA: Diagnosis not present

## 2016-12-13 DIAGNOSIS — X58XXXA Exposure to other specified factors, initial encounter: Secondary | ICD-10-CM | POA: Diagnosis present

## 2016-12-13 DIAGNOSIS — R29898 Other symptoms and signs involving the musculoskeletal system: Secondary | ICD-10-CM

## 2016-12-13 LAB — COMPREHENSIVE METABOLIC PANEL
ALBUMIN: 2.3 g/dL — AB (ref 3.5–5.0)
ALK PHOS: 256 U/L — AB (ref 38–126)
ALT: 19 U/L (ref 17–63)
AST: 32 U/L (ref 15–41)
Anion gap: 16 — ABNORMAL HIGH (ref 5–15)
BUN: 6 mg/dL (ref 6–20)
CALCIUM: 8.6 mg/dL — AB (ref 8.9–10.3)
CO2: 27 mmol/L (ref 22–32)
CREATININE: 1.07 mg/dL (ref 0.61–1.24)
Chloride: 88 mmol/L — ABNORMAL LOW (ref 101–111)
GFR calc non Af Amer: >60 mL/min (ref >60)
GLUCOSE: 497 mg/dL — AB (ref 65–99)
Potassium: 3 mmol/L — ABNORMAL LOW (ref 3.5–5.1)
SODIUM: 131 mmol/L — AB (ref 135–145)
Total Bilirubin: 0.5 mg/dL (ref 0.3–1.2)
Total Protein: 8.9 g/dL — ABNORMAL HIGH (ref 6.5–8.1)

## 2016-12-13 LAB — CBC WITH DIFFERENTIAL/PLATELET
BASOS PCT: 0 %
Basophils Absolute: 0 10*3/uL (ref 0.0–0.1)
EOS ABS: 0.3 10*3/uL (ref 0.0–0.7)
EOS PCT: 1 %
HCT: 38.8 % — ABNORMAL LOW (ref 39.0–52.0)
HEMOGLOBIN: 13.2 g/dL (ref 13.0–17.0)
LYMPHS PCT: 13 %
Lymphs Abs: 3.8 10*3/uL (ref 0.7–4.0)
MCH: 26.8 pg (ref 26.0–34.0)
MCHC: 34 g/dL (ref 30.0–36.0)
MCV: 78.9 fL (ref 78.0–100.0)
Monocytes Absolute: 1.8 10*3/uL — ABNORMAL HIGH (ref 0.1–1.0)
Monocytes Relative: 6 %
NEUTROS PCT: 80 %
Neutro Abs: 23.3 10*3/uL — ABNORMAL HIGH (ref 1.7–7.7)
Platelets: 578 10*3/uL — ABNORMAL HIGH (ref 150–400)
RBC: 4.92 MIL/uL (ref 4.22–5.81)
RDW: 13.9 % (ref 11.5–15.5)
WBC: 29.2 10*3/uL — ABNORMAL HIGH (ref 4.0–10.5)

## 2016-12-13 LAB — BASIC METABOLIC PANEL
Anion gap: 13 (ref 5–15)
BUN: 5 mg/dL — ABNORMAL LOW (ref 6–20)
CALCIUM: 8.1 mg/dL — AB (ref 8.9–10.3)
CO2: 27 mmol/L (ref 22–32)
CREATININE: 0.83 mg/dL (ref 0.61–1.24)
Chloride: 91 mmol/L — ABNORMAL LOW (ref 101–111)
GFR calc Af Amer: >60 mL/min (ref >60)
GFR calc non Af Amer: >60 mL/min (ref >60)
GLUCOSE: 385 mg/dL — AB (ref 65–99)
Potassium: 3 mmol/L — ABNORMAL LOW (ref 3.5–5.1)
Sodium: 131 mmol/L — ABNORMAL LOW (ref 135–145)

## 2016-12-13 LAB — GLUCOSE, CAPILLARY
GLUCOSE-CAPILLARY: 305 mg/dL — AB (ref 65–99)
GLUCOSE-CAPILLARY: 294 mg/dL — AB (ref 65–99)
GLUCOSE-CAPILLARY: 337 mg/dL — AB (ref 65–99)

## 2016-12-13 LAB — I-STAT CG4 LACTIC ACID, ED: Lactic Acid, Venous: 3.24 mmol/L (ref 0.5–1.9)

## 2016-12-13 LAB — LACTIC ACID, PLASMA: Lactic Acid, Venous: 1.7 mmol/L (ref 0.5–1.9)

## 2016-12-13 LAB — MAGNESIUM: MAGNESIUM: 1.1 mg/dL — AB (ref 1.7–2.4)

## 2016-12-13 MED ORDER — INSULIN GLARGINE 100 UNIT/ML ~~LOC~~ SOLN
20.0000 [IU] | Freq: Every day | SUBCUTANEOUS | Status: DC
  Filled 2016-12-13 (×2): qty 0.2

## 2016-12-13 MED ORDER — ACETAMINOPHEN 325 MG PO TABS: 650.0000 mg | ORAL_TABLET | Freq: Four times a day (QID) | ORAL | Status: DC | PRN

## 2016-12-13 MED ORDER — SODIUM CHLORIDE 0.9 % IV BOLUS (SEPSIS)
1000.0000 mL | Freq: Once | INTRAVENOUS | Status: AC
Start: 2016-12-13 — End: 2016-12-13
  Administered 2016-12-13: 1000 mL via INTRAVENOUS

## 2016-12-13 MED ORDER — ZOLPIDEM TARTRATE 5 MG PO TABS
5.0000 mg | ORAL_TABLET | Freq: Every evening | ORAL | Status: DC | PRN
  Filled 2016-12-13 (×2): qty 1

## 2016-12-13 MED ORDER — PIPERACILLIN-TAZOBACTAM 3.375 G IVPB 30 MIN: 3.3750 g | INTRAVENOUS | Status: DC

## 2016-12-13 MED ORDER — OXYCODONE HCL 5 MG PO TABS
5.0000 mg | ORAL_TABLET | ORAL | Status: DC | PRN
  Administered 2016-12-13 – 2016-12-15 (×4): 5 mg via ORAL
  Filled 2016-12-13 (×6): qty 1

## 2016-12-13 MED ORDER — ADULT MULTIVITAMIN W/MINERALS CH
1.0000 | ORAL_TABLET | Freq: Every day | ORAL | Status: DC
  Filled 2016-12-13 (×2): qty 1

## 2016-12-13 MED ORDER — GADOBENATE DIMEGLUMINE 529 MG/ML IV SOLN
20.0000 mL | Freq: Once | INTRAVENOUS | Status: AC
  Administered 2016-12-13: 20 mL via INTRAVENOUS

## 2016-12-13 MED ORDER — LINEZOLID 600 MG/300ML IV SOLN
600.0000 mg | Freq: Two times a day (BID) | INTRAVENOUS | Status: DC
  Administered 2016-12-13 – 2016-12-15 (×3): 600 mg via INTRAVENOUS
  Filled 2016-12-13 (×4): qty 300

## 2016-12-13 MED ORDER — POTASSIUM CHLORIDE CRYS ER 20 MEQ PO TBCR
40.0000 meq | EXTENDED_RELEASE_TABLET | Freq: Once | ORAL | Status: AC
  Administered 2016-12-14: 40 meq via ORAL
  Filled 2016-12-13: qty 2

## 2016-12-13 MED ORDER — SODIUM CHLORIDE 0.9 % IV SOLN
INTRAVENOUS | Status: DC
  Administered 2016-12-13: 22:00:00 via INTRAVENOUS
  Filled 2016-12-13 (×7): qty 1000

## 2016-12-13 MED ORDER — OXYCODONE-ACETAMINOPHEN 10-325 MG PO TABS: 1.0000 | ORAL_TABLET | ORAL | Status: DC | PRN

## 2016-12-13 MED ORDER — INSULIN GLARGINE 100 UNIT/ML ~~LOC~~ SOLN
20.0000 [IU] | Freq: Every day | SUBCUTANEOUS | Status: DC
  Filled 2016-12-13: qty 0.2

## 2016-12-13 MED ORDER — SODIUM CHLORIDE 0.9 % IV SOLN
INTRAVENOUS | Status: DC
  Administered 2016-12-13: 05:00:00 via INTRAVENOUS

## 2016-12-13 MED ORDER — INSULIN ASPART 100 UNIT/ML ~~LOC~~ SOLN: 0.0000 [IU] | Freq: Every day | SUBCUTANEOUS | Status: DC

## 2016-12-13 MED ORDER — MAGNESIUM SULFATE 4 GM/100ML IV SOLN
4.0000 g | Freq: Once | INTRAVENOUS | Status: AC
  Administered 2016-12-14: 4 g via INTRAVENOUS
  Filled 2016-12-13: qty 100

## 2016-12-13 MED ORDER — ACETAMINOPHEN 650 MG RE SUPP: 650.0000 mg | Freq: Four times a day (QID) | RECTAL | Status: DC | PRN

## 2016-12-13 MED ORDER — LISINOPRIL 10 MG PO TABS
10.0000 mg | ORAL_TABLET | Freq: Every day | ORAL | Status: DC
  Administered 2016-12-13: 10 mg via ORAL
  Filled 2016-12-13 (×2): qty 1

## 2016-12-13 MED ORDER — SODIUM CHLORIDE 0.9% FLUSH
3.0000 mL | Freq: Two times a day (BID) | INTRAVENOUS | Status: DC
  Administered 2016-12-13 (×2): 3 mL via INTRAVENOUS

## 2016-12-13 MED ORDER — POTASSIUM CHLORIDE 20 MEQ/15ML (10%) PO SOLN
40.0000 meq | Freq: Once | ORAL | Status: DC
  Filled 2016-12-13: qty 30

## 2016-12-13 MED ORDER — HYDROMORPHONE HCL 2 MG/ML IJ SOLN
1.0000 mg | Freq: Once | INTRAMUSCULAR | Status: AC
  Administered 2016-12-13: 1 mg via INTRAVENOUS
  Filled 2016-12-13: qty 1

## 2016-12-13 MED ORDER — SODIUM CHLORIDE 0.9 % IV BOLUS (SEPSIS): 1000.0000 mL | Freq: Once | INTRAVENOUS | Status: DC

## 2016-12-13 MED ORDER — INSULIN ASPART 100 UNIT/ML ~~LOC~~ SOLN
0.0000 [IU] | SUBCUTANEOUS | Status: DC
  Administered 2016-12-13: 5 [IU] via SUBCUTANEOUS
  Administered 2016-12-13: 7 [IU] via SUBCUTANEOUS

## 2016-12-13 MED ORDER — INSULIN ASPART 100 UNIT/ML ~~LOC~~ SOLN
0.0000 [IU] | Freq: Three times a day (TID) | SUBCUTANEOUS | Status: DC
  Administered 2016-12-14: 15 [IU] via SUBCUTANEOUS

## 2016-12-13 MED ORDER — INSULIN ASPART 100 UNIT/ML ~~LOC~~ SOLN: 0.0000 [IU] | Freq: Three times a day (TID) | SUBCUTANEOUS | Status: DC

## 2016-12-13 MED ORDER — PIPERACILLIN-TAZOBACTAM 3.375 G IVPB 30 MIN
3.3750 g | Freq: Once | INTRAVENOUS | Status: DC
  Administered 2016-12-13: 3.375 g via INTRAVENOUS
  Filled 2016-12-13: qty 50

## 2016-12-13 MED ORDER — CLINDAMYCIN PHOSPHATE 900 MG/50ML IV SOLN
900.0000 mg | Freq: Three times a day (TID) | INTRAVENOUS | Status: DC
  Administered 2016-12-13 – 2016-12-15 (×5): 900 mg via INTRAVENOUS
  Filled 2016-12-13 (×9): qty 50

## 2016-12-13 MED ORDER — SODIUM CHLORIDE 0.9 % IV BOLUS (SEPSIS)
1000.0000 mL | Freq: Once | INTRAVENOUS | Status: DC
Start: 2016-12-13 — End: 2016-12-13

## 2016-12-13 MED ORDER — OXYCODONE-ACETAMINOPHEN 5-325 MG PO TABS
1.0000 | ORAL_TABLET | ORAL | Status: DC | PRN
Start: 2016-12-13 — End: 2016-12-15
  Administered 2016-12-13 – 2016-12-15 (×4): 1 via ORAL
  Filled 2016-12-13 (×6): qty 1

## 2016-12-13 MED ORDER — LINEZOLID 600 MG/300ML IV SOLN
600.0000 mg | Freq: Once | INTRAVENOUS | Status: AC
  Administered 2016-12-13: 600 mg via INTRAVENOUS
  Filled 2016-12-13: qty 300

## 2016-12-13 MED ORDER — PIPERACILLIN-TAZOBACTAM 3.375 G IVPB
3.3750 g | Freq: Three times a day (TID) | INTRAVENOUS | Status: DC
  Administered 2016-12-13 – 2016-12-14 (×2): 3.375 g via INTRAVENOUS
  Filled 2016-12-13 (×7): qty 50

## 2016-12-13 MED ORDER — SODIUM CHLORIDE 0.9 % IV SOLN: INTRAVENOUS | Status: DC

## 2016-12-13 MED ORDER — NICOTINE 21 MG/24HR TD PT24
21.0000 mg | MEDICATED_PATCH | Freq: Every day | TRANSDERMAL | Status: DC
  Filled 2016-12-13: qty 1

## 2016-12-13 NOTE — H&P (Signed)
History and Physical    Derrick [name] OVA:[id number] DOB: [birth year] DOA: 12/13/2016  Referring MD/NP/PA:   PCP: Pcp Not In System   Patient coming from:  The patient is coming from home.  At baseline, pt is independent for most of ADL.  Chief Complaint: right foot lesion and infection  HPI: Derrick [name] is a 45 y.o. male with medical history significant of hypertension, diabetes mellitus, peripheral neuropathy, tobacco abuse, prostatitis, s/p of L BKA and R toe amputation, who presents with a right foot lesion and infection.  Patient has chronic right foot diabetic lesion, which has worsened in the past 6 days. Initially noticed blisters that popped open and had foul smelling purulent drain. Pt states that his right foot "has a hole in it."  He had fever of 103.5 and at one time point at home, and chills. Has nausea and vomited twice yesterday. Denies abdominal pain or diarrhea. Pt notes that he has no much feeling in his right foot due to neuropathy at baseline, but now he has moderate pain. He denies symptoms of UTI or unilateral weakness.  ED Course: pt was found to have WBC 29.2, lactate is 3.24, potassium 3.0, blood sugar 497 with an 16 and bicarbonate 27, pseudohyponatremia, temperature normal, tachycardia, tachypnea, and saturation 95% on room air. Pt is admitted to telemetry bed as inpatient.  X-ray of right foot showed: 1. Diffuse soft tissue air tracking about the hindfoot and midfoot, extending about the heel, highly suspicious for necrotizing fasciitis. Mild soft tissue air extends into the remaining forefoot. Small amount of air noted at the posterior lower leg. 2. New diffuse degenerative change about the midfoot. Underlying osteomyelitis cannot be excluded. Chronic partial resorption of the cuboid.  Review of Systems:   General: has fevers, chills, no changes in body weight, has fatigue HEENT: no blurry vision, hearing changes or sore throat Respiratory: no  dyspnea, coughing, wheezing CV: no chest pain, no palpitations GI: has nausea, vomiting, no abdominal pain, diarrhea, constipation GU: no dysuria, burning on urination, increased urinary frequency, hematuria  Ext: no leg edema.  Neuro: no unilateral weakness, numbness, or tingling, no vision change or hearing loss Skin: has diabetic ulcer in right foot with infection MSK: No muscle spasm, no deformity, no limitation of range of movement in spin Heme: No easy bruising.  Travel history: No recent long distant travel.  Allergy:  Allergies  Allergen Reactions  . Vancomycin Other (See Comments)    Acute kidney injury    Past Medical History:  Diagnosis Date  . Diabetes mellitus   . Peripheral neuropathy ([place])   . Pneumonia   . Prostatitis     Past Surgical History:  Procedure Laterality Date  . AMPUTATION  05/31/2012   Procedure: AMPUTATION DIGIT;  Surgeon: Newt Minion, MD;  Location: [facility];  Service: Orthopedics;  Laterality: Right;  right fourth and fifth toe ray amputation   . I&D EXTREMITY  08/02/2012   Procedure: IRRIGATION AND DEBRIDEMENT EXTREMITY;  Surgeon: Newt Minion, MD;  Location: [facility];  Service: Orthopedics;  Laterality: Right;  Right Foot Irrigation and Debridement, Place Antibiotic Beads  . SHOULDER SURGERY Bilateral   . STUMP REVISION Left 07/12/2016   Procedure: Revision Left Below Knee Amputation;  Surgeon: Newt Minion, MD;  Location: [facility];  Service: Orthopedics;  Laterality: Left;  . TOE AMPUTATION     multiple toes amputated.    Social History:  reports that he has been smoking Cigarettes.  He  has a 48.00 pack-year smoking history. He has never used smokeless tobacco. He reports that he does not drink alcohol or use drugs.  Family History:  Family History  Problem Relation Age of Onset  . Cancer Mother   . Congestive Heart Failure Father      Prior to Admission medications   Medication Sig Start Date End Date Taking? Authorizing Provider    doxycycline (VIBRA-TABS) 100 MG tablet Take 1 tablet (100 mg total) by mouth 2 (two) times daily. 10/24/16   Newt Minion, MD  glipiZIDE (GLUCOTROL) 10 MG tablet Take 10 mg by mouth 2 (two) times daily. 05/23/16 05/23/17  Historical Provider, MD  insulin glargine (LANTUS) 100 UNIT/ML injection Inject 20 Units into the skin at bedtime.  06/02/16 06/02/17  Historical Provider, MD  lisinopril (PRINIVIL,ZESTRIL) 10 MG tablet Take 10 mg by mouth daily.  05/25/16 05/25/17  Historical Provider, MD  oxyCODONE-acetaminophen (PERCOCET) 10-325 MG tablet Take 1 tablet by mouth every 4 (four) hours as needed for pain. 07/12/16   Newt Minion, MD    Physical Exam: Vitals:   12/13/16 0315 12/13/16 0345 12/13/16 0415 12/13/16 0501  BP: 95/58 117/58 115/64 136/75  Pulse: 105 104 100 102  Resp:    18  Temp:    98.5 F (36.9 C)  TempSrc:      SpO2: 96% 98% 99% 97%   General: Not in acute distress HEENT:       Eyes: PERRL, EOMI, no scleral icterus.       ENT: No discharge from the ears and nose, no pharynx injection, no tonsillar enlargement.        Neck: No JVD, no bruit, no mass felt. Heme: No neck lymph node enlargement. Cardiac: S1/S2, RRR, No murmurs, No gallops or rubs. Respiratory:  No rales, wheezing, rhonchi or rubs. GI: Soft, nondistended, nontender, no rebound pain, no organomegaly, BS present. GU: No hematuria Ext: No pitting leg edema bilaterally. 1+DP/PT pulse bilaterally. S/p of L BKA and amputation of R toes. Musculoskeletal: No joint deformities, No joint redness or warmth, no limitation of ROM in spin. Skin: pt has three lesion in right foot. Under medial malleolus, 4x5 cm necrotic wound draining pus and foul smelling with surrounding erythema and induration. Under lateral malleolus, 1x1 cm necrotic ulcer that is draining pus. 5x5cm open diabetic ulcer with purulent draining over bottom of foot  Neuro: Alert, oriented X3, cranial nerves II-XII grossly intact, moves all extremities normally.    Psych: Patient is not psychotic, no suicidal or hemocidal ideation.  Labs on Admission: I have personally reviewed following labs and imaging studies  CBC:  Recent Labs Lab 12/13/16 0021  WBC 29.2*  NEUTROABS 23.3*  HGB 13.2  HCT 38.8*  MCV 78.9  PLT 500*   Basic Metabolic Panel:  Recent Labs Lab 12/13/16 0021  NA 131*  K 3.0*  CL 88*  CO2 27  GLUCOSE 497*  BUN 6  CREATININE 1.07  CALCIUM 8.6*   GFR: CrCl cannot be calculated (Unknown ideal weight.). Liver Function Tests:  Recent Labs Lab 12/13/16 0021  AST 32  ALT 19  ALKPHOS 256*  BILITOT 0.5  PROT 8.9*  ALBUMIN 2.3*   No results for input(s): LIPASE, AMYLASE in the last 168 hours. No results for input(s): AMMONIA in the last 168 hours. Coagulation Profile: No results for input(s): INR, PROTIME in the last 168 hours. Cardiac Enzymes: No results for input(s): CKTOTAL, CKMB, CKMBINDEX, TROPONINI in the last 168 hours. BNP (last 3  results) No results for input(s): PROBNP in the last 8760 hours. HbA1C: No results for input(s): HGBA1C in the last 72 hours. CBG: No results for input(s): GLUCAP in the last 168 hours. Lipid Profile: No results for input(s): CHOL, HDL, LDLCALC, TRIG, CHOLHDL, LDLDIRECT in the last 72 hours. Thyroid Function Tests: No results for input(s): TSH, T4TOTAL, FREET4, T3FREE, THYROIDAB in the last 72 hours. Anemia Panel: No results for input(s): VITAMINB12, FOLATE, FERRITIN, TIBC, IRON, RETICCTPCT in the last 72 hours. Urine analysis:    Component Value Date/Time   COLORURINE YELLOW 02/03/2013 0227   APPEARANCEUR CLEAR 02/03/2013 0227   LABSPEC >1.046 (H) 02/03/2013 0227   PHURINE 6.0 02/03/2013 0227   GLUCOSEU >1000 (A) 02/03/2013 0227   HGBUR TRACE (A) 02/03/2013 0227   BILIRUBINUR NEGATIVE 02/03/2013 0227   KETONESUR NEGATIVE 02/03/2013 0227   PROTEINUR NEGATIVE 02/03/2013 0227   UROBILINOGEN 0.2 02/03/2013 0227   NITRITE NEGATIVE 02/03/2013 0227   LEUKOCYTESUR  NEGATIVE 02/03/2013 0227   Sepsis Labs: '@LABRCNTIP' (procalcitonin:4,lacticidven:4) )No results found for this or any previous visit (from the past 240 hour(s)).   Radiological Exams on Admission: Dg Foot Complete Right  Result Date: 12/13/2016 CLINICAL DATA:  Acute onset of lateral right foot and heel discoloration and pain. Initial encounter. EXAM: RIGHT FOOT COMPLETE - 3+ VIEW COMPARISON:  Right foot radiographs performed 09/17/2012 FINDINGS: Diffuse soft tissue air is seen tracking about the hindfoot and midfoot, extending about the heel, highly suspicious for necrotizing fasciitis. Diffuse soft tissue swelling is noted. Mild soft tissue air extends into the remaining forefoot. A small amount of air is noted at the posterior lower leg. The patient is status post amputation along the metatarsals. There is diffuse degenerative change about the midfoot. Underlying osteomyelitis cannot be excluded. There is chronic partial resorption of the cuboid. IMPRESSION: 1. Diffuse soft tissue air tracking about the hindfoot and midfoot, extending about the heel, highly suspicious for necrotizing fasciitis. Mild soft tissue air extends into the remaining forefoot. Small amount of air noted at the posterior lower leg. 2. New diffuse degenerative change about the midfoot. Underlying osteomyelitis cannot be excluded. Chronic partial resorption of the cuboid. These results were called by telephone at the time of interpretation on 12/13/2016 at 1:11 am to Dr. Brantley Stage, who verbally acknowledged these results. Electronically Signed   By: Garald Balding M.D.   On: 12/13/2016 01:13     EKG: Not done in ED, will get one.   Assessment/Plan Principal Problem:   Diabetic foot infection ([place]) Active Problems:   Diabetes mellitus ([place])   Tobacco abuse   HTN (hypertension)   Sepsis ([place])   Hypokalemia   Diabetic foot infection and sepsis: Patient is septic with leukocytosis, elevated lactate, tachycardia and  tachypnea. Currently hemodynamically stable. X-ray of right foot showed possible necrotizing fasciitis and cannot rule out osteomyelitis. Pt states that he only wants Dr. Sharol Given to do any surgery for him.   - will admit to tele bed - Empiric antimicrobial treatment with Zosyn, clindamycin and one dose of Linezolid. This was discussed with pharmacist. Per pharmacist, need to call ID in AM to prove continuation of Linezolid in AM. - PRN Zofran for nausea, Percocet for pain - Blood cultures x 2  - ESR and CRP - wound care consult - MRI-right foot and ABI - will get Procalcitonin and trend lactic acid levels per sepsis protocol. - IVF: 3.0L of NS bolus in ED, followed by 100 cc/h -please call Dr. Sharol Given in AM if he  is on-call.  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 Patient is taking Lantus and glipizide at home. Blood sugar 497, bicarbonate 27, anion gap 16. Elevated anion gap is likely due to elevated lactate, rather than DKA since bicarbonate is normal. -will continue home dose of Lantus 20 units daily -SSI  Tobacco abuse: -Did counseling about importance of quitting smoking -Nicotine patch  Hypokalemia: K= 3.0  on admission. - Repleted - Check Mg level  HTN: -continue lisinopril  DVT ppx: SCD Code Status: Full code Family Communication: Yes, patient's  Wife at bed side Disposition Plan:  Anticipate discharge back to previous home environment Consults called:  none Admission status:  Inpatient/tele      Date of Service 12/13/2016    Zoraida Havrilla, [facility] Pager [phone]  If 7PM-7AM, please contact night-coverage [email or web address] Password [email or web address] 12/13/2016, 5:27 AM

## 2016-12-13 NOTE — Progress Notes (Signed)
Initial Nutrition Assessment  DOCUMENTATION CODES:   Not applicable  INTERVENTION:   -MVI daily  NUTRITION DIAGNOSIS:   Increased nutrient needs related to wound healing as evidenced by estimated needs.  GOAL:   Patient will meet greater than or equal to 90% of their needs  MONITOR:   PO intake, Supplement acceptance, Diet advancement, Labs, Weight trends, Skin, I & O's  REASON FOR ASSESSMENT:   Consult Wound healing  ASSESSMENT:   Derrick [name] is a 45 y.o. male with medical history significant of hypertension, diabetes mellitus, peripheral neuropathy, tobacco abuse, prostatitis, s/p of L BKA and R toe amputation, who presents with a right foot lesion and infection.  Pt admitted with rt DM foot infection. Per chart review, potential for necrotizing fasciitis.   Spoke with pt at bedside, who was overall uninterested in speaking with RD. Pt kept his eyes closed during interview and made minimal eye contact. Once RD started questioning pt about home DM regimen, pt answered all questions with "no", including taking medications and having a PCP. Pt shares that his blood sugars "are pretty good" at home, but then admitted to this RD that he does not self-test.   Pt reports he generally consumes 2 meals per day- examples of meals include cheeseburgers and french fries. Noted subway bag and regular sprite on pt bed. He denies any weight loss.   Nutrition-Focused physical exam completed. Findings are no fat depletion, no muscle depletion, and no edema.   Per DM coordinator note, pt is amenable to SNF placement.  Labs reviewed: CBGS: 294-305.  Diet Order:  Diet NPO time specified Except for: Sips with Meds, Ice Chips  Skin:  Wound (see comment) (rt DM foot ulcer)  Last BM:  12/12/16  Height:   Ht Readings from Last 1 Encounters:  12/13/16 6\' 1"  (1.854 m)    Weight:   Wt Readings from Last 1 Encounters:  12/13/16 200 lb (90.7 kg)    Ideal Body Weight:  78.2  kg  BMI:  Body mass index is 26.39 kg/m.  Estimated Nutritional Needs:   Kcal:  1950-2150  Protein:  105-120 grams  Fluid:  >1.9 L  EDUCATION NEEDS:   No education needs identified at this time  Derrick [name], RD, LDN, CDE Pager: [phone] After hours Pager: [phone]

## 2016-12-13 NOTE — Progress Notes (Signed)
Patient requested to go downstairs with family. No ordered in chart to leave floor. Paged on call physician K. Schorr. Per Schorr, patient is not allowed to leave floor and if he does he will be discharged as leaving against medical advice. Patient aware. Will continue to monitor.

## 2016-12-13 NOTE — Progress Notes (Signed)
Pharmacy Antibiotic Note  Derrick RoseBobby L [name] is a 45 y.o. male admitted on 12/13/2016 with Diabetic foot infection - suspected osteomyelitis and necrotizing fasciitis.  Pharmacy has been consulted for Zosyn dosing. Also receiving Clindamycin as necrotizing fasciitis is a possibility.  Noted pt with Vancomycin allergy listed as acute kidney injury - likely not true allergy. But MD will error on side of caution - discussed with ID will start Zyvox.    Plan: Zyvox 600mg  IV q12 Zosyn 3.375gm IV now over 30 min then 3.375gm IV q8h - subsequent doses over 4 hours Clindamycin to 900mg  IV q8h Will f/u micro data, renal function, and pt's clinical condition F/u ID consult for continuation of Zyvox vs retrial with Vancomycin Height: 6\' 1"  (185.4 cm) (from 10/24/16) Weight: 200 lb (90.7 kg) (10/24/16) IBW/kg (Calculated) : 79.9  Temp (24hrs), Avg:98.2 F (36.8 C), Min:97.9 F (36.6 C), Max:98.5 F (36.9 C)   Recent Labs Lab 12/13/16 0021 12/13/16 0035  WBC 29.2*  --   CREATININE 1.07  --   LATICACIDVEN  --  3.24*    Estimated Creatinine Clearance: 99.6 mL/min (by C-G formula based on SCr of 1.07 mg/dL).    Allergies  Allergen Reactions  . Vancomycin Other (See Comments)    Acute kidney injury    Antimicrobials this admission: 12/27 Clinda >>  12/27 Zosyn >>  12/27 Zyvox>  Dose adjustments this admission: n/a  Microbiology results: 12/27 BCx x2:   Leota SauersLisa Debie Ashline Pharm.D. CPP, BCPS Clinical Pharmacist [phone] 12/13/2016 6:03 PM

## 2016-12-13 NOTE — Progress Notes (Addendum)
PROGRESS NOTE  Derrick [name]  MIW:[id number] DOB: [birth year]  DOA: 12/13/2016 PCP: Pcp Not In System   Brief Narrative:  45 y.o. male with medical history significant of hypertension, diabetes mellitus, peripheral neuropathy, tobacco abuse, prostatitis, s/p of L BKA and R toe amputation, who presents with a right foot lesion and infection. Patient has chronic right foot diabetic lesion, which has worsened in the past 6 days. Initially noticed blisters that popped open and had foul smelling purulent drain. Pt states that his right foot "has a hole in it."  He had fever of 103.5 and at one time point at home, and chills. Patient was admitted for sepsis secondary to diabetic foot infection.   Assessment & Plan:   Principal Problem:   Diabetic foot infection ([place]) Active Problems:   Diabetes mellitus ([place])   Tobacco abuse   HTN (hypertension)   Sepsis ([place])   Hypokalemia   1. Sepsis secondary to diabetic right foot infection: Patient met sepsis criteria on admission. X-ray of the right foot showed possible necrotizing fasciitis and cannot rule out osteomyelitis. [facility] for evaluation today. Discussed with Dr. Sharol Given also who indicated that he will see the patient early morning on 12/28 (patient prefers Dr. Sharol Given). Continue empirically started IV Zosyn and clindamycin. Also continue linezolid. He is allergic to vancomycin. He is insensate and has no pain or sensations in that foot. Follow-up MRI. 2. 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 Repeat. Start Lantus 20 units now and daily at bedtime and change NovoLog SSI to moderate with bedtime scale. Monitor closely. 3. Tobacco abuse: Cessation counseled. Patient declines nicotine patch but wants to go out of the floor to smoke and advised him that he is not allowed to do so. 4. Hypokalemia: Replaced on admission. Magnesium to be checked. Follow BMP. 5. Essential hypertension: Controlled. Continue  lisinopril. 6. Leukocytosis: Secondary to sepsis. Follow CBCs. 7. Elevated lactate: Secondary to sepsis. IV fluids, treatment as above and trend lactate.  8. Medical noncompliance: As per nursing report, patient was very abusive with transport staff, MRI staff and nursing. Met with patient along with charge nurse, assistant nursing director, patient's nurse and advised him that he has to treat everyone with respect. He verbalized understanding. Also patient has been refusing labs and telemetry.    DVT prophylaxis: SCDs Code Status: Full Family Communication: Discussed with patient's son at bedside Disposition Plan: Possible DC to SNF when medically stable.   Consultants:   Orthopedics  Procedures:   None  Antimicrobials:   IV Zosyn, clindamycin, linezolid    Subjective: Denies pain in the right foot. Denies any other complaints. Asking to go out to smoke.  Objective:  Vitals:   12/13/16 0415 12/13/16 0501 12/13/16 1410 12/13/16 1649  BP: 115/64 136/75 122/61   Pulse: 100 102 97   Resp:  18 17   Temp:  98.5 F (36.9 C) 97.9 F (36.6 C)   TempSrc:   Oral   SpO2: 99% 97% 97%   Weight:    90.7 kg (200 lb)  Height:    _0  (1.854 m)    Intake/Output Summary (Last 24 hours) at 12/13/16 1728 Last data filed at 12/13/16 0950  Gross per 24 hour  Intake             1000 ml  Output              500 ml  Net  500 ml   Filed Weights   12/13/16 1649  Weight: 90.7 kg (200 lb)    Examination:  General exam: Pleasant young male lying comfortably propped up in bed. Son at bedside. Respiratory system: Clear to auscultation. Respiratory effort normal. Cardiovascular system: S1 & S2 heard, RRR. No JVD, murmurs, rubs, gallops or clicks. No pedal edema.  Gastrointestinal system: Abdomen is nondistended, soft and nontender. No organomegaly or masses felt. Normal bowel sounds heard. Central nervous system: Alert and oriented. No focal neurological  deficits. Extremities: Symmetric 5 x 5 power. Left BKA healed stump. Skin: pt has three lesion in right foot. Under medial malleolus, 4x5 cm necrotic wound draining pus and foul smelling with surrounding erythema and induration. Under lateral malleolus, 1x1 cm necrotic ulcer that is draining pus. 5x5cm open diabetic ulcer with purulent draining over bottom of foot. No crepitus appreciated.  Multiple amputated toes over right foot. Psychiatry: Judgement and insight appear normal. Mood & affect appropriate.     Data Reviewed: I have personally reviewed following labs and imaging studies  CBC:  Recent Labs Lab 12/13/16 0021  WBC 29.2*  NEUTROABS 23.3*  HGB 13.2  HCT 38.8*  MCV 78.9  PLT 161*   Basic Metabolic Panel:  Recent Labs Lab 12/13/16 0021  NA 131*  K 3.0*  CL 88*  CO2 27  GLUCOSE 497*  BUN 6  CREATININE 1.07  CALCIUM 8.6*   GFR: Estimated Creatinine Clearance: 99.6 mL/min (by C-G formula based on SCr of 1.07 mg/dL). Liver Function Tests:  Recent Labs Lab 12/13/16 0021  AST 32  ALT 19  ALKPHOS 256*  BILITOT 0.5  PROT 8.9*  ALBUMIN 2.3*   No results for input(s): LIPASE, AMYLASE in the last 168 hours. No results for input(s): AMMONIA in the last 168 hours. Coagulation Profile: No results for input(s): INR, PROTIME in the last 168 hours. Cardiac Enzymes: No results for input(s): CKTOTAL, CKMB, CKMBINDEX, TROPONINI in the last 168 hours. BNP (last 3 results) No results for input(s): PROBNP in the last 8760 hours. HbA1C: No results for input(s): HGBA1C in the last 72 hours. CBG:  Recent Labs Lab 12/13/16 0616 12/13/16 1101 12/13/16 1653  GLUCAP 305* 294* 337*   Lipid Profile: No results for input(s): CHOL, HDL, LDLCALC, TRIG, CHOLHDL, LDLDIRECT in the last 72 hours. Thyroid Function Tests: No results for input(s): TSH, T4TOTAL, FREET4, T3FREE, THYROIDAB in the last 72 hours. Anemia Panel: No results for input(s): VITAMINB12, FOLATE, FERRITIN,  TIBC, IRON, RETICCTPCT in the last 72 hours.  Sepsis Labs:  Recent Labs Lab 12/13/16 0035  LATICACIDVEN 3.24*    No results found for this or any previous visit (from the past 240 hour(s)).       Radiology Studies: Dg Foot Complete Right  Result Date: 12/13/2016 CLINICAL DATA:  Acute onset of lateral right foot and heel discoloration and pain. Initial encounter. EXAM: RIGHT FOOT COMPLETE - 3+ VIEW COMPARISON:  Right foot radiographs performed 09/17/2012 FINDINGS: Diffuse soft tissue air is seen tracking about the hindfoot and midfoot, extending about the heel, highly suspicious for necrotizing fasciitis. Diffuse soft tissue swelling is noted. Mild soft tissue air extends into the remaining forefoot. A small amount of air is noted at the posterior lower leg. The patient is status post amputation along the metatarsals. There is diffuse degenerative change about the midfoot. Underlying osteomyelitis cannot be excluded. There is chronic partial resorption of the cuboid. IMPRESSION: 1. Diffuse soft tissue air tracking about the hindfoot and midfoot, extending about  the heel, highly suspicious for necrotizing fasciitis. Mild soft tissue air extends into the remaining forefoot. Small amount of air noted at the posterior lower leg. 2. New diffuse degenerative change about the midfoot. Underlying osteomyelitis cannot be excluded. Chronic partial resorption of the cuboid. These results were called by telephone at the time of interpretation on 12/13/2016 at 1:11 am to Dr. Brantley Stage, who verbally acknowledged these results. Electronically Signed   By: Garald Balding M.D.   On: 12/13/2016 01:13        Scheduled Meds: . clindamycin (CLEOCIN) IV  900 mg Intravenous Q8H  . insulin aspart  0-9 Units Subcutaneous Q4H  . insulin glargine  20 Units Subcutaneous QHS  . lisinopril  10 mg Oral Daily  . multivitamin with minerals  1 tablet Oral Daily  . nicotine  21 mg Transdermal Daily  .  piperacillin-tazobactam  3.375 g Intravenous STAT  . piperacillin-tazobactam (ZOSYN)  IV  3.375 g Intravenous Q8H  . potassium chloride  40 mEq Oral Once  . sodium chloride  1,000 mL Intravenous Once  . sodium chloride  1,000 mL Intravenous Once  . sodium chloride flush  3 mL Intravenous Q12H   Continuous Infusions: . sodium chloride    . sodium chloride 100 mL/hr at 12/13/16 0458     LOS: 0 days       [facility] MD [facility] Pager [phone]  If 7PM-7AM, please contact night-coverage [email or web address] Password [facility] 12/13/2016, 5:28 PM

## 2016-12-13 NOTE — ED Notes (Signed)
Provider at bedside

## 2016-12-13 NOTE — ED Notes (Signed)
Pt states he does not want any asian drs and will tell them to leave if they come in. Pt initially refused to let this RN start an IV and get blood work states "I told them out front to get all they need, they aint getting more" After a long discussion about the need for ABX pt agreed to let this RN start an IV, one set of blood CX was obtained at this time. Pt refuses to be stuck additionally for more Lab tech was turned away. EDP and Charge RN made aware of pts requests

## 2016-12-13 NOTE — Progress Notes (Signed)
Pharmacy Antibiotic Note  Derrick [name] is a 45 y.o. male admitted on 12/13/2016 with Diabetic foot infection - suspected osteomyelitis and necrotizing fasciitis.  Pharmacy has been consulted for Zosyn dosing. Also receiving Clindamycin as necrotizing fasciitis is a possibility.  Noted pt with Vancomycin allergy listed as acute kidney injury - likely not true allergy. Suggested retrial with Vancomycin with careful renal function monitoring. Dr. Clyde LundborgNiu giving one dose of Zyvox then consulting ID in a.m.  Plan: Zosyn 3.375gm IV now over 30 min then 3.375gm IV q8h - subsequent doses over 4 hours Increase Clindamycin to 900mg  IV q8h Will f/u micro data, renal function, and pt's clinical condition F/u ID consult for continuation of Zyvox vs retrial with Vancomycin    Temp (24hrs), Avg:98.2 F (36.8 C), Min:98.2 F (36.8 C), Max:98.2 F (36.8 C)   Recent Labs Lab 12/13/16 0021 12/13/16 0035  WBC 29.2*  --   CREATININE 1.07  --   LATICACIDVEN  --  3.24*    CrCl cannot be calculated (Unknown ideal weight.).    Allergies  Allergen Reactions  . Vancomycin Other (See Comments)    Acute kidney injury    Antimicrobials this admission: 12/27 Clinda >>  12/27 Zosyn >>  12/27 Zyvox x1  Dose adjustments this admission: n/a  Microbiology results: 12/27 BCx x2:   Thank you for allowing pharmacy to be a part of this patient's care.  Derrick [name], PharmD, BCPS Clinical pharmacist, pager [phone] 12/13/2016 3:06 AM

## 2016-12-13 NOTE — Care Management Note (Signed)
Case Management Note  Patient Details  Name: Derrick [name] MRN: [id number] Date of Birth: 10/28/1971  Subjective/Objective:                 45 y.o. male with medical history significant of hypertension, diabetes mellitus, peripheral neuropathy, tobacco abuse, prostatitis, s/p of L BKA and R toe amputation, who presents with a right foot lesion and infection. Diffuse soft tissue air is seen tracking about the hindfoot and midfoot, extending about the heel. Mild soft tissue air extends into the remaining forefoot. A small amount of air is noted at the posterior lower leg, osteomyelitis cannot be excluded; highly suspicious for necrotizing fasciitis.   From home with wife. Ortho consulting in AM.    Action/Plan:   Expected Discharge Date:                  Expected Discharge Plan:  Home w Home Health Services  In-House Referral:     Discharge planning Services  CM Consult  Post Acute Care Choice:    Choice offered to:     DME Arranged:    DME Agency:     HH Arranged:    HH Agency:     Status of Service:  In process, will continue to follow  If discussed at Long Length of Stay Meetings, dates discussed:    Additional Comments:  Lawerance SabalDebbie Naarah Borgerding, RN 12/13/2016, 4:39 PM

## 2016-12-13 NOTE — Progress Notes (Addendum)
Orthopedic surgeon on call for Dr. Lajoyce [name] in [facility]. Requesting physician Dr. Waymon [name]. Chief complaint: Right foot draining ulcers with radiographic findings of soft tissue gas.  History of present illness: This patient is a 45 year old male with history of diabetes mellitus2 with peripheral neuropathy tobacco abuse and hypertension. He has undergone previous left below-knee amputation revised by Dr. Aldean BakerMarcus [name] in July 2017. He has been followed by medical doctors and [facility]. And has undergone previous surgeries including right foot transmetatarsal amputation in February 2017 and a left-sided low knee amputation in April 2017. Last seen by Dr. Lajoyce [name] in November 2017 with ulcers underlying the right foot. At that time was started on doxycycline and Dr. Lajoyce [name] had indicated to Derrick [name] that he likely would eventually require a right below-knee amputation. Derrick [name] presented to [facility] 12/07/2016 and apparently was briefly admitted for problems of right foot infection and sepsis with a fever of 104.8 a diabetic ulcer on the bottom foot and elevated blood sugar of 522. He apparently left AGAINST MEDICAL ADVICE refusing IV blood draws for blood cultures.Present[name] with worsening malodorous sdrainage from the right foot ulcer.  Past medical history: High blood pressure, diabetes with diabetic peripheral neuropathy. History of numerous episodes of cellulitis is of the mouth gluteal abscess cellulitis and facial side cellulitis. Poor compliance with medical treatment of diabetes mellitus as well as lower extremity ulcers and abscesses.Past history of MRSA.  Past surgical history right transmetatarsal amputation February 2017, left below-knee amputation April 2017 revised in July 2017. History of back surgery. History of hernia repair. History of previous left shoulder arthroscopy biceps tenotomy and capsular release.  Social  history smokes daily 2 packs of cigarettes for the last 25 years. Does not drink alcohol.:  Allergies reaction to vancomycin with acute kidney injuries.  Home medications: Lipitor 10 mg a day glipizide 10 mg twice a day with meals. Lisinopril 10 mg tablet daily.   Vital signs are stable. 97.9, heart rate 97, respiration 17, blood pressure 122/60, O2 saturation 97%. Weight is 90.7 kg  Awake alert oriented 4 requesting the permission [facility]. Notes that he was told it was my choice. He is lying on the patient's bed in no acute distress. He is able to move the left hip and left knee without difficulty. There is a left below-knee amputation with a well-healed below-the-knee residual limb. The right leg shows dressing about the right foot and right transmetatarsal amputation site that is well-healed. There is drainage from ulcers underlying right plantar lateral aspect of the foot with blisters present and are draining purulent material. There is a bulla forming over the medial posterior calcaneus and distal Achilles tendon. Drainage is malodorous Creamy brown tan exudate. An new dressing with multiple ABDs was applied. There is some tenderness in the distal posterior aspect of the foot. There is no erythema about the right leg or over the right posterior calf proximally or distally. No palpable crepitus of skin noted.  Plain radiographs from the patient's admission early this morning show areas of subcutaneous collections gas on the right anterior lateral mid foot and over the right heel. There is one small area just noted the standard distal Achilles area.  Laboratory tests WBC 20 9.2K with a left shift noted. Hemoglobin 13 hematocrit 38.8 platelet count 578. Sodium 131, potassium 3.0, BUN 6 creatinine are1.07. Glucose at the time of admission 497. Lactic acid  3.24.  Assessment and plan: Patient is a poorly compliant 45 year old male with diabetes and  peripheral neuropathy with problems of bilateral lower extremity changes due to ulcers down with left below-knee amputation that is well-healed. He has right Charcot midfoot amputation site that has current findings of infection of soft tissue and likely deep infection Charcot foot. Likelihood of being able to salvage the right foot is extremely low. An infected Charcot foot in is an indication for right below-knee amputation. He has not maintained an nothing by mouth status and indicates that his preference is for Dr. Lajoyce [name] to perform right below-knee amputation. As he was able to heal the right transmetatarsal amputation site I believe he should be capable of healing a right knee below knee amputation..  Plan: We'll plan to make the patient nothing by mouth past midnight discuss with Dr. Lajoyce [name] the findings regarding his right foot and lower extremity. This patient's vital signs stable with minimal thermia so that maintain his current IV medications would recommend for right below-knee amputation to be done tomorrow.

## 2016-12-13 NOTE — Progress Notes (Addendum)
Inpatient Diabetes Program Recommendations  AACE/ADA: New Consensus Statement on Inpatient Glycemic Control (2015)  Target Ranges:  Prepandial:   less than 140 mg/dL      Peak postprandial:   less than 180 mg/dL (1-2 hours)      Critically ill patients:  140 - 180 mg/dL   Lab Results  Component Value Date   GLUCAP 294 (H) 12/13/2016   HGBA1C 8.8 (H) 05/31/2012   HGBA1C = 12.8% on 05/23/2016 (average CBG's are >300)  Results for Derrick [name], Derrick [name] (MRN [id number]) as of 12/13/2016 11:45  Ref. Range 12/13/2016 06:16 12/13/2016 11:01  Glucose-Capillary Latest Ref Range: 65 - 99 mg/dL 782305 (H) 956294 (H)   Review of Glycemic Control  Diabetes history:     DM, DM foot ulcer (necrotic area) Outpatient Diabetes medications:     None (refusing to take "until he gets a PCP", though patient has not made any efforts to find a PCP) Current orders for Inpatient glycemic control:    NPO    Lantus 20 units QHS    Novolog 0-9 units Q4H  Inpatient Diabetes Program Recommendations:    This RN spoke with patient at bedside regarding home care management and, based on this discussion, agrees with placement in SNF.  Because of an A1C of 12.8 coupled with patient's noncompliance with home medical management, this RN is concerned that patient's wounds will not heal unless patient is receiving 24 hour/day cares.    Per ADA recommendations "consider performing an A1C on all patients with diabetes or hyperglycemia admitted to the hospital if not performed in the prior 3 months".  If the A1C is not improved, this value would reinforce SNF placement to promote wound healing.  Thank you,  Kristine LineaKaren Alyah Boehning, RN, MSN Diabetes Coordinator Inpatient Diabetes Program [phone] (Team Pager)

## 2016-12-13 NOTE — ED Triage Notes (Addendum)
The pt is c/o rt foot pain for one week.  He has had his toes amputated .Marland Kitchen.  The foot has a foul odor  He has had a temp also.  He has a  Lt bk on the lt.

## 2016-12-13 NOTE — ED Notes (Signed)
Nurse starting IV and will try to get 1st set of blood cultures.

## 2016-12-13 NOTE — Consult Note (Addendum)
WOC consult requested for right foot wound.  X-ray results indicate: Diffuse soft tissue air is seen tracking about the hindfoot and midfoot, extending about the heel. Mild soft tissue air extends into the remaining forefoot. A small amount of air is noted at the posterior lower leg, osteomyelitis cannot be excluded; highly suspicious for necrotizing fasciitis. This complex medical condition is beyond WOC scope of practice.  Pt has requested Dr Lajoyce Cornersuda and consult is pending, according to the EMR.  Please refer to ortho service for further plan of care. Please re-consult if further assistance is needed.  Thank-you,  Cammie Mcgeeawn Nianna Igo MSN, RN, CWOCN, GilbertWCN-AP, CNS [phone]

## 2016-12-13 NOTE — ED Provider Notes (Signed)
[facility]-EMERGENCY DEPT Provider Note   CSN: [id number] Arrival date & time: 12/13/16  0012  By signing my name below, I, Teofilo PodMatthew P. Jamison, attest that this documentation has been prepared under the direction and in the presence of Lavera Guiseana Duo Liu, MD . Electronically Signed: Teofilo PodMatthew P. Jamison, ED Scribe. 12/13/2016. 2:11 AM.   History   Chief Complaint Chief Complaint  Patient presents with  . Foot Pain   The history is provided by the patient. No language interpreter was used.   HPI Comments:  Derrick [name] is a 45 y.o. male with PMHx of DM and neuropathy who presents to the Emergency Department, here due to worsening wounds to his right foot x 5 days. Initially noticed blisters that popped open and had foul smelling purulent drainage. 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  Pt states that his right foot "has a hole in it." Pt notes that he has no feeling in his right foot due to neuropathy. Pt has had associated fever measured at 103.5, nausea, vomiting. No alleviating factors noted. Pt denies diarrhea, abdominal pain, dysuria.      Past Medical History:  Diagnosis Date  . Diabetes mellitus   . Peripheral neuropathy (HCC)   . Pneumonia   . Prostatitis     Patient Active Problem List   Diagnosis Date Noted  . Diabetic foot infection (HCC) 12/13/2016  . Chronic osteomyelitis involving ankle and foot (HCC) 06/03/2012  . Syncope and collapse 04/12/2012  . Tobacco abuse 04/12/2012  . Cellulitis 02/25/2012  . Diabetic toe ulcer (HCC) 02/25/2012  . Diabetes mellitus (HCC) 02/25/2012  . Leukocytosis 02/25/2012    Past Surgical History:  Procedure Laterality Date  . AMPUTATION  05/31/2012   Procedure: AMPUTATION DIGIT;  Surgeon: Nadara MustardMarcus V Duda, MD;  [facility];  Service: Orthopedics;  Laterality: Right;  right fourth and fifth toe ray amputation   . I&D EXTREMITY  08/02/2012   Procedure: IRRIGATION AND DEBRIDEMENT EXTREMITY;  Surgeon: Nadara MustardMarcus V  Duda, MD;  Location: [facility] OR;  Service: Orthopedics;  Laterality: Right;  Right Foot Irrigation and Debridement, Place Antibiotic Beads  . SHOULDER SURGERY Bilateral   . STUMP REVISION Left 07/12/2016   Procedure: Revision Left Below Knee Amputation;  Surgeon: Nadara MustardMarcus V Duda, MD;  Location: [facility] OR;  Service: Orthopedics;  Laterality: Left;  . TOE AMPUTATION     multiple toes amputated.       Home Medications    Prior to Admission medications   Medication Sig Start Date End Date Taking? Authorizing Provider  doxycycline (VIBRA-TABS) 100 MG tablet Take 1 tablet (100 mg total) by mouth 2 (two) times daily. 10/24/16   Nadara MustardMarcus V Duda, MD  glipiZIDE (GLUCOTROL) 10 MG tablet Take 10 mg by mouth 2 (two) times daily. 05/23/16 05/23/17  Historical Provider, MD  insulin glargine (LANTUS) 100 UNIT/ML injection Inject 20 Units into the skin at bedtime.  06/02/16 06/02/17  Historical Provider, MD  lisinopril (PRINIVIL,ZESTRIL) 10 MG tablet Take 10 mg by mouth daily.  05/25/16 05/25/17  Historical Provider, MD  oxyCODONE-acetaminophen (PERCOCET) 10-325 MG tablet Take 1 tablet by mouth every 4 (four) hours as needed for pain. 07/12/16   Nadara MustardMarcus V Duda, MD    Family History Family History  Problem Relation Age of Onset  . Cancer Mother   . Congestive Heart Failure Father     Social History Social History  Substance Use Topics  . Smoking status: Current Every Day Smoker    Packs/day:  2.00    Years: 24.00    Types: Cigarettes  . Smokeless tobacco: Never Used  . Alcohol use No     Allergies   Vancomycin   Review of Systems Review of Systems 10 Systems reviewed and are negative for acute change except as noted in the HPI.   Physical Exam Updated Vital Signs BP 128/75   Pulse 109   Temp 98.2 F (36.8 C) (Oral)   Resp 20   SpO2 95%   Physical Exam Physical Exam  Nursing note and vitals reviewed. Constitutional: non-toxic, and in no acute distress Head: Normocephalic and atraumatic.    Mouth/Throat: Oropharynx is clear and moist.  Neck: Normal range of motion. Neck supple.  Cardiovascular: Tachycardic rate and regular rhythm.   Pulmonary/Chest: Effort normal and breath sounds normal.  Abdominal: Soft. There is no tenderness. There is no rebound and no guarding.  Musculoskeletal: Normal range of motion.  Neurological: Alert, no facial droop, fluent speech, moves all extremities symmetrically Skin: Right foot: Under medial malleolus, 4x5cm necrotic wound draining pus and foul smelling with surrounding erythema and induration. Under lateral malleolus, 1x1cm necrotic ulcer that is draining pus. 5x5cm open diabetic ulcer with purulent draining over the heel.  Psychiatric: Cooperative   ED Treatments / Results  DIAGNOSTIC STUDIES:  Oxygen Saturation is 100% on RA, normal by my interpretation.    COORDINATION OF CARE:  2:11 AM Discussed treatment plan with pt at bedside and pt agreed to plan.   Labs (all labs ordered are listed, but only abnormal results are displayed) Labs Reviewed  CBC WITH DIFFERENTIAL/PLATELET - Abnormal; Notable for the following:       Result Value   WBC 29.2 (*)    HCT 38.8 (*)    Platelets 578 (*)    Neutro Abs 23.3 (*)    Monocytes Absolute 1.8 (*)    All other components within normal limits  COMPREHENSIVE METABOLIC PANEL - Abnormal; Notable for the following:    Sodium 131 (*)    Potassium 3.0 (*)    Chloride 88 (*)    Glucose, Bld 497 (*)    Calcium 8.6 (*)    Total Protein 8.9 (*)    Albumin 2.3 (*)    Alkaline Phosphatase 256 (*)    Anion gap 16 (*)    All other components within normal limits  I-STAT CG4 LACTIC ACID, ED - Abnormal; Notable for the following:    Lactic Acid, Venous 3.24 (*)    All other components within normal limits  CULTURE, BLOOD (ROUTINE X 2)  CULTURE, BLOOD (ROUTINE X 2)  URINALYSIS, ROUTINE W REFLEX MICROSCOPIC    EKG  EKG Interpretation None       Radiology Dg Foot Complete Right  Result  Date: 12/13/2016 CLINICAL DATA:  Acute onset of lateral right foot and heel discoloration and pain. Initial encounter. EXAM: RIGHT FOOT COMPLETE - 3+ VIEW COMPARISON:  Right foot radiographs performed 09/17/2012 FINDINGS: Diffuse soft tissue air is seen tracking about the hindfoot and midfoot, extending about the heel, highly suspicious for necrotizing fasciitis. Diffuse soft tissue swelling is noted. Mild soft tissue air extends into the remaining forefoot. A small amount of air is noted at the posterior lower leg. The patient is status post amputation along the metatarsals. There is diffuse degenerative change about the midfoot. Underlying osteomyelitis cannot be excluded. There is chronic partial resorption of the cuboid. IMPRESSION: 1. Diffuse soft tissue air tracking about the hindfoot and midfoot, extending about the heel,  highly suspicious for necrotizing fasciitis. Mild soft tissue air extends into the remaining forefoot. Small amount of air noted at the posterior lower leg. 2. New diffuse degenerative change about the midfoot. Underlying osteomyelitis cannot be excluded. Chronic partial resorption of the cuboid. These results were called by telephone at the time of interpretation on 12/13/2016 at 1:11 am to Dr. Crista CurbANA LIU, who verbally acknowledged these results. Electronically Signed   By: Roanna RaiderJeffery  Chang M.D.   On: 12/13/2016 01:13    Procedures Procedures (including critical care time) CRITICAL CARE Performed by: Lavera Guiseana Duo Liu   Total critical care time: 35 minutes  Critical care time was exclusive of separately billable procedures and treating other patients.  Critical care was necessary to treat or prevent imminent or life-threatening deterioration.  Critical care was time spent personally by me on the following activities: development of treatment plan with patient and/or surrogate as well as nursing, discussions with consultants, evaluation of patient's response to treatment, examination  of patient, obtaining history from patient or surrogate, ordering and performing treatments and interventions, ordering and review of laboratory studies, ordering and review of radiographic studies, pulse oximetry and re-evaluation of patient's condition.  Medications Ordered in ED Medications  sodium chloride 0.9 % bolus 1,000 mL (not administered)  sodium chloride 0.9 % bolus 1,000 mL (not administered)  sodium chloride 0.9 % bolus 1,000 mL (1,000 mLs Intravenous New Bag/Given 12/13/16 0219)  HYDROmorphone (DILAUDID) injection 1 mg (1 mg Intravenous Given 12/13/16 0235)     Initial Impression / Assessment and Plan / ED Course  I have reviewed the triage vital signs and the nursing notes.  Pertinent labs & imaging results that were available during my care of the patient were reviewed by me and considered in my medical decision making (see chart for details).  Clinical Course     Presenting With sepsis secondary to infected diabetic foot ulcers. Afebrile, but tachycardic. Normotensive in no respiratory distress. Not toxic in appearance. With evidence of severe diabetic ulcers that are infected in the right foot. With leukocytosis of 29 and elevated lactate of 3.4. Also with hyperglycemia but without evidence of DKA. I received 3 L of IV fluids per sepsis protocol. Started on broad-spectrum antibiotics. Discussed with Dr. Clyde LundborgNiu who will admit for ongoing management.    Final Clinical Impressions(s) / ED Diagnoses   Final diagnoses:  Sepsis, due to unspecified organism [facility](HCC)  Diabetic foot infection (HCC)    New Prescriptions New Prescriptions   No medications on file   I personally performed the services described in this documentation, which was scribed in my presence. The recorded information has been reviewed and is accurate.    Lavera Guiseana Duo Liu, MD 12/13/16 [phone]

## 2016-12-13 NOTE — Progress Notes (Signed)
This patient arrived to the floor approximately 3am from the ED with a R foot diabetic wound. Wound is on the bottom of foot, heel, Right lateral , looks like a blister with black fluid filled pus.  There is odor. Patient is refusing telemetry, any labs, any more IV placed. Is vulgar and rude to staff. He was reluctant to have normal saline running at 100cc per hour.  Has a hoverround, which he states "I will ride around on it. I am not staying in this bed" I gave report to the oncoming nurse of this. I redressed his wound with the exception of the ace bandage, which has been ordered. Patient was accompanied by his wife and other family members were present. He is alert and oriented. Name band verified. He is currently NPO except ice chips and sips with meds. IV site intact on R Hand. Safety maintained with family present.

## 2016-12-14 ENCOUNTER — Encounter: Payer: Medicare Other | Admitting: Physical Therapy

## 2016-12-14 ENCOUNTER — Inpatient Hospital Stay (HOSPITAL_COMMUNITY): Payer: Medicare Other | Admitting: Anesthesiology

## 2016-12-14 ENCOUNTER — Encounter (HOSPITAL_COMMUNITY): Payer: Self-pay | Admitting: Anesthesiology

## 2016-12-14 ENCOUNTER — Encounter (HOSPITAL_COMMUNITY)
Admission: EM | Disposition: A | Discharge status: Home / Self Care | Payer: Self-pay | Source: Home / Self Care | Attending: Internal Medicine

## 2016-12-14 ENCOUNTER — Encounter (HOSPITAL_COMMUNITY): Payer: Medicare Other

## 2016-12-14 DIAGNOSIS — E1152 Type 2 diabetes mellitus with diabetic peripheral angiopathy with gangrene: Secondary | ICD-10-CM

## 2016-12-14 DIAGNOSIS — L089 Local infection of the skin and subcutaneous tissue, unspecified: Secondary | ICD-10-CM

## 2016-12-14 DIAGNOSIS — A419 Sepsis, unspecified organism: Principal | ICD-10-CM

## 2016-12-14 DIAGNOSIS — E876 Hypokalemia: Secondary | ICD-10-CM

## 2016-12-14 DIAGNOSIS — E1169 Type 2 diabetes mellitus with other specified complication: Secondary | ICD-10-CM

## 2016-12-14 HISTORY — PX: AMPUTATION: SHX166

## 2016-12-14 LAB — GLUCOSE, CAPILLARY
GLUCOSE-CAPILLARY: 265 mg/dL — AB (ref 65–99)
GLUCOSE-CAPILLARY: 257 mg/dL — AB (ref 65–99)
Glucose-Capillary: 373 mg/dL — ABNORMAL HIGH (ref 65–99)
Glucose-Capillary: 315 mg/dL — ABNORMAL HIGH (ref 65–99)

## 2016-12-14 LAB — SURGICAL PCR SCREEN
MRSA, PCR: NEGATIVE
Staphylococcus aureus: NEGATIVE

## 2016-12-14 LAB — HEMOGLOBIN A1C
HEMOGLOBIN A1C: 14.5 % — AB (ref 4.8–5.6)
Mean Plasma Glucose: 369 mg/dL

## 2016-12-14 SURGERY — AMPUTATION BELOW KNEE
Anesthesia: General | Site: Leg Lower | Laterality: Right

## 2016-12-14 MED ORDER — ONDANSETRON HCL 4 MG/2ML IJ SOLN: 4.0000 mg | Freq: Four times a day (QID) | INTRAMUSCULAR | Status: DC | PRN

## 2016-12-14 MED ORDER — SODIUM CHLORIDE 0.9 % IV SOLN: INTRAVENOUS | Status: DC

## 2016-12-14 MED ORDER — PROPOFOL 10 MG/ML IV BOLUS
INTRAVENOUS | Status: DC | PRN
  Administered 2016-12-14: 180 mg via INTRAVENOUS

## 2016-12-14 MED ORDER — METHOCARBAMOL 1000 MG/10ML IJ SOLN
500.0000 mg | Freq: Four times a day (QID) | INTRAVENOUS | Status: DC | PRN
  Filled 2016-12-14: qty 5

## 2016-12-14 MED ORDER — MIDAZOLAM HCL 5 MG/5ML IJ SOLN
INTRAMUSCULAR | Status: DC | PRN
  Administered 2016-12-14: 2 mg via INTRAVENOUS

## 2016-12-14 MED ORDER — TRANEXAMIC ACID 1000 MG/10ML IV SOLN
INTRAVENOUS | Status: DC | PRN
  Administered 2016-12-14: 2000 mg via TOPICAL

## 2016-12-14 MED ORDER — METOCLOPRAMIDE HCL 5 MG/ML IJ SOLN: 5.0000 mg | Freq: Three times a day (TID) | INTRAMUSCULAR | Status: DC | PRN

## 2016-12-14 MED ORDER — PHENYLEPHRINE HCL 10 MG/ML IJ SOLN
INTRAMUSCULAR | Status: DC | PRN
  Administered 2016-12-14: 120 ug via INTRAVENOUS
  Administered 2016-12-14: 80 ug via INTRAVENOUS
  Administered 2016-12-14: 120 ug via INTRAVENOUS

## 2016-12-14 MED ORDER — PROMETHAZINE HCL 25 MG/ML IJ SOLN: 6.2500 mg | INTRAMUSCULAR | Status: DC | PRN

## 2016-12-14 MED ORDER — ONDANSETRON HCL 4 MG/2ML IJ SOLN
INTRAMUSCULAR | Status: AC
  Filled 2016-12-14: qty 2

## 2016-12-14 MED ORDER — METOCLOPRAMIDE HCL 10 MG PO TABS: 5.0000 mg | ORAL_TABLET | Freq: Three times a day (TID) | ORAL | Status: DC | PRN

## 2016-12-14 MED ORDER — HYDROMORPHONE HCL 1 MG/ML IJ SOLN
INTRAMUSCULAR | Status: AC
  Administered 2016-12-14: 0.5 mg via INTRAVENOUS
  Filled 2016-12-14: qty 0.5

## 2016-12-14 MED ORDER — FENTANYL CITRATE (PF) 100 MCG/2ML IJ SOLN
INTRAMUSCULAR | Status: AC
  Filled 2016-12-14: qty 2

## 2016-12-14 MED ORDER — ONDANSETRON HCL 4 MG PO TABS: 4.0000 mg | ORAL_TABLET | Freq: Four times a day (QID) | ORAL | Status: DC | PRN

## 2016-12-14 MED ORDER — FENTANYL CITRATE (PF) 100 MCG/2ML IJ SOLN
INTRAMUSCULAR | Status: DC | PRN
  Administered 2016-12-14 (×2): 25 ug via INTRAVENOUS

## 2016-12-14 MED ORDER — MIDAZOLAM HCL 2 MG/2ML IJ SOLN
INTRAMUSCULAR | Status: AC
  Filled 2016-12-14: qty 2

## 2016-12-14 MED ORDER — PROPOFOL 10 MG/ML IV BOLUS
INTRAVENOUS | Status: AC
  Filled 2016-12-14: qty 20

## 2016-12-14 MED ORDER — LACTATED RINGERS IV SOLN
INTRAVENOUS | Status: DC | PRN
  Administered 2016-12-14: 17:00:00 via INTRAVENOUS

## 2016-12-14 MED ORDER — METHOCARBAMOL 500 MG PO TABS: 500.0000 mg | ORAL_TABLET | Freq: Four times a day (QID) | ORAL | Status: DC | PRN

## 2016-12-14 MED ORDER — LIDOCAINE 2% (20 MG/ML) 5 ML SYRINGE
INTRAMUSCULAR | Status: AC
  Filled 2016-12-14: qty 5

## 2016-12-14 MED ORDER — HYDROMORPHONE HCL 2 MG/ML IJ SOLN
1.0000 mg | INTRAMUSCULAR | Status: DC | PRN
  Filled 2016-12-14: qty 1

## 2016-12-14 MED ORDER — ACETAMINOPHEN 325 MG PO TABS: 650.0000 mg | ORAL_TABLET | Freq: Four times a day (QID) | ORAL | Status: DC | PRN

## 2016-12-14 MED ORDER — ONDANSETRON HCL 4 MG/2ML IJ SOLN
INTRAMUSCULAR | Status: DC | PRN
  Administered 2016-12-14: 4 mg via INTRAVENOUS

## 2016-12-14 MED ORDER — ACETAMINOPHEN 650 MG RE SUPP: 650.0000 mg | Freq: Four times a day (QID) | RECTAL | Status: DC | PRN

## 2016-12-14 MED ORDER — HYDROMORPHONE HCL 1 MG/ML IJ SOLN
0.2500 mg | INTRAMUSCULAR | Status: DC | PRN
  Administered 2016-12-14 (×2): 0.5 mg via INTRAVENOUS

## 2016-12-14 MED ORDER — 0.9 % SODIUM CHLORIDE (POUR BTL) OPTIME
TOPICAL | Status: DC | PRN
  Administered 2016-12-14 (×2): 1000 mL

## 2016-12-14 MED ORDER — TRANEXAMIC ACID 1000 MG/10ML IV SOLN
2000.0000 mg | Freq: Once | INTRAVENOUS | Status: DC
  Filled 2016-12-14: qty 20

## 2016-12-14 MED ORDER — LIDOCAINE HCL (CARDIAC) 20 MG/ML IV SOLN
INTRAVENOUS | Status: DC | PRN
  Administered 2016-12-14: 80 mg via INTRAVENOUS

## 2016-12-14 SURGICAL SUPPLY — 38 items
BLADE SAW RECIP 87.9 MT (BLADE) ×2 IMPLANT
BLADE SURG 21 STRL SS (BLADE) ×2 IMPLANT
BNDG COHESIVE 6X5 TAN STRL LF (GAUZE/BANDAGES/DRESSINGS) ×3 IMPLANT
BNDG GAUZE ELAST 4 BULKY (GAUZE/BANDAGES/DRESSINGS) ×2 IMPLANT
COVER SURGICAL LIGHT HANDLE (MISCELLANEOUS) ×4 IMPLANT
CUFF TOURNIQUET SINGLE 34IN LL (TOURNIQUET CUFF) ×1 IMPLANT
CUFF TOURNIQUET SINGLE 44IN (TOURNIQUET CUFF) IMPLANT
DRAPE HALF SHEET 40X57 (DRAPES) ×2 IMPLANT
DRAPE INCISE IOBAN 66X45 STRL (DRAPES) ×1 IMPLANT
DRAPE PROXIMA HALF (DRAPES) ×2 IMPLANT
DRAPE U-SHAPE 47X51 STRL (DRAPES) ×2 IMPLANT
DRSG ADAPTIC 3X8 NADH LF (GAUZE/BANDAGES/DRESSINGS) ×1 IMPLANT
DRSG PAD ABDOMINAL 8X10 ST (GAUZE/BANDAGES/DRESSINGS) ×2 IMPLANT
DRSG VAC ATS MED SENSATRAC (GAUZE/BANDAGES/DRESSINGS) ×1 IMPLANT
DURAPREP 26ML APPLICATOR (WOUND CARE) ×2 IMPLANT
ELECT REM PT RETURN 9FT ADLT (ELECTROSURGICAL) ×2
ELECTRODE REM PT RTRN 9FT ADLT (ELECTROSURGICAL) ×1 IMPLANT
GAUZE SPONGE 4X4 12PLY STRL (GAUZE/BANDAGES/DRESSINGS) ×1 IMPLANT
GLOVE BIOGEL PI IND STRL 9 (GLOVE) ×1 IMPLANT
GLOVE BIOGEL PI INDICATOR 9 (GLOVE) ×1
GLOVE SURG ORTHO 9.0 STRL STRW (GLOVE) ×2 IMPLANT
GOWN STRL REUS W/ TWL XL LVL3 (GOWN DISPOSABLE) ×2 IMPLANT
GOWN STRL REUS W/TWL XL LVL3 (GOWN DISPOSABLE) ×4
KIT BASIN OR (CUSTOM PROCEDURE TRAY) ×2 IMPLANT
KIT ROOM TURNOVER OR (KITS) ×2 IMPLANT
MANIFOLD NEPTUNE II (INSTRUMENTS) ×1 IMPLANT
NS IRRIG 1000ML POUR BTL (IV SOLUTION) ×2 IMPLANT
PACK GENERAL/GYN (CUSTOM PROCEDURE TRAY) ×2 IMPLANT
PAD ARMBOARD 7.5X6 YLW CONV (MISCELLANEOUS) ×2 IMPLANT
SPONGE LAP 18X18 X RAY DECT (DISPOSABLE) ×1 IMPLANT
STAPLER VISISTAT 35W (STAPLE) ×1 IMPLANT
STOCKINETTE IMPERVIOUS LG (DRAPES) ×2 IMPLANT
SUT ETHILON 2 0 PSLX (SUTURE) IMPLANT
SUT SILK 2 0 (SUTURE) ×2
SUT SILK 2-0 18XBRD TIE 12 (SUTURE) ×1 IMPLANT
SUT VIC AB 1 CTX 27 (SUTURE) ×2 IMPLANT
SYRINGE 60CC LL (MISCELLANEOUS) ×1 IMPLANT
TOWEL OR 17X26 10 PK STRL BLUE (TOWEL DISPOSABLE) ×2 IMPLANT

## 2016-12-14 NOTE — Progress Notes (Signed)
Pt arrived to short stay and is scheduled for surgery with Dr.Duda. Pt refuses to take his teeth out and also refuses to take his underwear off.

## 2016-12-14 NOTE — Anesthesia Procedure Notes (Signed)
Procedure Name: LMA Insertion Date/Time: 12/14/2016 5:17 PM Performed by: Lucinda DellECARLO, Maya Scholer M Pre-anesthesia Checklist: Patient identified, Emergency Drugs available, Suction available and Patient being monitored Patient Re-evaluated:Patient Re-evaluated prior to inductionOxygen Delivery Method: Circle system utilized Preoxygenation: Pre-oxygenation with 100% oxygen Intubation Type: IV induction LMA: LMA inserted LMA Size: 4.0 Tube type: Oral Number of attempts: 1 Placement Confirmation: positive ETCO2 and breath sounds checked- equal and bilateral Tube secured with: Tape Dental Injury: Teeth and Oropharynx as per pre-operative assessment

## 2016-12-14 NOTE — Transfer of Care (Signed)
Immediate Anesthesia Transfer of Care Note  Patient: Derrick [name]  Procedure(s) Performed: Procedure(s): AMPUTATION BELOW KNEE RIGHT (Right)  Patient Location: PACU  Anesthesia Type:General  Level of Consciousness: awake, alert  and oriented  Airway & Oxygen Therapy: Patient Spontanous Breathing and Patient connected to nasal cannula oxygen  Post-op Assessment: Report given to RN and Post -op Vital signs reviewed and stable  Post vital signs: Reviewed and stable  Last Vitals:  Vitals:   12/13/16 0501 12/13/16 1410  BP: 136/75 122/61  Pulse: 102 97  Resp: 18 17  Temp: 36.9 C 36.6 C    Last Pain:  Vitals:   12/14/16 0200  TempSrc:   PainSc: Asleep         Complications: No apparent anesthesia complications

## 2016-12-14 NOTE — Progress Notes (Signed)
PROGRESS NOTE  Derrick [name]  YPP:[id number] DOB: [birth year]  DOA: 12/13/2016 PCP: Pcp Not In System   Brief Narrative:  45 y.o. male with medical history significant of hypertension, diabetes mellitus, peripheral neuropathy, tobacco abuse, prostatitis, s/p of L BKA and R toe amputation, who presents with a right foot lesion and infection. Patient has chronic right foot diabetic lesion, which has worsened in the past 6 days. Initially noticed blisters that popped open and had foul smelling purulent drain. Pt states that his right foot "has a hole in it."  He had fever of 103.5 and at one time point at home, and chills. Patient was admitted for sepsis secondary to diabetic foot infection. Orthopedics consulted and plan right transtibial amputation on 12/28. Patient has been uncooperative with several aspects of medical care since hospital admission despite counseling by multiple medical personnel.   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 Sepsis secondary to diabetic right foot infection: Patient met sepsis criteria on admission. X-ray of the right foot showed possible necrotizing fasciitis. Continue empirically started IV Zosyn, clindamycin and linezolid. He is allergic to vancomycin. MRI of right foot consistent with osteomyelitis of body and posterior calcaneus, anterior talus, cuboid and base of fifth metatarsal. Orthopedic consultation and follow-up appreciated and plan for right transtibial amputation today. Blood cultures: Negative to date 2. 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 Started Lantus 20 units daily at bedtime and changed NovoLog SSI to moderate with bedtime scale.  Monitor closely. Patient refusing CBG checks and multiple aspects of care. 3. Tobacco abuse: Cessation counseled. Patient declines nicotine patch but wants to go out of the floor to smoke and advised him that he is not allowed to do so. 4. Hypokalemia: Replaced on admission and night of 12/27. Magnesium 1.1 on 12/27 and was replaced IV. Follow BMP >patient refuses labs. 5. Essential hypertension: Controlled. Continue lisinopril. 6. Leukocytosis: Secondary to sepsis. Follow CBCs >patient refuses labs. 7. Elevated lactate: Secondary to sepsis. IV fluids, treatment as above. Resolved.  8. Medical noncompliance: As per nursing report 12/27, patient was very abusive with transport staff, MRI staff and nursing. Met with patient along with charge nurse, assistant nursing director, patient's nurse and advised him that he has to treat everyone with respect. He verbalized understanding. Also patient has been refusing labs and telemetry.    DVT prophylaxis: SCDs Code Status: Full Family Communication: Discussed with patient's male friend at bedside Disposition Plan: Possible DC to SNF when medically stable.   Consultants:   Orthopedics  Procedures:   None  Antimicrobials:   IV Zosyn, clindamycin, linezolid    Subjective: Seen this morning prior to surgery. Denied complaints. As per RN, refusing lab draws and CBG checks. Foul odor in room.  Objective:  Vitals:   12/13/16 0415 12/13/16 0501 12/13/16 1410 12/13/16 1649  BP: 115/64 136/75 122/61   Pulse: 100 102 97   Resp:  18 17   Temp:  98.5 F (36.9 C) 97.9 F (36.6 C)   TempSrc:   Oral   SpO2: 99% 97% 97%   Weight:    90.7 kg (200 lb)  Height:    '6\' 1"'  (1.854  m)    Intake/Output Summary (Last 24 hours) at 12/14/16 1623 Last data filed at 12/14/16 0631  Gross per 24 hour  Intake          1552.08 ml  Output                0 ml  Net          1552.08 ml   Filed Weights   12/13/16 1649  Weight: 90.7 kg (200 lb)     Examination:  General exam: Pleasant young male lying comfortably propped up in bed.  Respiratory system: Clear to auscultation. Respiratory effort normal. Cardiovascular system: S1 & S2 heard, RRR. No JVD, murmurs, rubs, gallops or clicks. No pedal edema.  Gastrointestinal system: Abdomen is nondistended, soft and nontender. No organomegaly or masses felt. Normal bowel sounds heard. Central nervous system: Alert and oriented. No focal neurological deficits. Extremities: Symmetric 5 x 5 power. Left BKA healed stump. Skin: pt has three lesion in right foot. Under medial malleolus, 4x5 cm necrotic wound draining pus and foul smelling with surrounding erythema and induration. Under lateral malleolus, 1x1 cm necrotic ulcer that is draining pus. 5x5cm open diabetic ulcer with purulent draining over bottom of foot. No crepitus appreciated.  Has right forefoot amputation. Psychiatry: Judgement and insight appear normal. Mood & affect appropriate.     Data Reviewed: I have personally reviewed following labs and imaging studies  CBC:  Recent Labs Lab 12/13/16 0021  WBC 29.2*  NEUTROABS 23.3*  HGB 13.2  HCT 38.8*  MCV 78.9  PLT 950*   Basic Metabolic Panel:  Recent Labs Lab 12/13/16 0021 12/13/16 1824  NA 131* 131*  K 3.0* 3.0*  CL 88* 91*  CO2 27 27  GLUCOSE 497* 385*  BUN 6 5*  CREATININE 1.07 0.83  CALCIUM 8.6* 8.1*  MG  --  1.1*   GFR: Estimated Creatinine Clearance: 128.4 mL/min (by C-G formula based on SCr of 0.83 mg/dL). Liver Function Tests:  Recent Labs Lab 12/13/16 0021  AST 32  ALT 19  ALKPHOS 256*  BILITOT 0.5  PROT 8.9*  ALBUMIN 2.3*   No results for input(s): LIPASE, AMYLASE in the last 168 hours. No results for input(s): AMMONIA in the last 168 hours. Coagulation Profile: No results for input(s): INR, PROTIME in the last 168 hours. Cardiac Enzymes: No results for input(s): CKTOTAL, CKMB, CKMBINDEX, TROPONINI in the last 168 hours. BNP (last 3  results) No results for input(s): PROBNP in the last 8760 hours. HbA1C:  Recent Labs  12/13/16 1824  HGBA1C 14.5*   CBG:  Recent Labs Lab 12/13/16 1101 12/13/16 1653 12/14/16 0811 12/14/16 1115 12/14/16 1510  GLUCAP 294* 337* 373* 315* 265*   Lipid Profile: No results for input(s): CHOL, HDL, LDLCALC, TRIG, CHOLHDL, LDLDIRECT in the last 72 hours. Thyroid Function Tests: No results for input(s): TSH, T4TOTAL, FREET4, T3FREE, THYROIDAB in the last 72 hours. Anemia Panel: No results for input(s): VITAMINB12, FOLATE, FERRITIN, TIBC, IRON, RETICCTPCT in the last 72 hours.  Sepsis Labs:  Recent Labs Lab 12/13/16 0035 12/13/16 1824  LATICACIDVEN 3.24* 1.7    Recent Results (from the past 240 hour(s))  Blood culture (routine x 2)     Status: None (Preliminary result)   Collection Time: 12/13/16  1:10 AM  Result Value Ref Range Status   Specimen Description BLOOD RIGHT WRIST  Final   Special Requests BOTTLES DRAWN AEROBIC AND ANAEROBIC 5ML  Final   Culture NO GROWTH 1 DAY  Final  Report Status PENDING  Incomplete  Surgical PCR screen     Status: None   Collection Time: 12/14/16  1:14 AM  Result Value Ref Range Status   MRSA, PCR NEGATIVE NEGATIVE Final   Staphylococcus aureus NEGATIVE NEGATIVE Final    Comment:        The Xpert SA Assay (FDA approved for NASAL specimens in patients over 47 years of age), is one component of a comprehensive surveillance program.  Test performance has been validated by [facility] ([place]) for patients greater than or equal to 77 year old. It is not intended to diagnose infection nor to guide or monitor treatment.          Radiology Studies: Mr Foot Right W Wo Contrast  Result Date: 12/13/2016 CLINICAL DATA:  45 year old male with hypertension and diabetes with peripheral neuropathy complaining of right foot blister and ulceration. Fever of 103.5. EXAM: MRI OF THE RIGHT FOREFOOT WITHOUT AND WITH CONTRAST TECHNIQUE: Multiplanar,  multisequence MR imaging of the right foot was performed before and after the administration of intravenous contrast. CONTRAST:  61m MULTIHANCE GADOBENATE DIMEGLUMINE 529 MG/ML IV SOLN COMPARISON:  Radiographs from 12/13/2016 and MRI from 02/25/2012 FINDINGS: Bones/Joint/Cartilage Mid metatarsal surgical amputation noted of the first through fourth metatarsals and amputation near the base of the fifth metatarsal. The joint spaces of the midfoot are disrupted and disorganized a a pattern consistent with Charcot arthropathy. Slight rocker bottom configuration of the midfoot as result. Bones of the midfoot are disorganized in appearance and there are areas of bone marrow edema and enhancement involving portions of the cuboid, posteromedial calcaneus deep to soft tissue ulceration containing air as well as along the anterior aspect of the talus. Bone marrow edema also noted at the base of the fifth anterior third and fourth metatarsals. Ligaments No focal tear of the medial nor lateral ankle ligamentous stabilizers. Muscles and Tendons The tendons crossing the ankle joint appear intact with normal signal. Soft tissues Soft tissue ulceration with subcutaneous emphysema noted along the posteromedial aspect of the hindfoot. Plantar soft tissue edema of the mid foot. IMPRESSION: Midfoot amputation at the metatarsal level. Charcot arthropathy of the midfoot complicated by abnormal marrow edema and enhancement consistent with osteomyelitis involving the body and posterior calcaneus (deep to soft tissue ulceration) with additional areas suspicious of superimposed osteomyelitis involving the anterior talus, cuboid and base of fifth metatarsal. Electronically Signed   By: DAshley RoyaltyM.D.   On: 12/13/2016 20:25   Dg Foot Complete Right  Result Date: 12/13/2016 CLINICAL DATA:  Acute onset of lateral right foot and heel discoloration and pain. Initial encounter. EXAM: RIGHT FOOT COMPLETE - 3+ VIEW COMPARISON:  Right foot  radiographs performed 09/17/2012 FINDINGS: Diffuse soft tissue air is seen tracking about the hindfoot and midfoot, extending about the heel, highly suspicious for necrotizing fasciitis. Diffuse soft tissue swelling is noted. Mild soft tissue air extends into the remaining forefoot. A small amount of air is noted at the posterior lower leg. The patient is status post amputation along the metatarsals. There is diffuse degenerative change about the midfoot. Underlying osteomyelitis cannot be excluded. There is chronic partial resorption of the cuboid. IMPRESSION: 1. Diffuse soft tissue air tracking about the hindfoot and midfoot, extending about the heel, highly suspicious for necrotizing fasciitis. Mild soft tissue air extends into the remaining forefoot. Small amount of air noted at the posterior lower leg. 2. New diffuse degenerative change about the midfoot. Underlying osteomyelitis cannot be excluded. Chronic partial resorption of the  cuboid. These results were called by telephone at the time of interpretation on 12/13/2016 at 1:11 am to Dr. Brantley Stage, who verbally acknowledged these results. Electronically Signed   By: Garald Balding M.D.   On: 12/13/2016 01:13        Scheduled Meds: . [MAR Hold] clindamycin (CLEOCIN) IV  900 mg Intravenous Q8H  . [MAR Hold] insulin aspart  0-15 Units Subcutaneous TID WC  . [MAR Hold] insulin aspart  0-5 Units Subcutaneous QHS  . [MAR Hold] insulin glargine  20 Units Subcutaneous QHS  . [MAR Hold] linezolid (ZYVOX) IV  600 mg Intravenous Q12H  . [MAR Hold] lisinopril  10 mg Oral Daily  . [MAR Hold] multivitamin with minerals  1 tablet Oral Daily  . [MAR Hold] nicotine  21 mg Transdermal Daily  . [MAR Hold] piperacillin-tazobactam (ZOSYN)  IV  3.375 g Intravenous Q8H  . [MAR Hold] sodium chloride flush  3 mL Intravenous Q12H   Continuous Infusions: . sodium chloride 0.9 % 1,000 mL with potassium chloride 40 mEq infusion 125 mL/hr at 12/13/16 2142     LOS: 1  day       [facility], MD [facility] Pager [phone]  If 7PM-7AM, please contact night-coverage [email or web address] Password [facility] 12/14/2016, 4:23 PM

## 2016-12-14 NOTE — Consult Note (Signed)
ORTHOPAEDIC CONSULTATION  REQUESTING PHYSICIAN: Elease EtienneAnand D Hongalgi, MD  Chief Complaint: Gangrene abscess right hindfoot  HPI: Derrick RoseBobby L Lundblad is a 45 y.o. male who presents with foul-smelling purulent drainage with gangrene abscess ulceration right hindfoot status post transtibial amputation the left with uncontrolled diabetes and chronic tobacco abuse.  Past Medical History:  Diagnosis Date  . Diabetes mellitus   . Peripheral neuropathy (HCC)   . Pneumonia   . Prostatitis    Past Surgical History:  Procedure Laterality Date  . AMPUTATION  05/31/2012   Procedure: AMPUTATION DIGIT;  Surgeon: Nadara MustardMarcus V Jacek Colson, MD;  [facility];  Service: Orthopedics;  Laterality: Right;  right fourth and fifth toe ray amputation   . I&D EXTREMITY  08/02/2012   Procedure: IRRIGATION AND DEBRIDEMENT EXTREMITY;  Surgeon: Nadara MustardMarcus V Mohini Heathcock, MD;  Location: [facility] OR;  Service: Orthopedics;  Laterality: Right;  Right Foot Irrigation and Debridement, Place Antibiotic Beads  . SHOULDER SURGERY Bilateral   . STUMP REVISION Left 07/12/2016   Procedure: Revision Left Below Knee Amputation;  Surgeon: Nadara MustardMarcus V Kirstan Fentress, MD;  Location: [facility] OR;  Service: Orthopedics;  Laterality: Left;  . TOE AMPUTATION     multiple toes amputated.   Social History   Social History  . Marital status: Married    Spouse name: N/A  . Number of children: N/A  . Years of education: N/A   Social History Main Topics  . Smoking status: Current Every Day Smoker    Packs/day: 2.00    Years: 24.00    Types: Cigarettes  . Smokeless tobacco: Never Used  . Alcohol use No  . Drug use: No  . Sexual activity: Yes    Birth control/ protection: None   Other Topics Concern  . None   Social History Narrative  . None   Family History  Problem Relation Age of Onset  . Cancer Mother   . Congestive Heart Failure Father    - negative except otherwise stated in the family history section Allergies  Allergen Reactions  . Vancomycin Other (See  Comments)    Acute kidney injury   Prior to Admission medications   Not on File   Mr Foot Right W Wo Contrast  Result Date: 12/13/2016 CLINICAL DATA:  [age] year old male with hypertension and diabetes with peripheral neuropathy complaining of right foot blister and ulceration. Fever of 103.5. EXAM: MRI OF THE RIGHT FOREFOOT WITHOUT AND WITH CONTRAST TECHNIQUE: Multiplanar, multisequence MR imaging of the right foot was performed before and after the administration of intravenous contrast. CONTRAST:  20mL MULTIHANCE GADOBENATE DIMEGLUMINE 529 MG/ML IV SOLN COMPARISON:  Radiographs from 12/13/2016 and MRI from 02/25/2012 FINDINGS: Bones/Joint/Cartilage Mid metatarsal surgical amputation noted of the first through fourth metatarsals and amputation near the base of the fifth metatarsal. The joint spaces of the midfoot are disrupted and disorganized a a pattern consistent with Charcot arthropathy. Slight rocker bottom configuration of the midfoot as result. Bones of the midfoot are disorganized in appearance and there are areas of bone marrow edema and enhancement involving portions of the cuboid, posteromedial calcaneus deep to soft tissue ulceration containing air as well as along the anterior aspect of the talus. Bone marrow edema also noted at the base of the fifth anterior third and fourth metatarsals. Ligaments No focal tear of the medial nor lateral ankle ligamentous stabilizers. Muscles and Tendons The tendons crossing the ankle joint appear intact with normal signal. Soft tissues Soft tissue ulceration with subcutaneous emphysema noted along the posteromedial aspect  of the hindfoot. Plantar soft tissue edema of the mid foot. IMPRESSION: Midfoot amputation at the metatarsal level. Charcot arthropathy of the midfoot complicated by abnormal marrow edema and enhancement consistent with osteomyelitis involving the body and posterior calcaneus (deep to soft tissue ulceration) with additional areas suspicious of  superimposed osteomyelitis involving the anterior talus, cuboid and base of fifth metatarsal. Electronically Signed   By: Tollie Ethavid  Kwon M.D.   On: 12/13/2016 20:25   Dg Foot Complete Right  Result Date: 12/13/2016 CLINICAL DATA:  Acute onset of lateral right foot and heel discoloration and pain. Initial encounter. EXAM: RIGHT FOOT COMPLETE - 3+ VIEW COMPARISON:  Right foot radiographs performed 09/17/2012 FINDINGS: Diffuse soft tissue air is seen tracking about the hindfoot and midfoot, extending about the heel, highly suspicious for necrotizing fasciitis. Diffuse soft tissue swelling is noted. Mild soft tissue air extends into the remaining forefoot. A small amount of air is noted at the posterior lower leg. The patient is status post amputation along the metatarsals. There is diffuse degenerative change about the midfoot. Underlying osteomyelitis cannot be excluded. There is chronic partial resorption of the cuboid. IMPRESSION: 1. Diffuse soft tissue air tracking about the hindfoot and midfoot, extending about the heel, highly suspicious for necrotizing fasciitis. Mild soft tissue air extends into the remaining forefoot. Small amount of air noted at the posterior lower leg. 2. New diffuse degenerative change about the midfoot. Underlying osteomyelitis cannot be excluded. Chronic partial resorption of the cuboid. These results were called by telephone at the time of interpretation on 12/13/2016 at 1:11 am to Dr. Crista CurbANA LIU, who verbally acknowledged these results. Electronically Signed   By: Roanna RaiderJeffery  Chang M.D.   On: 12/13/2016 01:13   - pertinent xrays, CT, MRI studies were reviewed and independently interpreted  Positive ROS: All other systems have been reviewed and were otherwise negative with the exception of those mentioned in the HPI and as above.  Physical Exam: General: Alert, no acute distress Psychiatric: Patient is competent for consent with normal mood and affect Lymphatic: No axillary or  cervical lymphadenopathy Cardiovascular: No pedal edema Respiratory: No cyanosis, no use of accessory musculature GI: No organomegaly, abdomen is soft and non-tender  Skin: Patient has gangrene of the hindfoot with a foul-smelling purulent drainage from the right heel.   Neurologic: Patient does not have protective sensation bilateral lower extremities.   MUSCULOSKELETAL:  Examination patient has cellulitis of right foot, MRI scan shows osteomyelitis of the calcaneus. Radiographs shows soft tissue air secondary to the ulceration extending up to the ankle.  Assessment: Assessment: Diabetic insensate neuropathy left transtibial amputation with gangrene ulceration abscess and osteomyelitis of the right hindfoot.  Plan: Plan: We'll plan for right transtibial amputation as add-on today. Patient was adamant that he be able to go outside and smoke. Patient refuses any attempt at smoking cessation. Discussed the patient's increased risk of wound healing increased complication risk with continued smoking and uncontrolled diabetes. Patient states that he is not concerned and if made to stop smoking he would leave AMA and would prefer to die from his sepsis.  Thank you for the consult and the opportunity to see Mr. Letitia CaulWhitaker  Kristof Nadeem, MD [facility] [phone] 7:06 AM

## 2016-12-14 NOTE — Op Note (Signed)
   Date of Surgery: 12/14/2016  INDICATIONS: Mr. Derrick [name] is a 45 y.o.-year-old male who presents with a necrotic gangrenous foul-smelling drainage from the right foot. Patient had attempted conservative therapy.Marland Kitchen.  PREOPERATIVE DIAGNOSIS: Diabetic insensate neuropathy with necrotic gangrenous changes and purulent drainage from the right foot  POSTOPERATIVE DIAGNOSIS: Same.  PROCEDURE: Transtibial amputation Application of Prevena wound VAC, application of TXA, application of prosthetic shrinker  SURGEON: Lajoyce Cornersuda, M.D.  ANESTHESIA:  general  IV FLUIDS AND URINE: See anesthesia.  ESTIMATED BLOOD LOSS: Minimal mL.  COMPLICATIONS: None.  DESCRIPTION OF PROCEDURE: The patient was brought to the operating room and underwent a general anesthetic. After adequate levels of anesthesia were obtained patient's lower extremity was prepped using DuraPrep draped into a sterile field. A timeout was called. The foot was draped out of the sterile field with impervious stockinette. A transverse incision was made 13 cm distal to the tibial tubercle. This curved proximally and a large posterior flap was created. The tibia was transected 1 cm proximal to the skin incision. The fibula was transected just proximal to the tibial incision. The tibia was beveled anteriorly. A large posterior flap was created. The sciatic nerve was pulled cut and allowed to retract. The vascular bundles were suture ligated with 2-0 silk. The wound was then soaked in TXA prior to closure The deep and superficial fascial layers were closed using #1 Vicryl. The skin was closed using staples and 2-0 nylon. The wound was covered with a Prevena wound VAC. There was a good suction fit. A prosthetic shrinker was applied. Patient was extubated taken to the PACU in stable condition.  Derrick BakerMarcus Falynn Ailey, MD [facility] 6:06 PM

## 2016-12-14 NOTE — Anesthesia Preprocedure Evaluation (Addendum)
Anesthesia Evaluation  Patient identified by MRN, date of birth, ID band Patient awake    Reviewed: Allergy & Precautions, NPO status , Patient's Chart, lab work & pertinent test results  Airway Mallampati: II  TM Distance: >3 FB Neck ROM: Full    Dental  (+) Dental Advisory Given, Poor Dentition   Pulmonary Current Smoker,    Pulmonary exam normal breath sounds clear to auscultation       Cardiovascular hypertension, + Peripheral Vascular Disease  Normal cardiovascular exam Rhythm:Regular Rate:Normal     Neuro/Psych negative neurological ROS  negative psych ROS   GI/Hepatic negative GI ROS, Neg liver ROS,   Endo/Other  diabetes, Insulin Dependent  Renal/GU negative Renal ROS  negative genitourinary   Musculoskeletal negative musculoskeletal ROS (+)   Abdominal   Peds negative pediatric ROS (+)  Hematology negative hematology ROS (+)   Anesthesia Other Findings Patient refuses to remove partial  Reproductive/Obstetrics negative OB ROS                            Anesthesia Physical Anesthesia Plan  ASA: III  Anesthesia Plan: General   Post-op Pain Management:    Induction: Intravenous  Airway Management Planned: LMA and Oral ETT  Additional Equipment:   Intra-op Plan:   Post-operative Plan: Extubation in OR  Informed Consent: I have reviewed the patients History and Physical, chart, labs and discussed the procedure including the risks, benefits and alternatives for the proposed anesthesia with the patient or authorized representative who has indicated his/her understanding and acceptance.   Dental advisory given  Plan Discussed with: CRNA and Surgeon  Anesthesia Plan Comments:         Anesthesia Quick Evaluation

## 2016-12-15 ENCOUNTER — Encounter (HOSPITAL_COMMUNITY): Payer: Self-pay | Admitting: Orthopedic Surgery

## 2016-12-15 DIAGNOSIS — I10 Essential (primary) hypertension: Secondary | ICD-10-CM

## 2016-12-15 DIAGNOSIS — Z720 Tobacco use: Secondary | ICD-10-CM

## 2016-12-15 MED ORDER — OXYCODONE-ACETAMINOPHEN 5-325 MG PO TABS: 1.0000 | ORAL_TABLET | Freq: Three times a day (TID) | ORAL | 0 refills | Status: DC | PRN

## 2016-12-15 MED ORDER — INSULIN GLARGINE 100 UNIT/ML SOLOSTAR PEN: 20.0000 [IU] | PEN_INJECTOR | Freq: Every day | SUBCUTANEOUS | 0 refills | Status: DC

## 2016-12-15 MED ORDER — BLOOD GLUCOSE MONITOR KIT: PACK | 0 refills | Status: AC

## 2016-12-15 MED ORDER — DOXYCYCLINE HYCLATE 100 MG PO TABS: 100.0000 mg | ORAL_TABLET | Freq: Two times a day (BID) | ORAL | 0 refills | Status: DC

## 2016-12-15 MED ORDER — ACETAMINOPHEN 325 MG PO TABS: 650.0000 mg | ORAL_TABLET | Freq: Four times a day (QID) | ORAL | Status: AC | PRN

## 2016-12-15 MED ORDER — LINEZOLID 600 MG PO TABS
600.0000 mg | ORAL_TABLET | Freq: Two times a day (BID) | ORAL | Status: DC
  Administered 2016-12-15: 600 mg via ORAL
  Filled 2016-12-15: qty 1

## 2016-12-15 MED ORDER — LISINOPRIL 10 MG PO TABS: 10.0000 mg | ORAL_TABLET | Freq: Every day | ORAL | 0 refills | Status: AC

## 2016-12-15 MED ORDER — CIPROFLOXACIN HCL 500 MG PO TABS: 500.0000 mg | ORAL_TABLET | Freq: Two times a day (BID) | ORAL | 0 refills | Status: DC

## 2016-12-15 MED ORDER — INSULIN ASPART 100 UNIT/ML FLEXPEN: 0.0000 [IU] | PEN_INJECTOR | Freq: Three times a day (TID) | SUBCUTANEOUS | 0 refills | Status: DC

## 2016-12-15 MED ORDER — "PEN NEEDLES 3/16"" 31G X 5 MM MISC": 0 refills | Status: AC

## 2016-12-15 NOTE — Progress Notes (Addendum)
Nsg Discharge Note  Admit Date:  12/13/2016 Discharge date: 12/15/2016   Patient continuing to refuse most medications as well as vital signs, provider aware.   Malachy Moan to be D/C'd home per MD order.  AVS completed.  Copy for chart, and copy for patient signed, and dated. Patient/caregiver able to verbalize understanding.  Discharge Medication: Allergies as of 12/15/2016      Reactions   Vancomycin Other (See Comments)   Acute kidney injury      Medication List    TAKE these medications   acetaminophen 325 MG tablet Commonly known as:  TYLENOL Take 2 tablets (650 mg total) by mouth every 6 (six) hours as needed for mild pain or fever (or Fever >/= 101). Acetaminophen dose from all sources not to exceed 4 g per day.   blood glucose meter kit and supplies Kit Dispense based on patient and insurance preference. Use up to four times daily as directed. (FOR ICD-9 250.00, 250.01).   ciprofloxacin 500 MG tablet Commonly known as:  CIPRO Take 1 tablet (500 mg total) by mouth 2 (two) times daily.   doxycycline 100 MG tablet Commonly known as:  VIBRA-TABS Take 1 tablet (100 mg total) by mouth 2 (two) times daily.   insulin aspart 100 UNIT/ML FlexPen Commonly known as:  NOVOLOG FLEXPEN Inject 0-15 Units into the skin 3 (three) times daily with meals. CBG < 70: eat or drink something and recheck. CBG 70 - 120: 0 units CBG 121 - 150: 2 units CBG 151 - 200: 3 units CBG 201 - 250: 5 units CBG 251 - 300: 8 units CBG 301 - 350: 11 units CBG 351 - 400: 15 units CBG > 400: call MD.   Insulin Glargine 100 UNIT/ML Solostar Pen Commonly known as:  LANTUS SOLOSTAR Inject 20 Units into the skin daily at 10 pm.   lisinopril 10 MG tablet Commonly known as:  PRINIVIL,ZESTRIL Take 1 tablet (10 mg total) by mouth daily. Start taking on:  12/16/2016   oxyCODONE-acetaminophen 5-325 MG tablet Commonly known as:  PERCOCET/ROXICET Take 1 tablet by mouth every 8 (eight) hours as needed for  moderate pain or severe pain.   Pen Needles 3/16" 31G X 5 MM Misc Use as directed 3 times daily with meals and at bedtime.       Discharge Assessment: Vitals:   12/14/16 1903 12/14/16 2251  BP: (!) 104/55 118/65  Pulse: 78 80  Resp: 16 17  Temp: 97.9 F (36.6 C) 98.3 F (36.8 C)   Skin clean, dry and intact without evidence of skin break down, no evidence of skin tears noted. Portable wound vac in place. Patient's IV removed this morning and he refused to have it replaced. D/c Instructions-Education: Discharge instructions given to patient/family with verbalized understanding. D/c education completed with patient/family including follow up instructions, medication list, d/c activities limitations if indicated, with other d/c instructions as indicated by MD - patient able to verbalize understanding, all questions fully answered. Patient instructed to return to ED, call 911, or call MD for any changes in condition.  Patient has off unit privileges and insists on waiting in the lobby for his wife. Patient accompanied off unit and waiting for his wife on main floor.  Wyonia Hough, RN 12/15/2016 1:29 PM

## 2016-12-15 NOTE — Progress Notes (Addendum)
Patient shown video on insulin injections. Diabetes coordinator also met with patient. Patient verbalized understanding of how to give himself lantus and novolog pen injections.  Derrick [name]

## 2016-12-15 NOTE — Progress Notes (Signed)
Inpatient Diabetes Program Recommendations  AACE/ADA: New Consensus Statement on Inpatient Glycemic Control (2015)  Target Ranges:  Prepandial:   less than 140 mg/dL      Peak postprandial:   less than 180 mg/dL (1-2 hours)      Critically ill patients:  140 - 180 mg/dL   Lab Results  Component Value Date   GLUCAP 257 (H) 12/14/2016   HGBA1C 14.5 (H) 12/13/2016    Review of Glycemic Control  Educated pt on insulin pen administration. Discussed disposal of insulin pen needles. Pt states he has used insulin pens in the past. Was able to return demonstration with insulin pen. Discussed hypoglycemia s/s and treatment. Stressed importance of  F/u with PCP for management of DM.   Discussed above with RN.  Thank you. Ailene Ardshonda Burhanuddin Kohlmann, RD, LDN, CDE Inpatient Diabetes Coordinator [phone] you.

## 2016-12-15 NOTE — Progress Notes (Signed)
Patient ID: Derrick [name], male   DOB: 08/18/1971, 45 y.o.   MRN: [id number] Postoperative day 1 right transtibial amputation. Examination the wound VAC and been turned off. With turning the wound VAC back on it had a good suction fit with no alarms. Patient will need to be discharged with the portable Prevena wound VAC. Once this stops working he will discontinue the wound VAC dressing and sponge and continue to wear the stump shrinker. I will follow-up in the office in 1 week. Patient states he wants to be discharged today.

## 2016-12-15 NOTE — Discharge Summary (Addendum)
Physician Discharge Summary  Derrick [name] ZJQ:[id number] DOB: [birth year]  PCP: Dr. Chester Holstein  Admit date: 12/13/2016 Discharge date: 12/15/2016  Recommendations for Outpatient Follow-up:  1. Dr. Chester Holstein, PCP in one week with repeat labs (CBC, BMP & magnesium). Will need follow-up and adjustment of his diabetic medications. Please follow final blood culture results that were sent from the hospital. 2. Dr. Meridee Score, Orthopedics in 1 week.  Home Health: None Equipment/Devices: None    Discharge Condition: Improved and stable.  CODE STATUS: Full  Diet recommendation: Heart healthy and diabetic diet.  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 Patient has chronic right foot diabetic lesion, which has worsened in the past 6 days. Initially noticed blisters that popped open and had foul smelling purulent drain. Pt states that his right foot "has a hole in it." He had fever of 103.5 and at one time point at home, and chills. Patient was admitted for sepsis secondary to diabetic foot infection. Orthopedics consulted and patient underwent surgery. Patient was uncooperative with several aspects of medical care since hospital admission despite counseling by multiple medical personnel.   Assessment & Plan:    1. Sepsis secondary to diabetic right foot infection: Patient met sepsis criteria on admission. X-ray of the right foot showed possible necrotizing fasciitis. Treated empirically with IV Zosyn, clindamycin and linezolid. He is allergic to  vancomycin. MRI of right foot consistent with osteomyelitis of body and posterior calcaneus, anterior talus, cuboid and base of fifth metatarsal. Blood cultures: Negative to date. Orthopedics was consulted and patient underwent right transtibial amputation on 12/28. As per RN report, patient had unplugged the wound VAC. As per orthopedic follow-up, patient will need to be discharged with a portable Prevena wound VAC and once this stops working, he will discontinue the wound VAC dressing and sponge and continue to wear the stump shrinker. Orthopedics will follow up in the office in one week. Ideally would have preferred to treat him with another 24-48 hours of IV antibiotics given significant infection that he had but patient absolutely unwilling, uncooperative and insistent on discharging home. Blood cultures 2: Negative to date. Discussed with infectious disease M.D. on call who recommended transitioning to oral Cipro and doxycycline for additional week, at discharge. 2. 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 Started Lantus 20 units daily at bedtime and changed NovoLog SSI to moderate with bedtime scale. Patient refusing CBG checks and multiple aspects of care. On day of discharge, patient willing to continue his insulins at home. Extensively educated regarding insulin and diabetic management by the diabetes coordinator and RN. Case management advised that Lantus Solostar and NovoLog flex pens would be covered under his insurance. Obviously will need close outpatient follow-up to ensure adequate management of his diabetes. However very concerned that given his performance in the hospital, he is likely to be noncompliant leading to further severe complications. This was discussed in great detail with the patient in the presence of his  son. He verbalized understanding. 3. Tobacco abuse:  Cessation counseled. Patient declines nicotine patch but wants to go out of the floor to smoke and advised him that he is not allowed to do so. Throughout the course of the hospitalization, he kept insisting on going out to smoke. 4. Hypokalemia: Replaced on admission and night of 12/27. Magnesium 1.1 on 12/27 and was replaced IV. Follow BMP >patient refuses labs. Outpatient follow-up with repeat labs. 5. Essential hypertension: Controlled. Continue lisinopril. 6. Leukocytosis: Secondary to sepsis. Follow CBCs >patient refuses labs. Outpatient follow-up with repeat labs. 7. Elevated lactate: Secondary to sepsis. IV fluids, treatment as above. Resolved.  8. Medical noncompliance: As per nursing report 12/27, patient was very abusive with transport staff, MRI staff and nursing. Met with patient along with charge nurse, assistant nursing director, patient's nurse and advised him that he has to treat everyone with respect. He verbalized understanding. Also patient has been refusing labs and telemetry.     Consultants:   Orthopedics  Procedures:   Right transtibial amputation by Dr. Sharol Given on 12/14/16.   Discharge Instructions  Discharge Instructions    Call MD for:  difficulty breathing, headache or visual disturbances    Complete by:  As directed    Call MD for:  extreme fatigue    Complete by:  As directed    Call MD for:  persistant dizziness or light-headedness    Complete by:  As directed    Call MD for:  persistant nausea and vomiting    Complete by:  As directed    Call MD for:  redness, tenderness, or signs of infection (pain, swelling, redness, odor or green/yellow discharge around incision site)    Complete by:  As directed    Call MD for:  severe uncontrolled pain    Complete by:  As directed    Call MD for:  temperature >100.4    Complete by:  As directed    Diet - low sodium heart healthy    Complete by:  As directed    Diet Carb Modified    Complete by:  As directed     Discharge wound care:    Complete by:  As directed    portable Prevena wound VAC. Once this stops working he will discontinue the wound VAC dressing and sponge and continue to wear the stump shrinker.   Increase activity slowly    Complete by:  As directed    Negative Pressure Wound Therapy - Incisional    Complete by:  As directed    Attach the incisional wound VAC to a portable Prevena wound VAC pump. When this pump stops functioning patient will remove the wound VAC dressing and continue to wear the stump shrinker.       Medication List    TAKE these medications   acetaminophen 325 MG tablet Commonly known as:  TYLENOL Take 2 tablets (650 mg total) by mouth every 6 (six) hours as needed for mild pain or fever (or Fever >/= 101). Acetaminophen dose from all sources not to exceed 4 g per day.   blood glucose meter kit and supplies Kit Dispense based on patient and insurance preference. Use up to four times daily as directed. (FOR ICD-9 250.00, 250.01).   ciprofloxacin 500 MG tablet Commonly known as:  CIPRO Take 1 tablet (500 mg total) by mouth 2 (two) times daily.   doxycycline 100 MG tablet Commonly known as:  VIBRA-TABS Take 1 tablet (100 mg total) by mouth 2 (  two) times daily.   insulin aspart 100 UNIT/ML FlexPen Commonly known as:  NOVOLOG FLEXPEN Inject 0-15 Units into the skin 3 (three) times daily with meals. CBG < 70: eat or drink something and recheck. CBG 70 - 120: 0 units CBG 121 - 150: 2 units CBG 151 - 200: 3 units CBG 201 - 250: 5 units CBG 251 - 300: 8 units CBG 301 - 350: 11 units CBG 351 - 400: 15 units CBG > 400: call MD.   Insulin Glargine 100 UNIT/ML Solostar Pen Commonly known as:  LANTUS SOLOSTAR Inject 20 Units into the skin daily at 10 pm.   lisinopril 10 MG tablet Commonly known as:  PRINIVIL,ZESTRIL Take 1 tablet (10 mg total) by mouth daily. Start taking on:  12/16/2016   oxyCODONE-acetaminophen 5-325 MG tablet Commonly known as:   PERCOCET/ROXICET Take 1 tablet by mouth every 8 (eight) hours as needed for moderate pain or severe pain.   Pen Needles 3/16" 31G X 5 MM Misc Use as directed 3 times daily with meals and at bedtime.      Follow-up Information    Newt Minion, MD Follow up in 1 week(s).   Specialty:  Orthopedic Surgery Contact information: [place] [address] [phone]        Chester Holstein, MD. Schedule an appointment as soon as possible for a visit in 1 week(s).   Specialty:  Family Medicine Why:  To be seen with repeat labs (CBC, BMP & Mg). Will need follow-up and adjustment of his diabetic medications. Contact information: [place] [address] [phone]          Allergies  Allergen Reactions  . Vancomycin Other (See Comments)    Acute kidney injury      Procedures/Studies: Mr Foot Right W Wo Contrast  Result Date: 12/13/2016 CLINICAL DATA:  45 year old male with hypertension and diabetes with peripheral neuropathy complaining of right foot blister and ulceration. Fever of 103.5. EXAM: MRI OF THE RIGHT FOREFOOT WITHOUT AND WITH CONTRAST TECHNIQUE: Multiplanar, multisequence MR imaging of the right foot was performed before and after the administration of intravenous contrast. CONTRAST:  30m MULTIHANCE GADOBENATE DIMEGLUMINE 529 MG/ML IV SOLN COMPARISON:  Radiographs from 12/13/2016 and MRI from 02/25/2012 FINDINGS: Bones/Joint/Cartilage Mid metatarsal surgical amputation noted of the first through fourth metatarsals and amputation near the base of the fifth metatarsal. The joint spaces of the midfoot are disrupted and disorganized a a pattern consistent with Charcot arthropathy. Slight rocker bottom configuration of the midfoot as result. Bones of the midfoot are disorganized in appearance and there are areas of bone marrow edema and enhancement involving portions of the cuboid, posteromedial calcaneus deep to soft  tissue ulceration containing air as well as along the anterior aspect of the talus. Bone marrow edema also noted at the base of the fifth anterior third and fourth metatarsals. Ligaments No focal tear of the medial nor lateral ankle ligamentous stabilizers. Muscles and Tendons The tendons crossing the ankle joint appear intact with normal signal. Soft tissues Soft tissue ulceration with subcutaneous emphysema noted along the posteromedial aspect of the hindfoot. Plantar soft tissue edema of the mid foot. IMPRESSION: Midfoot amputation at the metatarsal level. Charcot arthropathy of the midfoot complicated by abnormal marrow edema and enhancement consistent with osteomyelitis involving the body and posterior calcaneus (deep to soft tissue ulceration) with additional areas suspicious of superimposed osteomyelitis involving the anterior talus, cuboid and base of fifth metatarsal.  Electronically Signed   By: Ashley Royalty M.D.   On: 12/13/2016 20:25   Dg Foot Complete Right  Result Date: 12/13/2016 CLINICAL DATA:  Acute onset of lateral right foot and heel discoloration and pain. Initial encounter. EXAM: RIGHT FOOT COMPLETE - 3+ VIEW COMPARISON:  Right foot radiographs performed 09/17/2012 FINDINGS: Diffuse soft tissue air is seen tracking about the hindfoot and midfoot, extending about the heel, highly suspicious for necrotizing fasciitis. Diffuse soft tissue swelling is noted. Mild soft tissue air extends into the remaining forefoot. A small amount of air is noted at the posterior lower leg. The patient is status post amputation along the metatarsals. There is diffuse degenerative change about the midfoot. Underlying osteomyelitis cannot be excluded. There is chronic partial resorption of the cuboid. IMPRESSION: 1. Diffuse soft tissue air tracking about the hindfoot and midfoot, extending about the heel, highly suspicious for necrotizing fasciitis. Mild soft tissue air extends into the remaining forefoot. Small  amount of air noted at the posterior lower leg. 2. New diffuse degenerative change about the midfoot. Underlying osteomyelitis cannot be excluded. Chronic partial resorption of the cuboid. These results were called by telephone at the time of interpretation on 12/13/2016 at 1:11 am to Dr. Brantley Stage, who verbally acknowledged these results. Electronically Signed   By: Garald Balding M.D.   On: 12/13/2016 01:13      Subjective: Insisting on going home. Not willing to wait for his discharge paperwork/prescriptions/diabetic education to be completed. Denies any complaints. Denies pain. Again asking to go out to smoke.  Discharge Exam:  Vitals:   12/14/16 1835 12/14/16 1845 12/14/16 1903 12/14/16 2251  BP: 108/76 114/75 (!) 104/55 118/65  Pulse: 77  78 80  Resp: _0 Temp:  97.3 F (36.3 C) 97.9 F (36.6 C) 98.3 F (36.8 C)  TempSrc:   Oral Oral  SpO2: 98%  91% 98%  Weight:      Height:        General exam: Pleasant young male lying comfortably propped up in bed.  Respiratory system: Clear to auscultation. Respiratory effort normal. Cardiovascular system: S1 & S2 heard, RRR. No JVD, murmurs, rubs, gallops or clicks. Gastrointestinal system: Abdomen is nondistended, soft and nontender. No organomegaly or masses felt. Normal bowel sounds heard. Central nervous system: Alert and oriented. No focal neurological deficits. Extremities: Symmetric 5 x 5 power. Left BKA healed stump. New right transtibial amputation site dressing clean and dry. Psychiatry: Judgement and insight appear impaired. Mood & affect appropriate.     The results of significant diagnostics from this hospitalization (including imaging, microbiology, ancillary and laboratory) are listed below for reference.     Microbiology: Recent Results (from the past 240 hour(s))  Blood culture (routine x 2)     Status: None (Preliminary result)   Collection Time: 12/13/16  1:10 AM  Result Value Ref Range Status   Specimen  Description BLOOD RIGHT WRIST  Final   Special Requests BOTTLES DRAWN AEROBIC AND ANAEROBIC 5ML  Final   Culture NO GROWTH 1 DAY  Final   Report Status PENDING  Incomplete  Surgical PCR screen     Status: None   Collection Time: 12/14/16  1:14 AM  Result Value Ref Range Status   MRSA, PCR NEGATIVE NEGATIVE Final   Staphylococcus aureus NEGATIVE NEGATIVE Final    Comment:        The Xpert SA Assay (FDA approved for NASAL specimens in patients over 62 years of age), is one  component of a comprehensive surveillance program.  Test performance has been validated by [facility] for patients greater than or equal to 17 year old. It is not intended to diagnose infection nor to guide or monitor treatment.      Labs: BNP (last 3 results) No results for input(s): BNP in the last 8760 hours. Basic Metabolic Panel:  Recent Labs Lab 12/13/16 0021 12/13/16 1824  NA 131* 131*  K 3.0* 3.0*  CL 88* 91*  CO2 27 27  GLUCOSE 497* 385*  BUN 6 5*  CREATININE 1.07 0.83  CALCIUM 8.6* 8.1*  MG  --  1.1*   Liver Function Tests:  Recent Labs Lab 12/13/16 0021  AST 32  ALT 19  ALKPHOS 256*  BILITOT 0.5  PROT 8.9*  ALBUMIN 2.3*   No results for input(s): LIPASE, AMYLASE in the last 168 hours. No results for input(s): AMMONIA in the last 168 hours. CBC:  Recent Labs Lab 12/13/16 0021  WBC 29.2*  NEUTROABS 23.3*  HGB 13.2  HCT 38.8*  MCV 78.9  PLT 578*   CBG:  Recent Labs Lab 12/13/16 1653 12/14/16 0811 12/14/16 1115 12/14/16 1510 12/14/16 1805  GLUCAP 337* 373* 315* 265* 257*   Hgb A1c  Recent Labs  12/13/16 1824  HGBA1C 14.5*   Discussed in detail with patient's son at bedside.  Time coordinating discharge: Over 30 minutes  SIGNED:  Vernell Leep, MD, FACP, [place]. [facility] Pager [phone]  If 7PM-7AM, please contact night-coverage [email or web address] Password [facility] 12/15/2016, 12:29 PM

## 2016-12-15 NOTE — Progress Notes (Signed)
Patient refusing all medication and replacement of IV which was removed this morning. He states the doctor told him he could go home after seeing physical therapy. Patient educated on the importance of antibiotics to prevent further infection. He still declines. Will continue to monitor. Derrick CarboAubrey  [name]

## 2016-12-15 NOTE — Care Management Note (Signed)
Case Management Note  Patient Details  Name: Derrick [name] MRN: [id number] Date of Birth: 10/07/1971  Subjective/Objective:                 Spoke to patiet and PT in room. Patient does not require HH or DME. Patient needs switched to Provena Plus VAC prior to discharge. Not in room. Spoke with Oralia Rudicky Toye RN [employer] V[employer]. She states to take sticker to OR and get one there. Charge RN Shinita to get pump. [facility] [address] AVS updated with contact information. Patient to follow up with Lajoyce Cornersuda in 1 week.    Action/Plan:  DC to home self care.   Expected Discharge Date:                  Expected Discharge Plan:  Home/Self Care  In-House Referral:     Discharge planning Services  CM Consult  Post Acute Care Choice:    Choice offered to:     DME Arranged:    DME Agency:     HH Arranged:    HH Agency:     Status of Service:  Completed, signed off  If discussed at [employer] Length of Stay Meetings, dates discussed:    Additional Comments:  Lawerance SabalDebbie Asaiah Scarber, RN 12/15/2016, 10:50 AM

## 2016-12-15 NOTE — Consult Note (Addendum)
WOC consult requested to assist with application of Prevena negative pressure device.  Dr Lajoyce Cornersuda of the ortho service is following for assessment and plan of care to right stump and has told patient to remove the Prevena dressing machine and dressing and throw them away when the battery is no longer functioning.  Pt verbalized understanding and has used one in the past.  Disconnected from hospital Vac machine and connected to Prevena portable device at 100mm cont suction.  Machine functioning without alarms, dressing left intact over postop wound. Pt plans to discharge home today. Please re-consult if further assistance is needed.  Thank-you,  Cammie Mcgeeawn Tymel Conely MSN, RN, CWOCN, [place], CNS [phone]

## 2016-12-15 NOTE — Evaluation (Signed)
Physical Therapy Evaluation/Discharge Patient Details Name: Derrick RoseBobby L Munsch MRN: [id number] DOB: 02/22/1971 Today's Date: 12/15/2016   History of Present Illness  45 y.o. male admitted to [facility] on 12/13/16 for R transtibial amputation with wound vac application.  Pt with significant PMHx of L transtibial amputation (has a prosthesis), DM, peripheral neuropathy, and multiple bil LE surgeries.   Clinical Impression  Pt is mobilizing well at a mod I level which makes him safe to return home where he has a ramped access.  Once healed and fitted for a R LE prosthesis, I would recommend OP PT f/u, however, none needed at this time.  PT to sign off.     Follow Up Recommendations No PT follow up;Other (comment) (until he gets his right leg prosthesis and then OP PT)    Equipment Recommendations  None recommended by PT    Recommendations for Other Services   NA    Precautions / Restrictions Precautions Precautions: Fall Precaution Comments: due to bil BKAs Required Braces or Orthoses: Other Brace/Splint Other Brace/Splint: has a wound vac right leg Restrictions Weight Bearing Restrictions: Yes RLE Weight Bearing: Non weight bearing      Mobility  Bed Mobility Overal bed mobility: Modified Independent                Transfers Overall transfer level: Modified independent Equipment used: None             General transfer comment: Pt demonstrated good lateral scoot into his electric scooter and back into bed.    Ambulation/Gait             General Gait Details: NT as pt did not have his L LE prosthesis with him at this time.          Balance Overall balance assessment: Modified Independent                                           Pertinent Vitals/Pain Pain Assessment: 0-10 Pain Score: 5  Pain Location: right leg Pain Descriptors / Indicators: Aching;Burning Pain Intervention(s): Limited activity within patient's tolerance;Monitored  during session;Repositioned    Home Living Family/patient expects to be discharged to:: Private residence Living Arrangements: Children Available Help at Discharge: Family;Available PRN/intermittently Type of Home: House Home Access: Ramped entrance     Home Layout: One level Home Equipment: Art gallery managerlectric scooter (two electric scooters)      Prior Function Level of Independence: Independent                  Extremity/Trunk Assessment   Upper Extremity Assessment Upper Extremity Assessment: Overall WFL for tasks assessed    Lower Extremity Assessment Lower Extremity Assessment: RLE deficits/detail RLE Deficits / Details: right leg with normal post op pain and weakness, at least 3/5 knee extension, 3/5 hip abduction, flexion good knee and hip extension ROM.     Cervical / Trunk Assessment Cervical / Trunk Assessment: Normal  Communication   Communication: No difficulties  Cognition Arousal/Alertness: Awake/alert Behavior During Therapy: WFL for tasks assessed/performed Overall Cognitive Status: Within Functional Limits for tasks assessed                         Exercises Amputee Exercises Quad Sets: AROM;Right;10 reps Hip Extension: AROM;Right;10 reps;Sidelying Hip ABduction/ADduction: AROM;Right;10 reps;Sidelying   Assessment/Plan    PT Assessment Patent does not need any  further PT services            PT Goals (Current goals can be found in the Care Plan section)  Acute Rehab PT Goals Patient Stated Goal: to go home, he really wants to get back to playing golf and being able to pick up his ball off of the ground.   PT Goal Formulation: With patient               End of Session   Activity Tolerance: Patient limited by pain Patient left: in bed;with call bell/phone within reach Nurse Communication: Mobility status         Time: [id number] PT Time Calculation (min) (ACUTE ONLY): 17 min   Charges:   PT Evaluation $PT Eval Moderate  Complexity: 1 Procedure          Shaden Lacher B. Braxton Vantrease, PT, DPT [phone]   12/15/2016, 10:49 AM

## 2016-12-15 NOTE — Progress Notes (Signed)
Patient's wound vac began beeping after being unplugged by family member. This RN attempted to turn on vac however leak was detected. This RN told patient that the prosthetic shrinker had to be remove so that the wound vac dressing could be fixed. Patient requested  that this RN not remove the black prosthetic shrinker. Wound vac was turned off per patient request. At this time, no drainage was noted in the wound vac canister. Will continue to monitor.

## 2016-12-15 NOTE — Anesthesia Postprocedure Evaluation (Signed)
Anesthesia Post Note  Patient: Derrick [name]  Procedure(s) Performed: Procedure(s) (LRB): AMPUTATION BELOW KNEE RIGHT (Right)  Patient location during evaluation: PACU Anesthesia Type: General Level of consciousness: awake and alert Pain management: pain level controlled Vital Signs Assessment: post-procedure vital signs reviewed and stable Respiratory status: spontaneous breathing, nonlabored ventilation, respiratory function stable and patient connected to nasal cannula oxygen Cardiovascular status: blood pressure returned to baseline and stable Postop Assessment: no signs of nausea or vomiting Anesthetic complications: no       Last Vitals:  Vitals:   12/14/16 1903 12/14/16 2251  BP: (!) 104/55 118/65  Pulse: 78 80  Resp: 16 17  Temp: 36.6 C 36.8 C    Last Pain:  Vitals:   12/15/16 0921  TempSrc:   PainSc: 0-No pain                 Furkan Keenum S

## 2016-12-18 LAB — CULTURE, BLOOD (ROUTINE X 2): CULTURE: NO GROWTH

## 2016-12-19 ENCOUNTER — Encounter: Payer: Medicare Other | Admitting: Physical Therapy

## 2016-12-21 ENCOUNTER — Encounter: Payer: Medicare Other | Admitting: Physical Therapy

## 2016-12-28 ENCOUNTER — Ambulatory Visit (INDEPENDENT_AMBULATORY_CARE_PROVIDER_SITE_OTHER): Payer: Medicare Other | Admitting: Orthopedic Surgery

## 2017-01-04 ENCOUNTER — Ambulatory Visit (INDEPENDENT_AMBULATORY_CARE_PROVIDER_SITE_OTHER): Payer: Medicare Other | Admitting: Orthopedic Surgery

## 2017-01-05 ENCOUNTER — Encounter (HOSPITAL_COMMUNITY): Payer: Self-pay | Admitting: Orthopedic Surgery

## 2017-01-05 NOTE — OR Nursing (Signed)
Late entry for a patient in the room time correction.

## 2017-01-06 ENCOUNTER — Ambulatory Visit (INDEPENDENT_AMBULATORY_CARE_PROVIDER_SITE_OTHER): Payer: Medicare Other | Admitting: Family

## 2017-01-06 ENCOUNTER — Encounter (INDEPENDENT_AMBULATORY_CARE_PROVIDER_SITE_OTHER): Payer: Self-pay | Admitting: Orthopedic Surgery

## 2017-01-06 ENCOUNTER — Ambulatory Visit (INDEPENDENT_AMBULATORY_CARE_PROVIDER_SITE_OTHER): Payer: Medicare Other

## 2017-01-06 DIAGNOSIS — M25561 Pain in right knee: Secondary | ICD-10-CM | POA: Diagnosis not present

## 2017-01-06 DIAGNOSIS — IMO0002 Reserved for concepts with insufficient information to code with codable children: Secondary | ICD-10-CM

## 2017-01-06 DIAGNOSIS — E1152 Type 2 diabetes mellitus with diabetic peripheral angiopathy with gangrene: Secondary | ICD-10-CM

## 2017-01-06 MED ORDER — GABAPENTIN 100 MG PO CAPS: 100.0000 mg | ORAL_CAPSULE | Freq: Three times a day (TID) | ORAL | 0 refills | Status: AC

## 2017-01-06 MED ORDER — OXYCODONE-ACETAMINOPHEN 5-325 MG PO TABS: 1.0000 | ORAL_TABLET | Freq: Two times a day (BID) | ORAL | 0 refills | Status: DC | PRN

## 2017-01-06 MED ORDER — LIDOCAINE HCL 1 % IJ SOLN
5.0000 mL | INTRAMUSCULAR | Status: AC | PRN
  Administered 2017-01-06: 5 mL

## 2017-01-06 MED ORDER — METHYLPREDNISOLONE ACETATE 40 MG/ML IJ SUSP
40.0000 mg | INTRAMUSCULAR | Status: AC | PRN
  Administered 2017-01-06: 40 mg via INTRA_ARTICULAR

## 2017-01-06 NOTE — Progress Notes (Signed)
Office Visit Note   Patient: Derrick [name]           Date of Birth: 06/06/1971           MRN: [id number] Visit Date: 01/06/2017              Requested by: No referring provider defined for this encounter. PCP: Pcp Not In System  Chief Complaint  Patient presents with  . Right Leg - Pain    HPI: Patient is a 46 year old gentleman seen today for 2 separate issues. Is seen 3 weeks status post right below the knee amputation. This is healing well. Question why there is a "dog ear" laterally. This is tender. Also complaining of right knee pain and swelling. Global pain. No known injury. No similar pain or swelling in past. Aching and throbbing. Poor flexion, feels tight. No mechanical symptoms.    Assessment & Plan: Visit Diagnoses:  1. Diabetic wet gangrene of the foot (HCC)   2. Acute pain of right knee     Plan: Staples harvested today. Injection today. Will follow with [employer] for prosthesis. Follow up in office in 4 weeks.   Follow-Up Instructions: Return in about 4 weeks (around 02/03/2017).   Exam: Patient is alert and oriented. No adenopathy. Well-dressed. Normal affect. Respirations easy. Ambulatory in wheelchair today. Prosthesis in place to left residual limb.  Right Knee Exam   Tenderness  Right knee tenderness location: global tenderness.  Range of Motion  Flexion: 90   Tests  Varus: negative Valgus: negative  Other  Swelling: mild    No crepitation.  Incision is clean dry and intact. No surrounding erythema or sign of infection.   Imaging: No results found.  Orders:  Orders Placed This Encounter  Procedures  . XR Knee 1-2 Views Right   No orders of the defined types were placed in this encounter.    Procedures: Large Joint Inj Date/Time: 01/06/2017 10:38 AM Performed by: Adonis HugueninZAMORA, Jude Linck R Authorized by: Barnie DelZAMORA, Bettyjo Lundblad R   Consent Given by:  Patient Site marked: the procedure site was marked   Timeout: prior to procedure the correct patient,  procedure, and site was verified   Indications:  Pain and diagnostic evaluation Location:  Knee Site:  R knee Needle Size:  22 G Needle Length:  1.5 inches Ultrasound Guidance: No   Fluoroscopic Guidance: No   Arthrogram: No   Medications:  5 mL lidocaine 1 %; 40 mg methylPREDNISolone acetate 40 MG/ML Aspiration Attempted: No   Patient tolerance:  Patient tolerated the procedure well with no immediate complications    Clinical Data: No additional findings.  Subjective: Review of Systems  Constitutional: Negative for chills and fever.  Musculoskeletal: Positive for arthralgias and joint swelling.    Objective: Vital Signs: There were no vitals taken for this visit.  Specialty Comments:  No specialty comments available.  PMFS History: Patient Active Problem List   Diagnosis Date Noted  . Diabetic wet gangrene of the foot (HCC)   . Diabetic foot infection (HCC) 12/13/2016  . HTN (hypertension) 12/13/2016  . Sepsis (HCC) 12/13/2016  . Hypokalemia 12/13/2016  . Chronic osteomyelitis involving ankle and foot (HCC) 06/03/2012  . Syncope and collapse 04/12/2012  . Tobacco abuse 04/12/2012  . Cellulitis 02/25/2012  . Diabetic toe ulcer (HCC) 02/25/2012  . Diabetes mellitus (HCC) 02/25/2012  . Leukocytosis 02/25/2012   Past Medical History:  Diagnosis Date  . Diabetes mellitus   . Peripheral neuropathy (HCC)   .  Pneumonia   . Prostatitis     Family History  Problem Relation Age of Onset  . Cancer Mother   . Congestive Heart Failure Father     Past Surgical History:  Procedure Laterality Date  . AMPUTATION  05/31/2012   Procedure: AMPUTATION DIGIT;  Surgeon: Nadara Mustard, MD;  Location: [facility] OR;  Service: Orthopedics;  Laterality: Right;  right fourth and fifth toe ray amputation   . AMPUTATION Right 12/14/2016   Procedure: AMPUTATION BELOW KNEE RIGHT;  Surgeon: Nadara Mustard, MD;  Location: [facility] OR;  Service: Orthopedics;  Laterality: Right;  . I&D EXTREMITY   08/02/2012   Procedure: IRRIGATION AND DEBRIDEMENT EXTREMITY;  Surgeon: Nadara Mustard, MD;  Location: [facility] OR;  Service: Orthopedics;  Laterality: Right;  Right Foot Irrigation and Debridement, Place Antibiotic Beads  . SHOULDER SURGERY Bilateral   . STUMP REVISION Left 07/12/2016   Procedure: Revision Left Below Knee Amputation;  Surgeon: Nadara Mustard, MD;  Location: [facility] OR;  Service: Orthopedics;  Laterality: Left;  . TOE AMPUTATION     multiple toes amputated.   Social History   Occupational History  . Not on file.   Social History Main Topics  . Smoking status: Current Every Day Smoker    Packs/day: 2.00    Years: 24.00    Types: Cigarettes  . Smokeless tobacco: Never Used  . Alcohol use No  . Drug use: No  . Sexual activity: Yes    Birth control/ protection: None

## 2017-01-18 ENCOUNTER — Inpatient Hospital Stay (INDEPENDENT_AMBULATORY_CARE_PROVIDER_SITE_OTHER): Payer: Medicare Other | Admitting: Orthopedic Surgery

## 2017-01-19 ENCOUNTER — Telehealth (INDEPENDENT_AMBULATORY_CARE_PROVIDER_SITE_OTHER): Payer: Self-pay | Admitting: Orthopedic Surgery

## 2017-01-19 ENCOUNTER — Other Ambulatory Visit (INDEPENDENT_AMBULATORY_CARE_PROVIDER_SITE_OTHER): Payer: Self-pay | Admitting: Orthopedic Surgery

## 2017-01-19 MED ORDER — OXYCODONE-ACETAMINOPHEN 5-325 MG PO TABS: 1.0000 | ORAL_TABLET | Freq: Two times a day (BID) | ORAL | 0 refills | Status: DC | PRN

## 2017-01-19 NOTE — Telephone Encounter (Signed)
Prescription written for Percocet. 

## 2017-01-19 NOTE — Telephone Encounter (Signed)
Pt is a right BKA 12/14/16 he has percocet 5/325 11/28/16 #20 and 01/06/17 # 30 pt is asking for this to be changed from 5 mg to 10 mg  And for refill of medication.

## 2017-01-19 NOTE — Telephone Encounter (Signed)
CALLED TO GET REFILL ON PERCOCET. PATIENT CURRENTLY IN SURGERY. WANTING TO KNOW IF ITS OKAY TO BE PRESCRIBED 10 MG INSTEAD OF 5 MG CB # [phone]

## 2017-01-22 ENCOUNTER — Ambulatory Visit (INDEPENDENT_AMBULATORY_CARE_PROVIDER_SITE_OTHER): Payer: Medicare Other | Admitting: Orthopedic Surgery

## 2017-01-22 ENCOUNTER — Encounter (INDEPENDENT_AMBULATORY_CARE_PROVIDER_SITE_OTHER): Payer: Self-pay | Admitting: Orthopedic Surgery

## 2017-01-22 VITALS — Ht 73.0 in | Wt 200.0 lb

## 2017-01-22 DIAGNOSIS — T8740 Infection of amputation stump, unspecified extremity: Secondary | ICD-10-CM | POA: Insufficient documentation

## 2017-01-22 MED ORDER — DOXYCYCLINE HYCLATE 100 MG PO TABS: 100.0000 mg | ORAL_TABLET | Freq: Two times a day (BID) | ORAL | 0 refills | Status: DC

## 2017-01-22 NOTE — Progress Notes (Signed)
Office Visit Note   Patient: Derrick [name]           Date of Birth: October 16, 1971           MRN: [id number] Visit Date: 01/22/2017              Requested by: No referring provider defined for this encounter. PCP: Pcp Not In System  Chief Complaint  Patient presents with  . Right Knee - Wound Check    RT BKA 12/14/16     HPI: Patient presents today for right transtibial amputation on 12/14/16. He is approximately 5 weeks 4 days post op. There has been drainage and odor for three days. There is redness.     Assessment & Plan: Visit Diagnoses:  1. 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 A prescription for doxycycline was called in.  Follow-Up Instructions: Return in about 2 weeks (around 02/05/2017).   Ortho Exam On examination patient has purulent drainage and cellulitis over the lateral aspect of the right transtibial amputation.  Imaging: No results found.  Orders:  No orders of the defined types were placed in this encounter.  Meds ordered this encounter  Medications  . doxycycline (VIBRA-TABS) 100 MG tablet    Sig: Take 1 tablet (100 mg total) by mouth 2 (two) times daily.    Dispense:  14 tablet    Refill:  0     Procedures: No procedures performed  Clinical Data: No additional findings.  Subjective: Review of Systems  Objective: Vital Signs: Ht 6\' 1"  (1.854 m)   Wt 200 lb (90.7 kg)   BMI 26.39 kg/m   Specialty Comments:  No specialty comments available.  PMFS History: Patient Active Problem List   Diagnosis Date Noted  . 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 Below knee amputation status, right (HCC) 01/06/2017  . Diabetic wet gangrene of the foot (HCC)   . Diabetic foot infection (HCC) 12/13/2016  . HTN (hypertension) 12/13/2016  . Sepsis (HCC) 12/13/2016  . Hypokalemia 12/13/2016  . Chronic osteomyelitis involving ankle and foot (HCC)  06/03/2012  . Syncope and collapse 04/12/2012  . Tobacco abuse 04/12/2012  . Cellulitis 02/25/2012  . Diabetic toe ulcer (HCC) 02/25/2012  . Diabetes mellitus (HCC) 02/25/2012  . Leukocytosis 02/25/2012   Past Medical History:  Diagnosis Date  . Diabetes mellitus   . Peripheral neuropathy (HCC)   . Pneumonia   . Prostatitis     Family History  Problem Relation Age of Onset  . Cancer Mother   . Congestive Heart Failure Father     Past Surgical History:  Procedure Laterality Date  . AMPUTATION  05/31/2012   Procedure: AMPUTATION DIGIT;  Surgeon: Nadara Mustard, MD;  Location: [facility] OR;  Service: Orthopedics;  Laterality: Right;  right fourth and fifth toe ray amputation   . AMPUTATION Right 12/14/2016   Procedure: AMPUTATION BELOW KNEE RIGHT;  Surgeon: Nadara Mustard, MD;  Location: [facility] OR;  Service: Orthopedics;  Laterality: Right;  . I&D EXTREMITY  08/02/2012   Procedure: IRRIGATION AND DEBRIDEMENT EXTREMITY;  Surgeon: Nadara Mustard, MD;  Location: [facility] OR;  Service: Orthopedics;  Laterality: Right;  Right Foot Irrigation and Debridement, Place Antibiotic Beads  . SHOULDER SURGERY Bilateral   . STUMP REVISION Left 07/12/2016   Procedure: Revision Left Below Knee Amputation;  Surgeon: Nadara Mustard, MD;  Location: [facility] OR;  Service: Orthopedics;  Laterality: Left;  . TOE AMPUTATION     multiple toes amputated.   Social History   Occupational History  . Not on file.   Social History Main Topics  . Smoking status: Current Every Day Smoker    Packs/day: 2.00    Years: 24.00    Types: Cigarettes  . Smokeless tobacco: Never Used  . Alcohol use No  . Drug use: No  . Sexual activity: Yes    Birth control/ protection: None

## 2017-01-23 ENCOUNTER — Other Ambulatory Visit (INDEPENDENT_AMBULATORY_CARE_PROVIDER_SITE_OTHER): Payer: Self-pay | Admitting: Family

## 2017-01-23 ENCOUNTER — Encounter (HOSPITAL_COMMUNITY): Payer: Self-pay | Admitting: *Deleted

## 2017-01-23 ENCOUNTER — Telehealth (INDEPENDENT_AMBULATORY_CARE_PROVIDER_SITE_OTHER): Payer: Self-pay | Admitting: Orthopedic Surgery

## 2017-01-23 NOTE — Progress Notes (Signed)
Spoke with pt for pre-op call. Pt denies cardiac history, chest pain or sob. Pt is diabetic. Last A1C was 14.5 on 12/13/16. Pt states his fasting blood sugar is usually around 220 when he thinks about testing it. Instructed pt to take 1/2 of his regular dose of Lantus tonight (will take 10 units). Instructed him to check his blood sugar in the AM when he gets up and every 2 hours until he leaves for the hospital. If blood sugar is >220 take 1/2 of usual correction dose of Novolog insulin. If blood sugar is 70 or below, treat with 1/2 cup of clear juice (apple or cranberry) and recheck blood sugar 15 minutes after drinking juice. If blood sugar continues to be 70 or below, call the Short Stay department and ask to speak to a nurse. He voiced understanding. Pt wanted to make clear that he would not be taking his shorts off tomorrow for surgery. States he's not done in the past and he won't be doing it tomorrow.

## 2017-01-23 NOTE — Telephone Encounter (Signed)
Patient is wondering when his procedure will be set up.  [phone]

## 2017-01-24 ENCOUNTER — Ambulatory Visit (HOSPITAL_COMMUNITY)
Admission: RE | Admit: 2017-01-24 | Discharge: 2017-01-24 | Disposition: A | Discharge status: Home / Self Care | Payer: Medicare Other | Source: Ambulatory Visit | Attending: Orthopedic Surgery | Admitting: Orthopedic Surgery

## 2017-01-24 ENCOUNTER — Encounter (HOSPITAL_COMMUNITY): Payer: Self-pay | Admitting: Anesthesiology

## 2017-01-24 ENCOUNTER — Ambulatory Visit (HOSPITAL_COMMUNITY): Payer: Medicare Other | Admitting: Critical Care Medicine

## 2017-01-24 ENCOUNTER — Encounter (HOSPITAL_COMMUNITY)
Admission: RE | Disposition: A | Discharge status: Home / Self Care | Payer: Self-pay | Source: Ambulatory Visit | Attending: Orthopedic Surgery

## 2017-01-24 DIAGNOSIS — Z8249 Family history of ischemic heart disease and other diseases of the circulatory system: Secondary | ICD-10-CM | POA: Diagnosis not present

## 2017-01-24 DIAGNOSIS — Z9889 Other specified postprocedural states: Secondary | ICD-10-CM | POA: Insufficient documentation

## 2017-01-24 DIAGNOSIS — Z881 Allergy status to other antibiotic agents status: Secondary | ICD-10-CM | POA: Insufficient documentation

## 2017-01-24 DIAGNOSIS — Z89512 Acquired absence of left leg below knee: Secondary | ICD-10-CM | POA: Insufficient documentation

## 2017-01-24 DIAGNOSIS — T8743 Infection of amputation stump, right lower extremity: Secondary | ICD-10-CM | POA: Diagnosis present

## 2017-01-24 DIAGNOSIS — L02415 Cutaneous abscess of right lower limb: Secondary | ICD-10-CM | POA: Diagnosis not present

## 2017-01-24 DIAGNOSIS — E1142 Type 2 diabetes mellitus with diabetic polyneuropathy: Secondary | ICD-10-CM | POA: Insufficient documentation

## 2017-01-24 DIAGNOSIS — F17210 Nicotine dependence, cigarettes, uncomplicated: Secondary | ICD-10-CM | POA: Insufficient documentation

## 2017-01-24 DIAGNOSIS — Z89511 Acquired absence of right leg below knee: Secondary | ICD-10-CM | POA: Insufficient documentation

## 2017-01-24 DIAGNOSIS — T8753 Necrosis of amputation stump, right lower extremity: Secondary | ICD-10-CM | POA: Diagnosis not present

## 2017-01-24 DIAGNOSIS — E1151 Type 2 diabetes mellitus with diabetic peripheral angiopathy without gangrene: Secondary | ICD-10-CM | POA: Diagnosis not present

## 2017-01-24 DIAGNOSIS — B9689 Other specified bacterial agents as the cause of diseases classified elsewhere: Secondary | ICD-10-CM | POA: Insufficient documentation

## 2017-01-24 DIAGNOSIS — K219 Gastro-esophageal reflux disease without esophagitis: Secondary | ICD-10-CM | POA: Diagnosis not present

## 2017-01-24 DIAGNOSIS — Z79899 Other long term (current) drug therapy: Secondary | ICD-10-CM | POA: Insufficient documentation

## 2017-01-24 DIAGNOSIS — Y835 Amputation of limb(s) as the cause of abnormal reaction of the patient, or of later complication, without mention of misadventure at the time of the procedure: Secondary | ICD-10-CM | POA: Diagnosis not present

## 2017-01-24 DIAGNOSIS — Z794 Long term (current) use of insulin: Secondary | ICD-10-CM | POA: Diagnosis not present

## 2017-01-24 HISTORY — DX: Gastro-esophageal reflux disease without esophagitis: K21.9

## 2017-01-24 HISTORY — DX: Unspecified osteoarthritis, unspecified site: M19.90

## 2017-01-24 HISTORY — PX: STUMP REVISION: SHX6102

## 2017-01-24 HISTORY — DX: Anemia, unspecified: D64.9

## 2017-01-24 LAB — CBC
HEMATOCRIT: 40.3 % (ref 39.0–52.0)
Hemoglobin: 13.7 g/dL (ref 13.0–17.0)
MCH: 26.6 pg (ref 26.0–34.0)
MCHC: 34 g/dL (ref 30.0–36.0)
MCV: 78.3 fL (ref 78.0–100.0)
Platelets: 490 10*3/uL — ABNORMAL HIGH (ref 150–400)
RBC: 5.15 MIL/uL (ref 4.22–5.81)
RDW: 15.1 % (ref 11.5–15.5)
WBC: 27 10*3/uL — ABNORMAL HIGH (ref 4.0–10.5)

## 2017-01-24 LAB — GLUCOSE, CAPILLARY
GLUCOSE-CAPILLARY: 373 mg/dL — AB (ref 65–99)
Glucose-Capillary: 325 mg/dL — ABNORMAL HIGH (ref 65–99)
Glucose-Capillary: 253 mg/dL — ABNORMAL HIGH (ref 65–99)
Glucose-Capillary: 339 mg/dL — ABNORMAL HIGH (ref 65–99)

## 2017-01-24 LAB — BASIC METABOLIC PANEL
Anion gap: 14 (ref 5–15)
BUN: 10 mg/dL (ref 6–20)
CHLORIDE: 92 mmol/L — AB (ref 101–111)
CO2: 23 mmol/L (ref 22–32)
Calcium: 8.8 mg/dL — ABNORMAL LOW (ref 8.9–10.3)
Creatinine, Ser: 0.9 mg/dL (ref 0.61–1.24)
GFR calc Af Amer: >60 mL/min (ref >60)
GFR calc non Af Amer: >60 mL/min (ref >60)
GLUCOSE: 333 mg/dL — AB (ref 65–99)
POTASSIUM: 4.6 mmol/L (ref 3.5–5.1)
Sodium: 129 mmol/L — ABNORMAL LOW (ref 135–145)

## 2017-01-24 SURGERY — REVISION, AMPUTATION SITE
Anesthesia: General | Site: Leg Lower | Laterality: Right

## 2017-01-24 MED ORDER — CEFAZOLIN SODIUM-DEXTROSE 2-4 GM/100ML-% IV SOLN
2.0000 g | INTRAVENOUS | Status: AC
  Administered 2017-01-24: 2 g via INTRAVENOUS

## 2017-01-24 MED ORDER — MIDAZOLAM HCL 5 MG/5ML IJ SOLN
INTRAMUSCULAR | Status: DC | PRN
  Administered 2017-01-24: 2 mg via INTRAVENOUS

## 2017-01-24 MED ORDER — PROMETHAZINE HCL 25 MG/ML IJ SOLN: 6.2500 mg | INTRAMUSCULAR | Status: DC | PRN

## 2017-01-24 MED ORDER — CHLORHEXIDINE GLUCONATE 4 % EX LIQD: 60.0000 mL | Freq: Once | CUTANEOUS | Status: DC

## 2017-01-24 MED ORDER — CEFAZOLIN SODIUM-DEXTROSE 2-4 GM/100ML-% IV SOLN
INTRAVENOUS | Status: AC
  Filled 2017-01-24: qty 100

## 2017-01-24 MED ORDER — INSULIN REGULAR BOLUS VIA INFUSION
0.0000 [IU] | Freq: Three times a day (TID) | INTRAVENOUS | Status: DC
  Filled 2017-01-24: qty 10

## 2017-01-24 MED ORDER — PROPOFOL 10 MG/ML IV BOLUS
INTRAVENOUS | Status: AC
  Filled 2017-01-24: qty 20

## 2017-01-24 MED ORDER — DEXTROSE 50 % IV SOLN
25.0000 mL | INTRAVENOUS | Status: DC | PRN
  Filled 2017-01-24: qty 50

## 2017-01-24 MED ORDER — LACTATED RINGERS IV SOLN
INTRAVENOUS | Status: DC
  Administered 2017-01-24: 50 mL/h via INTRAVENOUS

## 2017-01-24 MED ORDER — PHENYLEPHRINE HCL 10 MG/ML IJ SOLN
INTRAMUSCULAR | Status: DC | PRN
  Administered 2017-01-24 (×2): 40 ug via INTRAVENOUS
  Administered 2017-01-24: 80 ug via INTRAVENOUS

## 2017-01-24 MED ORDER — 0.9 % SODIUM CHLORIDE (POUR BTL) OPTIME
TOPICAL | Status: DC | PRN
  Administered 2017-01-24: 1000 mL

## 2017-01-24 MED ORDER — SODIUM CHLORIDE 0.9 % IV SOLN
INTRAVENOUS | Status: DC
  Administered 2017-01-24: 11:00:00 via INTRAVENOUS

## 2017-01-24 MED ORDER — PROPOFOL 10 MG/ML IV BOLUS
INTRAVENOUS | Status: DC | PRN
  Administered 2017-01-24: 150 mg via INTRAVENOUS

## 2017-01-24 MED ORDER — ONDANSETRON HCL 4 MG/2ML IJ SOLN
INTRAMUSCULAR | Status: DC | PRN
  Administered 2017-01-24: 4 mg via INTRAVENOUS

## 2017-01-24 MED ORDER — MIDAZOLAM HCL 2 MG/2ML IJ SOLN
INTRAMUSCULAR | Status: AC
  Filled 2017-01-24: qty 2

## 2017-01-24 MED ORDER — SODIUM CHLORIDE 0.9 % IV SOLN
INTRAVENOUS | Status: DC
  Administered 2017-01-24: 3.1 [IU]/h via INTRAVENOUS
  Filled 2017-01-24: qty 2.5

## 2017-01-24 MED ORDER — HYDROMORPHONE HCL 1 MG/ML IJ SOLN: 0.2500 mg | INTRAMUSCULAR | Status: DC | PRN

## 2017-01-24 MED ORDER — HYDROMORPHONE HCL 1 MG/ML IJ SOLN
INTRAMUSCULAR | Status: AC
  Filled 2017-01-24: qty 1

## 2017-01-24 MED ORDER — FENTANYL CITRATE (PF) 100 MCG/2ML IJ SOLN
INTRAMUSCULAR | Status: DC | PRN
  Administered 2017-01-24: 25 ug via INTRAVENOUS

## 2017-01-24 MED ORDER — FENTANYL CITRATE (PF) 100 MCG/2ML IJ SOLN
INTRAMUSCULAR | Status: AC
  Filled 2017-01-24: qty 2

## 2017-01-24 MED ORDER — LIDOCAINE 2% (20 MG/ML) 5 ML SYRINGE
INTRAMUSCULAR | Status: DC | PRN
  Administered 2017-01-24: 100 mg via INTRAVENOUS

## 2017-01-24 SURGICAL SUPPLY — 28 items
BLADE SAW RECIP 87.9 MT (BLADE) IMPLANT
BLADE SURG 21 STRL SS (BLADE) ×3 IMPLANT
COVER SURGICAL LIGHT HANDLE (MISCELLANEOUS) ×3 IMPLANT
DRAPE EXTREMITY T 121X128X90 (DRAPE) ×3 IMPLANT
DRAPE INCISE IOBAN 66X45 STRL (DRAPES) ×3 IMPLANT
DRAPE PROXIMA HALF (DRAPES) ×3 IMPLANT
DRAPE U-SHAPE 47X51 STRL (DRAPES) ×6 IMPLANT
DRSG VAC ATS MED SENSATRAC (GAUZE/BANDAGES/DRESSINGS) ×3 IMPLANT
DURAPREP 26ML APPLICATOR (WOUND CARE) ×3 IMPLANT
ELECT REM PT RETURN 9FT ADLT (ELECTROSURGICAL) ×3
ELECTRODE REM PT RTRN 9FT ADLT (ELECTROSURGICAL) ×1 IMPLANT
GLOVE BIOGEL PI IND STRL 9 (GLOVE) ×1 IMPLANT
GLOVE BIOGEL PI INDICATOR 9 (GLOVE) ×2
GLOVE SURG ORTHO 9.0 STRL STRW (GLOVE) ×3 IMPLANT
GOWN STRL REUS W/ TWL XL LVL3 (GOWN DISPOSABLE) ×2 IMPLANT
GOWN STRL REUS W/TWL XL LVL3 (GOWN DISPOSABLE) ×6
KIT BASIN OR (CUSTOM PROCEDURE TRAY) ×3 IMPLANT
KIT ROOM TURNOVER OR (KITS) ×3 IMPLANT
MANIFOLD NEPTUNE II (INSTRUMENTS) ×3 IMPLANT
NS IRRIG 1000ML POUR BTL (IV SOLUTION) ×3 IMPLANT
PACK GENERAL/GYN (CUSTOM PROCEDURE TRAY) ×3 IMPLANT
PAD ARMBOARD 7.5X6 YLW CONV (MISCELLANEOUS) ×3 IMPLANT
PAD NEG PRESSURE SENSATRAC (MISCELLANEOUS) IMPLANT
STAPLER VISISTAT 35W (STAPLE) IMPLANT
SUT ETHILON 2 0 PSLX (SUTURE) ×6 IMPLANT
SUT SILK 2 0 (SUTURE)
SUT SILK 2-0 18XBRD TIE 12 (SUTURE) IMPLANT
TOWEL OR 17X26 10 PK STRL BLUE (TOWEL DISPOSABLE) ×3 IMPLANT

## 2017-01-24 NOTE — Anesthesia Procedure Notes (Addendum)
Procedure Name: LMA Insertion Date/Time: 01/24/2017 11:24 AM Performed by: Quentin OreWALKER, Joseangel Nettleton E Pre-anesthesia Checklist: Patient identified, Emergency Drugs available, Suction available and Patient being monitored Patient Re-evaluated:Patient Re-evaluated prior to inductionOxygen Delivery Method: Circle system utilized Preoxygenation: Pre-oxygenation with 100% oxygen Intubation Type: IV induction LMA: LMA inserted LMA Size: 4.0 Comments: Airway placed by Iline Ovenarey Warner, SRNA under supervision of MDA.

## 2017-01-24 NOTE — Op Note (Signed)
01/24/2017  11:55 AM  PATIENT:  Derrick [name]    PRE-OPERATIVE DIAGNOSIS:  Infected Right Below Knee Amputation  POST-OPERATIVE DIAGNOSIS:  Same  PROCEDURE:  Revision Right Below Knee Amputation  SURGEON:  Nadara MustardMarcus V Kaulin Chaves, MD  PHYSICIAN ASSISTANT:None ANESTHESIA:   General  PREOPERATIVE INDICATIONS:  Derrick [name] is a  46 y.o. male with a diagnosis of Infected Right Below Knee Amputation who failed conservative measures and elected for surgical management.    The risks benefits and alternatives were discussed with the patient preoperatively including but not limited to the risks of infection, bleeding, nerve injury, cardiopulmonary complications, the need for revision surgery, among others, and the patient was willing to proceed.  OPERATIVE IMPLANTS: None  OPERATIVE FINDINGS: Large lateral abscess cultures obtained 2  OPERATIVE PROCEDURE: Patient was brought to the operating room and underwent a general anesthetic. After adequate levels anesthesia were obtained patient's right lower extremity was prepped using DuraPrep draped into a sterile field a timeout was called. A fishmouth incision was made just proximal to the abscess. Deep cultures were obtained. Necrotic muscle was excised the distal 2 cm of the tibia and fibula were excised. This is debrided back to healthy viable muscle. The vascular bundles were suture ligated with 2-0 silk. Hemostasis was obtained. The wound was irrigated with normal saline. The deep and superficial fascial layers and skin was closed using 2-0 nylon. A sterile compressive dressing was applied. Patient refused treatment with a wound VAC. Plan for discharge to home. Patient has prescriptions for medication at home. He has two prescriptions for antibiotics and he will continue.

## 2017-01-24 NOTE — Anesthesia Preprocedure Evaluation (Addendum)
Anesthesia Evaluation  Patient identified by MRN, date of birth, ID band Patient awake    Reviewed: Allergy & Precautions, NPO status , Patient's Chart, lab work & pertinent test results  Airway Mallampati: II  TM Distance: >3 FB Neck ROM: Full    Dental  (+) Dental Advisory Given, Poor Dentition   Pulmonary Current Smoker,    Pulmonary exam normal breath sounds clear to auscultation       Cardiovascular hypertension, + Peripheral Vascular Disease  Normal cardiovascular exam Rhythm:Regular Rate:Normal     Neuro/Psych negative neurological ROS  negative psych ROS   GI/Hepatic negative GI ROS, Neg liver ROS,   Endo/Other  diabetes, Insulin Dependent  Renal/GU negative Renal ROS  negative genitourinary   Musculoskeletal negative musculoskeletal ROS (+)   Abdominal   Peds negative pediatric ROS (+)  Hematology negative hematology ROS (+) anemia ,   Anesthesia Other Findings Patient refuses to remove partial  Reproductive/Obstetrics negative OB ROS                             Anesthesia Physical Anesthesia Plan  ASA: III  Anesthesia Plan: General   Post-op Pain Management:    Induction: Intravenous  Airway Management Planned: LMA and Oral ETT  Additional Equipment:   Intra-op Plan:   Post-operative Plan: Extubation in OR  Informed Consent: I have reviewed the patients History and Physical, chart, labs and discussed the procedure including the risks, benefits and alternatives for the proposed anesthesia with the patient or authorized representative who has indicated his/her understanding and acceptance.     Plan Discussed with:   Anesthesia Plan Comments:         Anesthesia Quick Evaluation

## 2017-01-24 NOTE — Progress Notes (Signed)
Repeatedly removes monitoring equipment/ refrained from taking out IV, cursing  At stafff.

## 2017-01-24 NOTE — Anesthesia Postprocedure Evaluation (Addendum)
Anesthesia Post Note  Patient: Zuhair L Shorey  Procedure(s) Performed: Procedure(s) (LRB): Revision Right Below Knee Amputation (Right)  Patient location during evaluation: PACU Anesthesia Type: General Level of consciousness: awake and alert and awake Pain management: pain level controlled Vital Signs Assessment: post-procedure vital signs reviewed and stable Respiratory status: spontaneous breathing, nonlabored ventilation, respiratory function stable and patient connected to nasal cannula oxygen Cardiovascular status: blood pressure returned to baseline and stable Postop Assessment: no signs of nausea or vomiting Anesthetic complications: no       Last Vitals:  Vitals:   01/24/17 0939 01/24/17 1159  BP: 106/69   Pulse: (!) 111   Resp: 20   Temp: 36.7 C 37.3 C    Last Pain:  Vitals:   01/24/17 0957  TempSrc:   PainSc: 10-Worst pain ever                 Kirandeep Fariss,JAMES TERRILL

## 2017-01-24 NOTE — Progress Notes (Signed)
Spoke with dr Jacklynn Buemassagee re: current cbg 252/ refusal to keep iv intact and presenting + ongoing behavior. Ready to go per dr Jacklynn Buemassagee. Sieter retrieved from surg waiting room to bedside, She attributes his behavior to anesthesia , " he's always mean waking up" and pt states " you dropped my blood sugar to fast and I get ill".

## 2017-01-24 NOTE — Transfer of Care (Signed)
Immediate Anesthesia Transfer of Care Note  Patient: Derrick RoseBobby L Baena  Procedure(s) Performed: Procedure(s): Revision Right Below Knee Amputation (Right)  Patient Location: PACU  Anesthesia Type:General  Level of Consciousness: patient cooperative and responds to stimulation  Airway & Oxygen Therapy: Patient Spontanous Breathing and Patient connected to face mask oxygen  Post-op Assessment: Report given to RN, Post -op Vital signs reviewed and stable and Patient moving all extremities X 4  Post vital signs: Reviewed and stable  Last Vitals:  Vitals:   01/24/17 0939  BP: 106/69  Pulse: (!) 111  Resp: 20  Temp: 36.7 C    Last Pain:  Vitals:   01/24/17 0957  TempSrc:   PainSc: 10-Worst pain ever      Patients Stated Pain Goal: 3 (01/24/17 0957)  Complications: No apparent anesthesia complications

## 2017-01-24 NOTE — H&P (Signed)
Derrick [name] is an 46 y.o. male.   Chief Complaint: Cellulitis abscess right transtibial amputation HPI: Patient is a 46 year old gentleman with severe peripheral vascular disease diabetes status post transtibial amputation who presents with acute cellulitis abscess purulent drainage lateral aspect right transtibial amputation.  Past Medical History:  Diagnosis Date  . Anemia   . Arthritis   . Diabetes mellitus   . GERD (gastroesophageal reflux disease)   . Peripheral neuropathy (HCC)   . Pneumonia   . Prostatitis     Past Surgical History:  Procedure Laterality Date  . AMPUTATION  05/31/2012   Procedure: AMPUTATION DIGIT;  Surgeon: Nadara MustardMarcus V Thom Ollinger, MD;  [facility];  Service: Orthopedics;  Laterality: Right;  right fourth and fifth toe ray amputation   . AMPUTATION Right 12/14/2016   Procedure: AMPUTATION BELOW KNEE RIGHT;  Surgeon: Nadara MustardMarcus Maitri Schnoebelen V, MD;  Location: [facility] OR;  Service: Orthopedics;  Laterality: Right;  . I&D EXTREMITY  08/02/2012   Procedure: IRRIGATION AND DEBRIDEMENT EXTREMITY;  Surgeon: Nadara MustardMarcus V Bonny Vanleeuwen, MD;  Location: [facility] OR;  Service: Orthopedics;  Laterality: Right;  Right Foot Irrigation and Debridement, Place Antibiotic Beads  . SHOULDER SURGERY Bilateral   . STUMP REVISION Left 07/12/2016   Procedure: Revision Left Below Knee Amputation;  Surgeon: Nadara MustardMarcus V Tyria Springer, MD;  Location: [facility] OR;  Service: Orthopedics;  Laterality: Left;  . TOE AMPUTATION     multiple toes amputated.    Family History  Problem Relation Age of Onset  . Cancer Mother   . Congestive Heart Failure Father    Social History:  reports that he has been smoking Cigarettes.  He has a 48.00 pack-year smoking history. He has never used smokeless tobacco. He reports that he does not drink alcohol or use drugs.  Allergies:  Allergies  Allergen Reactions  . Vancomycin Other (See Comments)    Acute kidney injury    No prescriptions prior to admission.    No results found for this or any previous  visit (from the past 48 hour(s)). No results found.  Review of Systems  All other systems reviewed and are negative.   There were no vitals taken for this visit. Physical Exam  On examination patient is alert oriented no adenopathy well-dressed normal affect divorced wear for he has an antalgic gait. Examination he has redness cellulitis purulent drainage in the lateral aspect of the right transtibial amputation. Assessment/Plan Assessment: Diabetic insensate neuropathy with peripheral vascular disease with abscess cellulitis right transtibial amputation.  Plan: We'll plan for revision of the transtibial amputation. Risk and benefits were discussed including persistent infection need for higher level amputation. Patient states he understands wish to proceed at this time.  Nadara MustardMarcus V Aniello Christopoulos, MD 01/24/2017, 7:04 AM

## 2017-01-25 ENCOUNTER — Encounter (HOSPITAL_COMMUNITY): Payer: Self-pay | Admitting: Orthopedic Surgery

## 2017-01-30 ENCOUNTER — Other Ambulatory Visit (INDEPENDENT_AMBULATORY_CARE_PROVIDER_SITE_OTHER): Payer: Self-pay | Admitting: Orthopedic Surgery

## 2017-01-30 ENCOUNTER — Ambulatory Visit (INDEPENDENT_AMBULATORY_CARE_PROVIDER_SITE_OTHER): Payer: Medicare Other | Admitting: Orthopedic Surgery

## 2017-01-30 ENCOUNTER — Encounter (HOSPITAL_COMMUNITY): Payer: Self-pay | Admitting: *Deleted

## 2017-01-30 DIAGNOSIS — T8781 Dehiscence of amputation stump: Secondary | ICD-10-CM

## 2017-01-30 LAB — ANAEROBIC CULTURE

## 2017-01-30 NOTE — Progress Notes (Signed)
Office Visit Note   Patient: Derrick [name]           Date of Birth: 1971-08-26           MRN: [id number] Visit Date: 01/30/2017              Requested by: No referring provider defined for this encounter. PCP: Pcp Not In System  Chief Complaint  Patient presents with  . Right Leg - Routine Post Op    HPI: Follow up after below right knee amputation on 01/24/17.  Patient complains of pain, swelling, red with fever to touch, foul smelling odor.  Patient also complains of not sleeping or eating.    Patient is still smoking complains of a foul-smelling odor from the drainage complains of cellulitis. He is still on oral antibiotics.  Assessment & Plan: Visit Diagnoses:  1. Dehiscence of amputation stump [facility])     Plan: We'll plan for an above-the-knee amputation on the right. Discussed with patient with his cellulitis extending to the mid thigh that he is not a candidate for any type of salvage for the transtibial amputation. Discussed that the patient may still require repeat irrigation debridement there is any purulence extending proximal to the knee.  Follow-Up Instructions: Return in about 2 weeks (around 02/13/2017).   Ortho Exam Examination patient has cellulitis and induration and pain to palpation just proximal to the knee joint posterior laterally. There is purulent drainage from the amputation stump.  Imaging: No results found.  Orders:  No orders of the defined types were placed in this encounter.  No orders of the defined types were placed in this encounter.    Procedures: No procedures performed  Clinical Data: No additional findings.  Subjective: Review of Systems  Objective: Vital Signs: There were no vitals taken for this visit.  Specialty Comments:  No specialty comments available.  PMFS History: Patient Active Problem List   Diagnosis Date Noted  . Dehiscence of amputation stump (HCC) 01/30/2017  . Abscess of right leg   . Infection of  below knee amputation stump (HCC) 01/22/2017  . Below knee amputation status, right (HCC) 01/06/2017  . Diabetic wet gangrene of the foot (HCC)   . Diabetic foot infection (HCC) 12/13/2016  . HTN (hypertension) 12/13/2016  . Sepsis (HCC) 12/13/2016  . Hypokalemia 12/13/2016  . Chronic osteomyelitis involving ankle and foot (HCC) 06/03/2012  . Syncope and collapse 04/12/2012  . Tobacco abuse 04/12/2012  . Cellulitis 02/25/2012  . Diabetic toe ulcer (HCC) 02/25/2012  . Diabetes mellitus (HCC) 02/25/2012  . Leukocytosis 02/25/2012   Past Medical History:  Diagnosis Date  . Anemia   . Arthritis   . Diabetes mellitus   . GERD (gastroesophageal reflux disease)   . Peripheral neuropathy (HCC)   . Pneumonia   . Prostatitis     Family History  Problem Relation Age of Onset  . Cancer Mother   . Congestive Heart Failure Father     Past Surgical History:  Procedure Laterality Date  . AMPUTATION  05/31/2012   Procedure: AMPUTATION DIGIT;  Surgeon: Nadara Mustard, MD;  Location: [facility] OR;  Service: Orthopedics;  Laterality: Right;  right fourth and fifth toe ray amputation   . AMPUTATION Right 12/14/2016   Procedure: AMPUTATION BELOW KNEE RIGHT;  Surgeon: Nadara Mustard, MD;  [facility];  Service: Orthopedics;  Laterality: Right;  . I&D EXTREMITY  08/02/2012   Procedure: IRRIGATION AND DEBRIDEMENT EXTREMITY;  Surgeon: Nadara Mustard, MD;  [facility];  Service: Orthopedics;  Laterality: Right;  Right Foot Irrigation and Debridement, Place Antibiotic Beads  . SHOULDER SURGERY Bilateral   . STUMP REVISION Left 07/12/2016   Procedure: Revision Left Below Knee Amputation;  Surgeon: Nadara MustardMarcus V Lacrystal Barbe, MD;  Location[facility] OR;  Service: Orthopedics;  Laterality: Left;  . STUMP REVISION Right 01/24/2017   Procedure: Revision Right Below Knee Amputation;  Surgeon: Nadara MustardMarcus Kataleena Holsapple V, MD;  [facility];  Service: Orthopedics;  Laterality: Right;  . TOE AMPUTATION     multiple toes amputated.   Social  History   Occupational History  . Not on file.   Social History Main Topics  . Smoking status: Current Every Day Smoker    Packs/day: 2.00    Years: 24.00    Types: Cigarettes  . Smokeless tobacco: Never Used  . Alcohol use No  . Drug use: No  . Sexual activity: Yes    Birth control/ protection: None

## 2017-01-30 NOTE — Progress Notes (Signed)
Mr Derrick [name] denies any cardiac history, Chest pain or shortness of breath. Patient states he rarely checks his blood sugar, "the last time it was checked was when he was in the hospital last week."  I encouraged patient to check it tomorrow and if it is greater than 220 to take 1/2 dose of SS Insulin, patient's response was, "I probably won't."   I talked with patient about wound healing an what if CBG is too high that OR could be cancelled.  Patient responded to me, who told you that lie, my blood sugar was 375 last week and I went to surgery."

## 2017-01-31 ENCOUNTER — Encounter (HOSPITAL_COMMUNITY)
Admission: RE | Disposition: A | Discharge status: Home / Self Care | Payer: Self-pay | Source: Ambulatory Visit | Attending: Orthopedic Surgery

## 2017-01-31 ENCOUNTER — Inpatient Hospital Stay (HOSPITAL_COMMUNITY): Payer: Medicare Other | Admitting: Anesthesiology

## 2017-01-31 ENCOUNTER — Ambulatory Visit (HOSPITAL_COMMUNITY)
Admission: RE | Admit: 2017-01-31 | Discharge: 2017-01-31 | Disposition: A | Discharge status: Home / Self Care | Payer: Medicare Other | Source: Ambulatory Visit | Attending: Orthopedic Surgery | Admitting: Orthopedic Surgery

## 2017-01-31 ENCOUNTER — Encounter (HOSPITAL_COMMUNITY): Payer: Self-pay | Admitting: Surgery

## 2017-01-31 DIAGNOSIS — B951 Streptococcus, group B, as the cause of diseases classified elsewhere: Secondary | ICD-10-CM | POA: Diagnosis not present

## 2017-01-31 DIAGNOSIS — Z8249 Family history of ischemic heart disease and other diseases of the circulatory system: Secondary | ICD-10-CM | POA: Diagnosis not present

## 2017-01-31 DIAGNOSIS — Z881 Allergy status to other antibiotic agents status: Secondary | ICD-10-CM | POA: Insufficient documentation

## 2017-01-31 DIAGNOSIS — Z89511 Acquired absence of right leg below knee: Secondary | ICD-10-CM | POA: Insufficient documentation

## 2017-01-31 DIAGNOSIS — Z794 Long term (current) use of insulin: Secondary | ICD-10-CM | POA: Diagnosis not present

## 2017-01-31 DIAGNOSIS — X58XXXA Exposure to other specified factors, initial encounter: Secondary | ICD-10-CM | POA: Insufficient documentation

## 2017-01-31 DIAGNOSIS — F17210 Nicotine dependence, cigarettes, uncomplicated: Secondary | ICD-10-CM | POA: Diagnosis not present

## 2017-01-31 DIAGNOSIS — E1142 Type 2 diabetes mellitus with diabetic polyneuropathy: Secondary | ICD-10-CM | POA: Insufficient documentation

## 2017-01-31 DIAGNOSIS — Z9889 Other specified postprocedural states: Secondary | ICD-10-CM | POA: Diagnosis not present

## 2017-01-31 DIAGNOSIS — T8743 Infection of amputation stump, right lower extremity: Secondary | ICD-10-CM | POA: Insufficient documentation

## 2017-01-31 DIAGNOSIS — K219 Gastro-esophageal reflux disease without esophagitis: Secondary | ICD-10-CM | POA: Insufficient documentation

## 2017-01-31 DIAGNOSIS — Z79899 Other long term (current) drug therapy: Secondary | ICD-10-CM | POA: Insufficient documentation

## 2017-01-31 DIAGNOSIS — T8781 Dehiscence of amputation stump: Secondary | ICD-10-CM

## 2017-01-31 HISTORY — DX: Polyneuropathy, unspecified: G62.9

## 2017-01-31 HISTORY — PX: AMPUTATION: SHX166

## 2017-01-31 LAB — GLUCOSE, CAPILLARY
GLUCOSE-CAPILLARY: 267 mg/dL — AB (ref 65–99)
Glucose-Capillary: 328 mg/dL — ABNORMAL HIGH (ref 65–99)
Glucose-Capillary: 264 mg/dL — ABNORMAL HIGH (ref 65–99)

## 2017-01-31 SURGERY — AMPUTATION, ABOVE KNEE
Anesthesia: General | Site: Leg Upper | Laterality: Right

## 2017-01-31 MED ORDER — PHENYLEPHRINE HCL 10 MG/ML IJ SOLN
INTRAMUSCULAR | Status: DC | PRN
  Administered 2017-01-31 (×2): 160 ug via INTRAVENOUS
  Administered 2017-01-31: 120 ug via INTRAVENOUS
  Administered 2017-01-31: 160 ug via INTRAVENOUS
  Administered 2017-01-31: 80 ug via INTRAVENOUS
  Administered 2017-01-31: 120 ug via INTRAVENOUS
  Administered 2017-01-31: 80 ug via INTRAVENOUS

## 2017-01-31 MED ORDER — CHLORHEXIDINE GLUCONATE 4 % EX LIQD: 60.0000 mL | Freq: Once | CUTANEOUS | Status: DC

## 2017-01-31 MED ORDER — ONDANSETRON HCL 4 MG/2ML IJ SOLN
INTRAMUSCULAR | Status: DC | PRN
  Administered 2017-01-31: 4 mg via INTRAVENOUS

## 2017-01-31 MED ORDER — DEXMEDETOMIDINE HCL IN NACL 200 MCG/50ML IV SOLN
INTRAVENOUS | Status: AC
  Filled 2017-01-31: qty 50

## 2017-01-31 MED ORDER — PHENYLEPHRINE 40 MCG/ML (10ML) SYRINGE FOR IV PUSH (FOR BLOOD PRESSURE SUPPORT)
PREFILLED_SYRINGE | INTRAVENOUS | Status: AC
  Filled 2017-01-31: qty 30

## 2017-01-31 MED ORDER — FENTANYL CITRATE (PF) 100 MCG/2ML IJ SOLN
INTRAMUSCULAR | Status: DC | PRN
  Administered 2017-01-31 (×2): 100 ug via INTRAVENOUS

## 2017-01-31 MED ORDER — OXYCODONE-ACETAMINOPHEN 5-325 MG PO TABS: 1.0000 | ORAL_TABLET | ORAL | 0 refills | Status: DC | PRN

## 2017-01-31 MED ORDER — LACTATED RINGERS IV SOLN
INTRAVENOUS | Status: DC
  Administered 2017-01-31 (×2): via INTRAVENOUS

## 2017-01-31 MED ORDER — MEPERIDINE HCL 25 MG/ML IJ SOLN: 6.2500 mg | INTRAMUSCULAR | Status: DC | PRN

## 2017-01-31 MED ORDER — HYDROMORPHONE HCL 1 MG/ML IJ SOLN
INTRAMUSCULAR | Status: AC
  Filled 2017-01-31: qty 1

## 2017-01-31 MED ORDER — INSULIN ASPART 100 UNIT/ML IV SOLN
10.0000 [IU] | INTRAVENOUS | Status: AC
  Administered 2017-01-31: 10 [IU] via INTRAVENOUS
  Filled 2017-01-31: qty 0.1

## 2017-01-31 MED ORDER — LIDOCAINE HCL (CARDIAC) 20 MG/ML IV SOLN
INTRAVENOUS | Status: DC | PRN
  Administered 2017-01-31: 100 mg via INTRAVENOUS

## 2017-01-31 MED ORDER — CEFAZOLIN SODIUM-DEXTROSE 2-4 GM/100ML-% IV SOLN
2.0000 g | INTRAVENOUS | Status: AC
  Administered 2017-01-31: 2 g via INTRAVENOUS
  Filled 2017-01-31: qty 100

## 2017-01-31 MED ORDER — 0.9 % SODIUM CHLORIDE (POUR BTL) OPTIME
TOPICAL | Status: DC | PRN
  Administered 2017-01-31: 1000 mL

## 2017-01-31 MED ORDER — PROPOFOL 10 MG/ML IV BOLUS
INTRAVENOUS | Status: DC | PRN
  Administered 2017-01-31: 180 mg via INTRAVENOUS

## 2017-01-31 MED ORDER — MIDAZOLAM HCL 2 MG/2ML IJ SOLN
INTRAMUSCULAR | Status: AC
  Filled 2017-01-31: qty 2

## 2017-01-31 MED ORDER — SODIUM CHLORIDE 0.9 % IR SOLN
Status: DC | PRN
  Administered 2017-01-31: 3000 mL

## 2017-01-31 MED ORDER — MIDAZOLAM HCL 5 MG/5ML IJ SOLN
INTRAMUSCULAR | Status: DC | PRN
  Administered 2017-01-31: 2 mg via INTRAVENOUS

## 2017-01-31 MED ORDER — HYDROMORPHONE HCL 1 MG/ML IJ SOLN
0.5000 mg | INTRAMUSCULAR | Status: AC
  Administered 2017-01-31: 1 mg via INTRAVENOUS
  Filled 2017-01-31: qty 1

## 2017-01-31 MED ORDER — SUCCINYLCHOLINE CHLORIDE 20 MG/ML IJ SOLN
INTRAMUSCULAR | Status: DC | PRN
  Administered 2017-01-31: 120 mg via INTRAVENOUS

## 2017-01-31 MED ORDER — HYDROMORPHONE HCL 1 MG/ML IJ SOLN
INTRAMUSCULAR | Status: AC
  Administered 2017-01-31: 1 mg via INTRAVENOUS
  Filled 2017-01-31: qty 1

## 2017-01-31 MED ORDER — DEXMEDETOMIDINE HCL 200 MCG/2ML IV SOLN
INTRAVENOUS | Status: DC | PRN
  Administered 2017-01-31 (×2): 12 ug via INTRAVENOUS

## 2017-01-31 MED ORDER — INSULIN ASPART 100 UNIT/ML ~~LOC~~ SOLN
SUBCUTANEOUS | Status: AC
  Administered 2017-01-31: 6 [IU]
  Filled 2017-01-31: qty 1

## 2017-01-31 MED ORDER — INSULIN ASPART 100 UNIT/ML ~~LOC~~ SOLN: 6.0000 [IU] | Freq: Once | SUBCUTANEOUS | Status: DC

## 2017-01-31 MED ORDER — METOCLOPRAMIDE HCL 5 MG/ML IJ SOLN: 10.0000 mg | Freq: Once | INTRAMUSCULAR | Status: DC | PRN

## 2017-01-31 MED ORDER — ROCURONIUM BROMIDE 100 MG/10ML IV SOLN
INTRAVENOUS | Status: DC | PRN
  Administered 2017-01-31: 10 mg via INTRAVENOUS

## 2017-01-31 MED ORDER — HYDROMORPHONE HCL 1 MG/ML IJ SOLN
0.2500 mg | INTRAMUSCULAR | Status: DC | PRN
  Administered 2017-01-31 (×2): 0.5 mg via INTRAVENOUS

## 2017-01-31 SURGICAL SUPPLY — 47 items
BLADE SAW RECIP 87.9 MT (BLADE) ×3 IMPLANT
BLADE SURG 21 STRL SS (BLADE) ×6 IMPLANT
BNDG COHESIVE 4X5 TAN STRL (GAUZE/BANDAGES/DRESSINGS) ×2 IMPLANT
BNDG COHESIVE 6X5 TAN STRL LF (GAUZE/BANDAGES/DRESSINGS) ×4 IMPLANT
BNDG GAUZE ELAST 4 BULKY (GAUZE/BANDAGES/DRESSINGS) ×3 IMPLANT
COVER SURGICAL LIGHT HANDLE (MISCELLANEOUS) ×6 IMPLANT
CUFF TOURNIQUET SINGLE 34IN LL (TOURNIQUET CUFF) IMPLANT
CUFF TOURNIQUET SINGLE 44IN (TOURNIQUET CUFF) IMPLANT
DRAIN PENROSE 1/2X12 LTX STRL (WOUND CARE) IMPLANT
DRAPE HALF SHEET 40X57 (DRAPES) ×3 IMPLANT
DRAPE PROXIMA HALF (DRAPES) ×3 IMPLANT
DRAPE U-SHAPE 47X51 STRL (DRAPES) ×6 IMPLANT
DRSG ADAPTIC 3X8 NADH LF (GAUZE/BANDAGES/DRESSINGS) ×3 IMPLANT
DRSG PAD ABDOMINAL 8X10 ST (GAUZE/BANDAGES/DRESSINGS) ×4 IMPLANT
DURAPREP 26ML APPLICATOR (WOUND CARE) ×3 IMPLANT
ELECT CAUTERY BLADE 6.4 (BLADE) IMPLANT
ELECT REM PT RETURN 9FT ADLT (ELECTROSURGICAL) ×3
ELECTRODE REM PT RTRN 9FT ADLT (ELECTROSURGICAL) ×1 IMPLANT
EVACUATOR 1/8 PVC DRAIN (DRAIN) IMPLANT
GAUZE SPONGE 4X4 12PLY STRL (GAUZE/BANDAGES/DRESSINGS) ×3 IMPLANT
GLOVE BIOGEL PI IND STRL 9 (GLOVE) ×1 IMPLANT
GLOVE BIOGEL PI INDICATOR 9 (GLOVE) ×2
GLOVE SURG ORTHO 9.0 STRL STRW (GLOVE) ×3 IMPLANT
GOWN STRL REUS W/ TWL XL LVL3 (GOWN DISPOSABLE) ×2 IMPLANT
GOWN STRL REUS W/TWL XL LVL3 (GOWN DISPOSABLE) ×6
HANDPIECE INTERPULSE COAX TIP (DISPOSABLE) ×3
KIT BASIN OR (CUSTOM PROCEDURE TRAY) ×3 IMPLANT
KIT ROOM TURNOVER OR (KITS) ×3 IMPLANT
MANIFOLD NEPTUNE II (INSTRUMENTS) ×3 IMPLANT
NS IRRIG 1000ML POUR BTL (IV SOLUTION) ×3 IMPLANT
PACK GENERAL/GYN (CUSTOM PROCEDURE TRAY) ×3 IMPLANT
PAD ARMBOARD 7.5X6 YLW CONV (MISCELLANEOUS) ×3 IMPLANT
SET HNDPC FAN SPRY TIP SCT (DISPOSABLE) IMPLANT
SPONGE GAUZE 4X4 12PLY STER LF (GAUZE/BANDAGES/DRESSINGS) ×2 IMPLANT
STAPLER VISISTAT 35W (STAPLE) IMPLANT
STOCKINETTE IMPERVIOUS LG (DRAPES) IMPLANT
SUT ETHILON 2 0 PSLX (SUTURE) ×7 IMPLANT
SUT PDS AB 1 CT  36 (SUTURE)
SUT PDS AB 1 CT 36 (SUTURE) IMPLANT
SUT SILK 2 0 (SUTURE) ×3
SUT SILK 2-0 18XBRD TIE 12 (SUTURE) ×1 IMPLANT
SUT VIC AB 1 CTX 27 (SUTURE) ×3 IMPLANT
SWAB COLLECTION DEVICE MRSA (MISCELLANEOUS) IMPLANT
TOWEL OR 17X24 6PK STRL BLUE (TOWEL DISPOSABLE) ×3 IMPLANT
TOWEL OR 17X26 10 PK STRL BLUE (TOWEL DISPOSABLE) ×3 IMPLANT
TUBE ANAEROBIC SPECIMEN COL (MISCELLANEOUS) IMPLANT
WATER STERILE IRR 1000ML POUR (IV SOLUTION) ×3 IMPLANT

## 2017-01-31 NOTE — Anesthesia Preprocedure Evaluation (Signed)
Anesthesia Evaluation  Patient identified by MRN, date of birth, ID band Patient awake    Reviewed: Allergy & Precautions, NPO status , Patient's Chart, lab work & pertinent test results  Airway Mallampati: II  TM Distance: >3 FB Neck ROM: Full    Dental  (+) Partial Lower, Poor Dentition   Pulmonary pneumonia, resolved, Current Smoker,    Pulmonary exam normal breath sounds clear to auscultation       Cardiovascular hypertension, Pt. on medications Normal cardiovascular exam Rhythm:Regular Rate:Normal     Neuro/Psych Diabetic neuropathy  Neuromuscular disease    GI/Hepatic GERD  Medicated and Controlled,  Endo/Other  diabetes, Poorly Controlled, Type 2, Insulin Dependent, Oral Hypoglycemic AgentsNon compliant with Insulin  Renal/GU    Hx/o Prostatitis    Musculoskeletal  (+) Arthritis , Osteoarthritis,  Infected right BKA with wound dehiscence   Abdominal   Peds  Hematology  (+) anemia , Sepsis   Anesthesia Other Findings   Reproductive/Obstetrics                             Anesthesia Physical Anesthesia Plan  ASA: III  Anesthesia Plan: General   Post-op Pain Management:    Induction: Intravenous  Airway Management Planned: Oral ETT  Additional Equipment:   Intra-op Plan:   Post-operative Plan: Extubation in OR  Informed Consent: I have reviewed the patients History and Physical, chart, labs and discussed the procedure including the risks, benefits and alternatives for the proposed anesthesia with the patient or authorized representative who has indicated his/her understanding and acceptance.   Dental advisory given  Plan Discussed with: Anesthesiologist, CRNA and Surgeon  Anesthesia Plan Comments:         Anesthesia Quick Evaluation

## 2017-01-31 NOTE — Progress Notes (Signed)
Pt took monitor leads o2 sat probe and bp cuff off refused to have put back on pt alert stable color good will continue to monitor observe at bedside

## 2017-01-31 NOTE — Progress Notes (Signed)
Consulted with dr foster regarding pts bs orders obtained and carried out

## 2017-01-31 NOTE — Anesthesia Procedure Notes (Signed)
Procedure Name: Intubation Date/Time: 01/31/2017 11:32 AM Performed by: Rosiland OzMEYERS, Ellorie Kindall Pre-anesthesia Checklist: Patient identified, Emergency Drugs available, Suction available and Patient being monitored Patient Re-evaluated:Patient Re-evaluated prior to inductionOxygen Delivery Method: Circle System Utilized Preoxygenation: Pre-oxygenation with 100% oxygen Intubation Type: IV induction Ventilation: Mask ventilation without difficulty Laryngoscope Size: Miller and 3 Grade View: Grade I Tube type: Oral Tube size: 7.5 mm Number of attempts: 1 Airway Equipment and Method: Stylet and Oral airway Placement Confirmation: ETT inserted through vocal cords under direct vision,  positive ETCO2 and breath sounds checked- equal and bilateral Tube secured with: Tape Dental Injury: Teeth and Oropharynx as per pre-operative assessment

## 2017-01-31 NOTE — H&P (Signed)
Derrick [name] is an 46 y.o. male.   Chief Complaint: Cellulitis abscess foul-smelling purulent drainage right transtibial amputation HPI: Patient is a 46 year old gentleman who is status post a left transtibial amputation who has undergone multiple attempts for salvage of a right transtibial amputation. Despite patient's insistence on salvage of the transtibial amputation patient is still smoking and has a foul-smelling purulent drainage with cellulitis extending to mid thigh.  Past Medical History:  Diagnosis Date  . Anemia   . Arthritis   . Diabetes mellitus    Type II  . GERD (gastroesophageal reflux disease)   . Neuropathy (HCC)   . Peripheral neuropathy (HCC)   . Pneumonia   . Prostatitis     Past Surgical History:  Procedure Laterality Date  . AMPUTATION  05/31/2012   Procedure: AMPUTATION DIGIT;  Surgeon: Nadara Mustard, MD;  Location: [facility] OR;  Service: Orthopedics;  Laterality: Right;  right fourth and fifth toe ray amputation   . AMPUTATION Right 12/14/2016   Procedure: AMPUTATION BELOW KNEE RIGHT;  Surgeon: Nadara Mustard, MD;  Location: [facility] OR;  Service: Orthopedics;  Laterality: Right;  . I&D EXTREMITY  08/02/2012   Procedure: IRRIGATION AND DEBRIDEMENT EXTREMITY;  Surgeon: Nadara Mustard, MD;  Location: [facility] OR;  Service: Orthopedics;  Laterality: Right;  Right Foot Irrigation and Debridement, Place Antibiotic Beads  . SHOULDER SURGERY Bilateral    rotarcuff  . STUMP REVISION Left 07/12/2016   Procedure: Revision Left Below Knee Amputation;  Surgeon: Nadara Mustard, MD;  Location: [facility] OR;  Service: Orthopedics;  Laterality: Left;  . STUMP REVISION Right 01/24/2017   Procedure: Revision Right Below Knee Amputation;  Surgeon: Nadara Mustard, MD;  Location: [facility] OR;  Service: Orthopedics;  Laterality: Right;  . TOE AMPUTATION     multiple toes amputated.    Family History  Problem Relation Age of Onset  . Cancer Mother   . Congestive Heart Failure Father    Social History:   reports that he has been smoking Cigarettes.  He has a 48.00 pack-year smoking history. He has never used smokeless tobacco. He reports that he does not drink alcohol or use drugs.  Allergies:  Allergies  Allergen Reactions  . Vancomycin Other (See Comments)    Acute kidney injury    No prescriptions prior to admission.    No results found for this or any previous visit (from the past 48 hour(s)). No results found.  Review of Systems  All other systems reviewed and are negative.  Patient is still actively smoking despite repeated medical advice regarding smoking cessation. There were no vitals taken for this visit. Physical Exam  On examination patient is alert oriented no adenopathy well-dressed normal affect normal respiratory effort he relates a wheelchair. Examination he is foul-smelling purulent drainage from the right transtibial amputation there is induration up to the popliteal fossa there is cellulitis to mid thigh. Patient is exquisitely tender to light touch. Assessment: Progressive infection right transtibial amputation Assessment/Plan Assessment: Diabetic insensate neuropathy with dehiscence purulent abscess right transtibial amputation.  Plan: Discussed with the patient that his only options were then above-the-knee amputation. Discussed patient is at risk of life with pursuing further limb salvage of the transtibial amputation. Discussed that we may have to require two-stage procedure if there is infection tracking into the thigh. Risks of surgery were discussed including persistent infection neurovascular injury nonhealing the wound need for additional surgery need for higher level amputation. Patient states he understands wishes to proceed  at this time.  Nadara MustardMarcus V Mayling Aber, MD 01/31/2017, 7:18 AM

## 2017-01-31 NOTE — Transfer of Care (Signed)
Immediate Anesthesia Transfer of Care Note  Patient: Joette CatchingBobby L Eshbach  Procedure(s) Performed: Procedure(s): Right Above Knee Amputation (Right)  Patient Location: PACU  Anesthesia Type:General  Level of Consciousness: awake and patient cooperative  Airway & Oxygen Therapy: Patient Spontanous Breathing  Post-op Assessment: Report given to RN and Post -op Vital signs reviewed and stable  Post vital signs: Reviewed and stable  Last Vitals:  Vitals:   01/31/17 1007  BP: (!) 143/81  Pulse: (!) 105  Resp: 18  Temp: 37.2 C    Last Pain:  Vitals:   01/31/17 1007  TempSrc: Oral  PainSc:       Patients Stated Pain Goal: 3 (01/31/17 1000)  Complications: No apparent anesthesia complications

## 2017-01-31 NOTE — Op Note (Signed)
01/31/2017  12:18 PM  PATIENT:  Derrick [name]    PRE-OPERATIVE DIAGNOSIS:  Infected Right Below Knee Amputation  POST-OPERATIVE DIAGNOSIS:  Same  PROCEDURE:  Right Above Knee Amputation  SURGEON:  Nadara MustardMarcus V Duda, MD  PHYSICIAN ASSISTANT:None ANESTHESIA:   General  PREOPERATIVE INDICATIONS:  Derrick RoseBobby L Klees is a  46 y.o. male with a diagnosis of Infected Right Below Knee Amputation who failed conservative measures and elected for surgical management.    The risks benefits and alternatives were discussed with the patient preoperatively including but not limited to the risks of infection, bleeding, nerve injury, cardiopulmonary complications, the need for revision surgery, among others, and the patient was willing to proceed.  OPERATIVE IMPLANTS: none   OPERATIVE FINDINGS: abscess lateral thigh, cultures obtained, cultures are still pending from previous surgery.  OPERATIVE PROCEDURE: Patient brought to the operating room and underwent a general anesthetic. After adequate levels anesthesia obtained patient's right lower extremity was prepped using DuraPrep draped into a sterile field infected transtibial amputation was draped out of the sterile field with impervious stockinette. A timeout was called. A fishmouth incision was made through the distal femur this was carried sharply down to bone the bone was resected. The vascular bundles were clamped and suture ligated medially with 2-0 silk. A fishmouth incision was used to complete the amputation. Patient had an abscess laterally and cultures were obtained of the abscess. The incision was continued proximally on the lateral aspect of the fishmouth incision and the muscle that was involved with the abscess as well as the fascia was resected. There was no crepitation clinically did not have necrotizing fasciitis. The wound was irrigated with pulsatile lavage all remaining tissue was healthy viable and had not been in contact with the abscess.  The deep and superficial fascial layers and skin was closed using 2-0 nylon. A sterile compressive dressing was applied patient was extubated taken to PACU in stable condition. Patient refused admission because he could not smoke in the hospital. Plan for discharge to home patient will continue with his doxycycline we will adjust the antibiotics pending the cultures.

## 2017-01-31 NOTE — Anesthesia Postprocedure Evaluation (Signed)
Anesthesia Post Note  Patient: Derrick [name]  Procedure(s) Performed: Procedure(s) (LRB): Right Above Knee Amputation (Right)  Patient location during evaluation: PACU Anesthesia Type: General Level of consciousness: awake and alert Pain management: pain level controlled Vital Signs Assessment: post-procedure vital signs reviewed and stable Respiratory status: spontaneous breathing, nonlabored ventilation and respiratory function stable Cardiovascular status: blood pressure returned to baseline and stable Postop Assessment: no signs of nausea or vomiting Anesthetic complications: no       Last Vitals:  Vitals:   01/31/17 1245 01/31/17 1300  BP: 104/68 113/74  Pulse: (!) 105 99  Resp: (!) 23 17  Temp:      Last Pain:  Vitals:   01/31/17 1245  TempSrc:   PainSc: 0-No pain                 Natahsa Marian A.

## 2017-02-01 ENCOUNTER — Encounter (HOSPITAL_COMMUNITY): Payer: Self-pay | Admitting: Orthopedic Surgery

## 2017-02-02 ENCOUNTER — Other Ambulatory Visit (INDEPENDENT_AMBULATORY_CARE_PROVIDER_SITE_OTHER): Payer: Self-pay | Admitting: Family

## 2017-02-02 MED ORDER — AMOXICILLIN-POT CLAVULANATE 875-125 MG PO TABS: 1.0000 | ORAL_TABLET | Freq: Two times a day (BID) | ORAL | 0 refills | Status: DC

## 2017-02-04 ENCOUNTER — Encounter (HOSPITAL_COMMUNITY): Payer: Self-pay

## 2017-02-04 ENCOUNTER — Inpatient Hospital Stay (HOSPITAL_COMMUNITY)
Admission: EM | Admit: 2017-02-04 | Discharge: 2017-02-11 | DRG: 474 | Disposition: A | Discharge status: Home Health | Payer: Medicare Other | Attending: Family Medicine | Admitting: Family Medicine

## 2017-02-04 DIAGNOSIS — E874 Mixed disorder of acid-base balance: Secondary | ICD-10-CM | POA: Diagnosis present

## 2017-02-04 DIAGNOSIS — G8918 Other acute postprocedural pain: Secondary | ICD-10-CM

## 2017-02-04 DIAGNOSIS — L089 Local infection of the skin and subcutaneous tissue, unspecified: Secondary | ICD-10-CM | POA: Diagnosis present

## 2017-02-04 DIAGNOSIS — A419 Sepsis, unspecified organism: Secondary | ICD-10-CM | POA: Diagnosis present

## 2017-02-04 DIAGNOSIS — T148XXA Other injury of unspecified body region, initial encounter: Secondary | ICD-10-CM

## 2017-02-04 DIAGNOSIS — E1165 Type 2 diabetes mellitus with hyperglycemia: Secondary | ICD-10-CM | POA: Diagnosis present

## 2017-02-04 DIAGNOSIS — Z66 Do not resuscitate: Secondary | ICD-10-CM | POA: Diagnosis present

## 2017-02-04 DIAGNOSIS — E1142 Type 2 diabetes mellitus with diabetic polyneuropathy: Secondary | ICD-10-CM | POA: Diagnosis present

## 2017-02-04 DIAGNOSIS — T8743 Infection of amputation stump, right lower extremity: Principal | ICD-10-CM | POA: Diagnosis present

## 2017-02-04 DIAGNOSIS — E871 Hypo-osmolality and hyponatremia: Secondary | ICD-10-CM | POA: Diagnosis present

## 2017-02-04 DIAGNOSIS — Z79899 Other long term (current) drug therapy: Secondary | ICD-10-CM

## 2017-02-04 DIAGNOSIS — F17210 Nicotine dependence, cigarettes, uncomplicated: Secondary | ICD-10-CM | POA: Diagnosis present

## 2017-02-04 DIAGNOSIS — Z881 Allergy status to other antibiotic agents status: Secondary | ICD-10-CM

## 2017-02-04 DIAGNOSIS — Z89512 Acquired absence of left leg below knee: Secondary | ICD-10-CM

## 2017-02-04 DIAGNOSIS — Z794 Long term (current) use of insulin: Secondary | ICD-10-CM

## 2017-02-04 DIAGNOSIS — I10 Essential (primary) hypertension: Secondary | ICD-10-CM | POA: Diagnosis present

## 2017-02-04 DIAGNOSIS — E876 Hypokalemia: Secondary | ICD-10-CM | POA: Diagnosis not present

## 2017-02-04 DIAGNOSIS — K219 Gastro-esophageal reflux disease without esophagitis: Secondary | ICD-10-CM | POA: Diagnosis present

## 2017-02-04 DIAGNOSIS — L02415 Cutaneous abscess of right lower limb: Secondary | ICD-10-CM | POA: Diagnosis present

## 2017-02-04 DIAGNOSIS — T8781 Dehiscence of amputation stump: Secondary | ICD-10-CM | POA: Diagnosis present

## 2017-02-04 DIAGNOSIS — R509 Fever, unspecified: Secondary | ICD-10-CM

## 2017-02-04 DIAGNOSIS — M79604 Pain in right leg: Secondary | ICD-10-CM | POA: Diagnosis not present

## 2017-02-04 DIAGNOSIS — Y835 Amputation of limb(s) as the cause of abnormal reaction of the patient, or of later complication, without mention of misadventure at the time of the procedure: Secondary | ICD-10-CM | POA: Diagnosis present

## 2017-02-04 DIAGNOSIS — T8740 Infection of amputation stump, unspecified extremity: Secondary | ICD-10-CM | POA: Diagnosis present

## 2017-02-04 DIAGNOSIS — D638 Anemia in other chronic diseases classified elsewhere: Secondary | ICD-10-CM | POA: Diagnosis present

## 2017-02-04 LAB — CBC WITH DIFFERENTIAL/PLATELET
BASOS ABS: 0 10*3/uL (ref 0.0–0.1)
Basophils Relative: 0 %
EOS PCT: 0 %
Eosinophils Absolute: 0 10*3/uL (ref 0.0–0.7)
HEMATOCRIT: 32.9 % — AB (ref 39.0–52.0)
HEMOGLOBIN: 11.4 g/dL — AB (ref 13.0–17.0)
LYMPHS ABS: 2.1 10*3/uL (ref 0.7–4.0)
LYMPHS PCT: 5 %
MCH: 26.5 pg (ref 26.0–34.0)
MCHC: 34.7 g/dL (ref 30.0–36.0)
MCV: 76.5 fL — AB (ref 78.0–100.0)
MONOS PCT: 5 %
Monocytes Absolute: 2.1 10*3/uL — ABNORMAL HIGH (ref 0.1–1.0)
NEUTROS ABS: 37.2 10*3/uL — AB (ref 1.7–7.7)
Neutrophils Relative %: 90 %
Platelets: 1001 10*3/uL (ref 150–400)
RBC: 4.3 MIL/uL (ref 4.22–5.81)
RDW: 15.4 % (ref 11.5–15.5)
WBC: 41.4 10*3/uL — ABNORMAL HIGH (ref 4.0–10.5)

## 2017-02-04 LAB — BASIC METABOLIC PANEL
Anion gap: 19 — ABNORMAL HIGH (ref 5–15)
BUN: 10 mg/dL (ref 6–20)
CHLORIDE: 89 mmol/L — AB (ref 101–111)
CO2: 23 mmol/L (ref 22–32)
Calcium: 8.8 mg/dL — ABNORMAL LOW (ref 8.9–10.3)
Creatinine, Ser: 0.85 mg/dL (ref 0.61–1.24)
GFR calc non Af Amer: >60 mL/min (ref >60)
GLUCOSE: 370 mg/dL — AB (ref 65–99)
Potassium: 3.7 mmol/L (ref 3.5–5.1)
Sodium: 131 mmol/L — ABNORMAL LOW (ref 135–145)

## 2017-02-04 LAB — BETA-HYDROXYBUTYRIC ACID: Beta-Hydroxybutyric Acid: 2.93 mmol/L — ABNORMAL HIGH (ref 0.05–0.27)

## 2017-02-04 LAB — I-STAT VENOUS BLOOD GAS, ED
Acid-Base Excess: 5 mmol/L — ABNORMAL HIGH (ref 0.0–2.0)
Bicarbonate: 28.7 mmol/L — ABNORMAL HIGH (ref 20.0–28.0)
O2 Saturation: 44 %
PCO2 VEN: 36.7 mmHg — AB (ref 44.0–60.0)
PO2 VEN: 22 mmHg — AB (ref 32.0–45.0)
TCO2: 30 mmol/L (ref 0–100)
pH, Ven: 7.502 — ABNORMAL HIGH (ref 7.250–7.430)

## 2017-02-04 LAB — CBG MONITORING, ED: GLUCOSE-CAPILLARY: 393 mg/dL — AB (ref 65–99)

## 2017-02-04 LAB — GLUCOSE, CAPILLARY: GLUCOSE-CAPILLARY: 284 mg/dL — AB (ref 65–99)

## 2017-02-04 LAB — I-STAT CG4 LACTIC ACID, ED
LACTIC ACID, VENOUS: 2.51 mmol/L — AB (ref 0.5–1.9)
LACTIC ACID, VENOUS: 1.32 mmol/L (ref 0.5–1.9)

## 2017-02-04 MED ORDER — LEVOFLOXACIN IN D5W 750 MG/150ML IV SOLN
750.0000 mg | INTRAVENOUS | Status: DC
  Administered 2017-02-04: 750 mg via INTRAVENOUS
  Filled 2017-02-04 (×3): qty 150

## 2017-02-04 MED ORDER — PIPERACILLIN-TAZOBACTAM 3.375 G IVPB 30 MIN
3.3750 g | Freq: Once | INTRAVENOUS | Status: AC
  Administered 2017-02-04: 3.375 g via INTRAVENOUS
  Filled 2017-02-04: qty 50

## 2017-02-04 MED ORDER — INSULIN GLARGINE 100 UNIT/ML ~~LOC~~ SOLN
7.0000 [IU] | Freq: Every day | SUBCUTANEOUS | Status: DC
  Administered 2017-02-05 – 2017-02-07 (×4): 7 [IU] via SUBCUTANEOUS
  Filled 2017-02-04 (×5): qty 0.07

## 2017-02-04 MED ORDER — SODIUM CHLORIDE 0.9 % IV BOLUS (SEPSIS)
1000.0000 mL | Freq: Once | INTRAVENOUS | Status: AC
  Administered 2017-02-04: 1000 mL via INTRAVENOUS

## 2017-02-04 MED ORDER — PIPERACILLIN-TAZOBACTAM 3.375 G IVPB
3.3750 g | Freq: Three times a day (TID) | INTRAVENOUS | Status: DC
Start: 2017-02-05 — End: 2017-02-05
  Administered 2017-02-05: 3.375 g via INTRAVENOUS
  Filled 2017-02-04 (×2): qty 50

## 2017-02-04 MED ORDER — HYDROCODONE-ACETAMINOPHEN 5-325 MG PO TABS
1.0000 | ORAL_TABLET | ORAL | Status: DC | PRN
  Administered 2017-02-04 – 2017-02-06 (×5): 2 via ORAL
  Filled 2017-02-04 (×5): qty 2

## 2017-02-04 MED ORDER — HYDROMORPHONE HCL 2 MG/ML IJ SOLN
1.0000 mg | Freq: Once | INTRAMUSCULAR | Status: AC
  Administered 2017-02-04: 1 mg via INTRAVENOUS
  Filled 2017-02-04: qty 1

## 2017-02-04 MED ORDER — SODIUM CHLORIDE 0.9 % IV SOLN
3.0000 g | Freq: Once | INTRAVENOUS | Status: AC
  Administered 2017-02-04: 3 g via INTRAVENOUS
  Filled 2017-02-04: qty 3

## 2017-02-04 MED ORDER — INSULIN ASPART 100 UNIT/ML ~~LOC~~ SOLN: 0.0000 [IU] | Freq: Three times a day (TID) | SUBCUTANEOUS | Status: DC

## 2017-02-04 NOTE — Progress Notes (Signed)
Pharmacy Antibiotic Note  Derrick RoseBobby L [name] is a 46 y.o. male s/p right AKA on 01/31/17, sent home on augmentin for wound cx growing group B strep, returns to the ED today with worsening pain and purulent drainage. He received 1 dose of unasyn in the ED. Pharmacy has been consulted for zosyn and levaquin dosing. Renal function stable.  Plan: 1) Zosyn 3.375g IV q8 (EI) 2) Levaquin 750mg  IV q24 3) Follow renal function, cultures, LOT  Height: 6\' 1"  (185.4 cm) Weight: 200 lb (90.7 kg) IBW/kg (Calculated) : 79.9  Temp (24hrs), Avg:97.8 F (36.6 C), Min:97.8 F (36.6 C), Max:97.8 F (36.6 C)   Recent Labs Lab 02/04/17 1753 02/04/17 1807  WBC 41.4*  --   CREATININE 0.85  --   LATICACIDVEN  --  2.51*    Estimated Creatinine Clearance: 125.3 mL/min (by C-G formula based on SCr of 0.85 mg/dL).    Allergies  Allergen Reactions  . Vancomycin Other (See Comments)    Acute kidney injury    Antimicrobials this admission: 2/18 Unasyn x 1 2/18 Zosyn >> 2/18 Levaquin >>  Dose adjustments this admission:   Microbiology results: 2/18 blood x2>>  Thank you for allowing pharmacy to be a part of this patient's care.  Fredrik RiggerMarkle, Traye Bates Sue 02/04/2017 9:01 PM

## 2017-02-04 NOTE — ED Notes (Addendum)
Pt refusing to keep BP cuff and pulse oximeter probe on. Explained to pt need for VS monitoring but pt states "I'm not doing it." Will attempt to take another set of vital signs when pt will cooperate.

## 2017-02-04 NOTE — ED Notes (Signed)
Admitting provider at bedside.

## 2017-02-04 NOTE — ED Notes (Signed)
ED Provider at bedside. 

## 2017-02-04 NOTE — ED Notes (Signed)
Pt referring to pulse ox sticker and states "What is this shit on my finger. I'm not keeping this on." Explained to pt need to monitor VS closely. Pt verbalized understanding but reports "we'll see how long that lasts".

## 2017-02-04 NOTE — ED Notes (Addendum)
Pt refusing rectal temperature. Explained to pt importance of obtaining accurate temperature but pt states "Hell no. That's not happening". While speaking with pt, pt accused RN of "squishing his finger" against the side rail. When attempting to apologize, pt stated angrily through gritted teeth, "you smash my finger again...". Pt also requesting more medication for pain. MD aware.

## 2017-02-04 NOTE — ED Notes (Signed)
MD aware of pt platelet count

## 2017-02-04 NOTE — ED Provider Notes (Signed)
[facility] DEPT Provider Note   CSN: [id number] Arrival date & time: 02/04/17  1730     History   Chief Complaint Chief Complaint  Patient presents with  . Post-op Problem  . Leg Pain    HPI Derrick [name] is a 46 y.o. male. Patient is a 46 year old male with poorly controlled diabetes who presents due to worsening pain in his right leg at the site of his recent above-knee indication. Patient underwent right AKA on Wednesday, 5 days ago. He reports his pain is been getting steadily worse. He was seen in postop on Thursday and was started on Augmentin. He endorses fevers and been going on for 2 weeks. Last night, they noted blood and foul-smelling discharge soaking through the bandage. He had prior gangrenous infection in the right BKA which led to his recent AKA.  HPI  Past Medical History:  Diagnosis Date  . Anemia   . Arthritis   . Diabetes mellitus    Type II  . GERD (gastroesophageal reflux disease)   . Neuropathy ([place])   . Peripheral neuropathy ([place])   . Pneumonia   . Prostatitis     Patient Active Problem List   Diagnosis Date Noted  . Dehiscence of amputation stump ([place]) 01/30/2017  . Abscess of right leg   . Infection of below knee amputation stump ([place]) 01/22/2017  . Below knee amputation status, right ([address]) 01/06/2017  . Diabetic wet gangrene of the foot ([place])   . Diabetic foot infection ([place]) 12/13/2016  . HTN (hypertension) 12/13/2016  . Sepsis ([place]) 12/13/2016  . Hypokalemia 12/13/2016  . Chronic osteomyelitis involving ankle and foot ([place]) 06/03/2012  . Syncope and collapse 04/12/2012  . Tobacco abuse 04/12/2012  . Cellulitis 02/25/2012  . Diabetic toe ulcer ([place]) 02/25/2012  . Diabetes mellitus ([place]) 02/25/2012  . Leukocytosis 02/25/2012    Past Surgical History:  Procedure Laterality Date  . AMPUTATION  05/31/2012   Procedure: AMPUTATION DIGIT;  Surgeon: Newt Minion, MD;  Location: [facility];  Service: Orthopedics;  Laterality: Right;  right  fourth and fifth toe ray amputation   . AMPUTATION Right 12/14/2016   Procedure: AMPUTATION BELOW KNEE RIGHT;  Surgeon: Newt Minion, MD;  Location: [facility];  Service: Orthopedics;  Laterality: Right;  . AMPUTATION Right 01/31/2017   Procedure: Right Above Knee Amputation;  Surgeon: Newt Minion, MD;  Location: [facility];  Service: Orthopedics;  Laterality: Right;  . I&D EXTREMITY  08/02/2012   Procedure: IRRIGATION AND DEBRIDEMENT EXTREMITY;  Surgeon: Newt Minion, MD;  Location: [facility];  Service: Orthopedics;  Laterality: Right;  Right Foot Irrigation and Debridement, Place Antibiotic Beads  . SHOULDER SURGERY Bilateral    rotarcuff  . STUMP REVISION Left 07/12/2016   Procedure: Revision Left Below Knee Amputation;  Surgeon: Newt Minion, MD;  Location: [facility];  Service: Orthopedics;  Laterality: Left;  . STUMP REVISION Right 01/24/2017   Procedure: Revision Right Below Knee Amputation;  Surgeon: Newt Minion, MD;  Location: [facility];  Service: Orthopedics;  Laterality: Right;  . TOE AMPUTATION     multiple toes amputated.       Home Medications    Prior to Admission medications   Medication Sig Start Date End Date Taking? Authorizing Provider  acetaminophen (TYLENOL) 325 MG tablet Take 2 tablets (650 mg total) by mouth every 6 (six) hours as needed for mild pain or fever (or Fever >/= 101). Acetaminophen dose from all sources not to exceed 4 g  per day. 12/15/16   Modena Jansky, MD  amoxicillin-clavulanate (AUGMENTIN) 875-125 MG tablet Take 1 tablet by mouth 2 (two) times daily. 02/02/17   Suzan Slick, NP  blood glucose meter kit and supplies KIT Dispense based on patient and insurance preference. Use up to four times daily as directed. (FOR ICD-9 250.00, 250.01). 12/15/16   Modena Jansky, MD  ciprofloxacin (CIPRO) 500 MG tablet Take 1 tablet (500 mg total) by mouth 2 (two) times daily. 12/15/16   Modena Jansky, MD  doxycycline (VIBRA-TABS) 100 MG tablet Take 1 tablet (100 mg total) by  mouth 2 (two) times daily. 01/22/17   Newt Minion, MD  gabapentin (NEURONTIN) 100 MG capsule Take 1 capsule (100 mg total) by mouth 3 (three) times daily. 01/06/17   Suzan Slick, NP  insulin aspart (NOVOLOG FLEXPEN) 100 UNIT/ML FlexPen Inject 0-15 Units into the skin 3 (three) times daily with meals. CBG < 70: eat or drink something and recheck. CBG 70 - 120: 0 units CBG 121 - 150: 2 units CBG 151 - 200: 3 units CBG 201 - 250: 5 units CBG 251 - 300: 8 units CBG 301 - 350: 11 units CBG 351 - 400: 15 units CBG > 400: call MD. 12/15/16   Modena Jansky, MD  Insulin Glargine (LANTUS SOLOSTAR) 100 UNIT/ML Solostar Pen Inject 20 Units into the skin daily at 10 pm. 12/15/16   Modena Jansky, MD  Insulin Pen Needle (PEN NEEDLES 3/16") 31G X 5 MM MISC Use as directed 3 times daily with meals and at bedtime. 12/15/16   Modena Jansky, MD  lisinopril (PRINIVIL,ZESTRIL) 10 MG tablet Take 1 tablet (10 mg total) by mouth daily. 12/16/16   Modena Jansky, MD  oxyCODONE-acetaminophen (PERCOCET/ROXICET) 5-325 MG tablet Take 1 tablet by mouth every 4 (four) hours as needed for moderate pain or severe pain. 01/31/17   Newt Minion, MD  promethazine (PHENERGAN) 25 MG tablet Take 25 mg by mouth every 6 (six) hours as needed for nausea or vomiting.    Historical Provider, MD    Family History Family History  Problem Relation Age of Onset  . Cancer Mother   . Congestive Heart Failure Father     Social History Social History  Substance Use Topics  . Smoking status: Current Every Day Smoker    Packs/day: 2.00    Years: 24.00    Types: Cigarettes  . Smokeless tobacco: Never Used  . Alcohol use No     Allergies   Vancomycin   Review of Systems Review of Systems  Constitutional: Positive for fever. Negative for chills.  HENT: Negative for ear pain and sore throat.   Eyes: Negative for pain and visual disturbance.  Respiratory: Negative for cough and shortness of breath.   Cardiovascular:  Negative for chest pain and palpitations.  Gastrointestinal: Negative for abdominal pain and vomiting.  Genitourinary: Negative for dysuria and hematuria.  Musculoskeletal: Positive for myalgias. Negative for arthralgias and back pain.  Skin: Negative for color change and rash.  Neurological: Negative for seizures and syncope.  All other systems reviewed and are negative.    Physical Exam Updated Vital Signs BP 134/73   Pulse 93   Temp 97.8 F (36.6 C) (Oral)   Resp 20   Ht '6\' 1"'  (1.854 m)   Wt 90.7 kg   SpO2 100%   BMI 26.39 kg/m   Physical Exam  Constitutional: He appears well-developed and well-nourished.  HENT:  Head: Normocephalic and atraumatic.  Eyes: Conjunctivae are normal.  Neck: Neck supple.  Cardiovascular: Regular rhythm.   No murmur heard. Tachycardic  Pulmonary/Chest: Effort normal and breath sounds normal. No respiratory distress.  Abdominal: Soft. There is no tenderness.  Musculoskeletal: He exhibits tenderness and deformity. He exhibits no edema.  Left BKA. Right leg with dressing in place of her recent AKA. There is small amount of blood soaking the posterior aspect of the dressing. On dressing removal, there is moderate amount of foul-smelling discharge that is soaked through the ABG pad and Kerlix. Sutures in place over the wound. There is mild amount of purulent discharge on the most lateral aspect of the wound. No significant synovitic changes. He is most tender surrounding the wound and along the posterior aspect of his thigh.  Neurological: He is alert.  Skin: Skin is warm and dry.  Psychiatric:  Pt is agitated  Nursing note and vitals reviewed.   ED Treatments / Results  Labs (all labs ordered are listed, but only abnormal results are displayed) Labs Reviewed  CBC WITH DIFFERENTIAL/PLATELET - Abnormal; Notable for the following:       Result Value   WBC 41.4 (*)    Hemoglobin 11.4 (*)    HCT 32.9 (*)    MCV 76.5 (*)    Platelets 1,001 (*)     Neutro Abs 37.2 (*)    Monocytes Absolute 2.1 (*)    All other components within normal limits  BASIC METABOLIC PANEL - Abnormal; Notable for the following:    Sodium 131 (*)    Chloride 89 (*)    Glucose, Bld 370 (*)    Calcium 8.8 (*)    Anion gap 19 (*)    All other components within normal limits  BETA-HYDROXYBUTYRIC ACID - Abnormal; Notable for the following:    Beta-Hydroxybutyric Acid 2.93 (*)    All other components within normal limits  CBG MONITORING, ED - Abnormal; Notable for the following:    Glucose-Capillary 393 (*)    All other components within normal limits  I-STAT CG4 LACTIC ACID, ED - Abnormal; Notable for the following:    Lactic Acid, Venous 2.51 (*)    All other components within normal limits  I-STAT VENOUS BLOOD GAS, ED - Abnormal; Notable for the following:    pH, Ven 7.502 (*)    pCO2, Ven 36.7 (*)    pO2, Ven 22.0 (*)    Bicarbonate 28.7 (*)    Acid-Base Excess 5.0 (*)    All other components within normal limits  PATHOLOGIST SMEAR REVIEW    EKG  EKG Interpretation None       Radiology No results found.  Procedures Procedures (including critical care time)  Medications Ordered in ED Medications  Ampicillin-Sulbactam (UNASYN) 3 g in sodium chloride 0.9 % 100 mL IVPB (3 g Intravenous New Bag/Given 02/04/17 2005)  HYDROmorphone (DILAUDID) injection 1 mg (1 mg Intravenous Given 02/04/17 1813)  sodium chloride 0.9 % bolus 1,000 mL (1,000 mLs Intravenous New Bag/Given 02/04/17 1853)  HYDROmorphone (DILAUDID) injection 1 mg (1 mg Intravenous Given 02/04/17 1959)     Initial Impression / Assessment and Plan / ED Course  I have reviewed the triage vital signs and the nursing notes.  Pertinent labs & imaging results that were available during my care of the patient were reviewed by me and considered in my medical decision making (see chart for details).    Patient is a 46 year old male with history as above who  presents with worsening pain  and foul-smelling discharge coming from his recent right AKA. Here, wound has foul-smelling discharge and mild purulent drainage. I blood cell count 41,000. Lactic acid 2.5. He is afebrile orally, refusing rectal temperature. Orthopedics consulted and I spoke with Nicki Reaper with [facility]. They advised to admit the patient to medicine and Dr. Sharol Given will see him in the morning. Patient is hemodynamically stable. I started Unasyn and gave 1 L normal saline. Patient admitted to family medicine for further workup and treatment.  Final Clinical Impressions(s) / ED Diagnoses   Final diagnoses:  Post-operative pain    New Prescriptions New Prescriptions   No medications on file     Clifton James, MD 02/04/17 2014    Fatima Blank, MD 02/05/17 [phone]

## 2017-02-04 NOTE — H&P (Signed)
[facility] Admission History and Physical Service Pager: [phone]  Patient name: Derrick [name] Medical record number: [id number] Date of birth: 1971-09-02 Age: 46 y.o. Gender: male  Primary Care Provider: Pcp Not In System Consultants: Orthopedics Code Status: DNR   Chief Complaint: Right AKA wound infection   Assessment and Plan: Derrick [name] is a 46 y.o. male presenting with right AKA pain, bleeding, and possible infection. PMH is significant for poorly controlled DM, smoker.   Right AKA stump wound: Non-toxic appearing on exam. Afebrile (orally, refused rectal temp) and BP stable in ED. Meets sepsis criteria with leukocytosis 41.4, tachycardia. Lactic acidosis to 2.93 > 1.32. Normal anion gap and bicarb. VBG pH mildly alkalotic. Recent Right AKA on 01/31/17 due to infected R BKA; taking Augmentin and Ciprofloxacin at home. Wound culture from 2/14 surgery: group B strep, possible anaerobe, GN rods and GP cocci on gram stain. Was taking Augmentin and Ciprofloxacin (started 2/17)  at home.  - admit to med surg, attending Dr. Ree Kida  - s/p Unasyn x 1 in ED. Will broaden for now with IV Zosyn and Levaquin (discussed with pharmacy). Allergic to Vancomycin. Consider discussing with ID in AM if Linezolid is needed/can be used for MRSA coverage.  - s/p 1L bolus in ED. Order 1L bolus x 1  - blood cultures  - Ortho consulted by ED, will see in the AM - NPO at midnight in case ortho wants to take back to OR  Lactic Acidosis, resolved - note above  DM2: uncontrolled. A1c 14.5 on 11/2017. Reports he does not take insulin at home. CBG 370. AG is elevated but bicarb is normal. Unlikely in DKA.  - Lantus 7 units - Sensitive SSI - CBG ACHS > switch to q 4 at midnight   Elevated Platelets: possibly due to recent surgery and infection (acute phase reactant) - monitor   Tobacco Use: 2ppd - declined Nicotine patch   FEN/GI: carb modified, NPO at midnight   Prophylaxis: holding in case ortho would like to take to OR.   Disposition: admit to FPTS for further evaluation and treatment   History of Present Illness:  Derrick [name] is a 46 y.o. male presenting with right AKA stump pain and possible infection.  Patient reports of pain in Right AKA stump since having the surgery last Wednesday. On 2/14, he had a R AKA due to an infected R BKA. Reports of seeing blood on the dressing this morning which prompted patient to come to the ED. He also reports of subjective fevers and chills for the past two weeks as well as decreased PO intake x 2 weeks although he has been drinking fluids well per his girlfriend. Reports of intermittent nausea since his recent surgery with one episode of emesis this AM (non-bloody). Reports of intermittent diffuse abdominal pain since surgery. Normal BMs. Reports he does not take insulin or Lisinopril which are noted as home medications.   Had Right AKA on 01/31/17 due to infected R BKA. Was discharged with Augmentin and also asked to take Ciprofloxacin which patient started 2/17   In the ED, there was noted to be a moderate amount of foul smelling discharge on the dressing (removed when FPTS went to evaluate). In the ED, was tachycardic to 102 with stable BP. Leukocytosis to 41. Lactic acid 2.51. 1L bolus given. Orthopedics called in the ED.   Review Of Systems: Per HPI with the following additions:   ROS: as noted above  Patient Active Problem List   Diagnosis Date Noted  . Amputation stump infection ([place]) 02/04/2017  . Dehiscence of amputation stump ([place]) 01/30/2017  . Abscess of right leg   . Infection of below knee amputation stump ([place]) 01/22/2017  . Below knee amputation status, right ([place]) 01/06/2017  . Diabetic wet gangrene of the foot ([place])   . Diabetic foot infection ([place]) 12/13/2016  . HTN (hypertension) 12/13/2016  . Sepsis ([place]) 12/13/2016  . Hypokalemia 12/13/2016  . Chronic osteomyelitis involving  ankle and foot ([place]) 06/03/2012  . Syncope and collapse 04/12/2012  . Tobacco abuse 04/12/2012  . Cellulitis 02/25/2012  . Diabetic toe ulcer ([place]) 02/25/2012  . Diabetes mellitus ([place]) 02/25/2012  . Leukocytosis 02/25/2012    Past Medical History: Past Medical History:  Diagnosis Date  . Anemia   . Arthritis   . Diabetes mellitus    Type II  . GERD (gastroesophageal reflux disease)   . Neuropathy ([place])   . Peripheral neuropathy ([place])   . Pneumonia   . Prostatitis     Past Surgical History: Past Surgical History:  Procedure Laterality Date  . AMPUTATION  05/31/2012   Procedure: AMPUTATION DIGIT;  Surgeon: Newt Minion, MD;  Location: [name];  Service: Orthopedics;  Laterality: Right;  right fourth and fifth toe ray amputation   . AMPUTATION Right 12/14/2016   Procedure: AMPUTATION BELOW KNEE RIGHT;  Surgeon: Newt Minion, MD;  Location: [facility];  Service: Orthopedics;  Laterality: Right;  . AMPUTATION Right 01/31/2017   Procedure: Right Above Knee Amputation;  Surgeon: Newt Minion, MD;  Location: [facility];  Service: Orthopedics;  Laterality: Right;  . I&D EXTREMITY  08/02/2012   Procedure: IRRIGATION AND DEBRIDEMENT EXTREMITY;  Surgeon: Newt Minion, MD;  Location: [facility];  Service: Orthopedics;  Laterality: Right;  Right Foot Irrigation and Debridement, Place Antibiotic Beads  . SHOULDER SURGERY Bilateral    rotarcuff  . STUMP REVISION Left 07/12/2016   Procedure: Revision Left Below Knee Amputation;  Surgeon: Newt Minion, MD;  Location: [facility];  Service: Orthopedics;  Laterality: Left;  . STUMP REVISION Right 01/24/2017   Procedure: Revision Right Below Knee Amputation;  Surgeon: Newt Minion, MD;  Location: [facility] [place];  Service: Orthopedics;  Laterality: Right;  . TOE AMPUTATION     multiple toes amputated.    Social History: Social History  Substance Use Topics  . Smoking status: Current Every Day Smoker    Packs/day: 2.00    Years: 24.00    Types: Cigarettes  . Smokeless  tobacco: Never Used  . Alcohol use No   Additional social history: denies alcohol or drug use other than tobacco  Please also refer to relevant sections of EMR.  Family History: Family History  Problem Relation Age of Onset  . Cancer Mother   . Congestive Heart Failure Father     Allergies and Medications: Allergies  Allergen Reactions  . Vancomycin Other (See Comments)    Acute kidney injury   No current facility-administered medications on file prior to encounter.    Current Outpatient Prescriptions on File Prior to Encounter  Medication Sig Dispense Refill  . acetaminophen (TYLENOL) 325 MG tablet Take 2 tablets (650 mg total) by mouth every 6 (six) hours as needed for mild pain or fever (or Fever >/= 101). Acetaminophen dose from all sources not to exceed 4 g per day.    Marland Kitchen amoxicillin-clavulanate (AUGMENTIN) 875-125 MG tablet Take 1 tablet by mouth 2 (two) times  daily. 60 tablet 0  . blood glucose meter kit and supplies KIT Dispense based on patient and insurance preference. Use up to four times daily as directed. (FOR ICD-9 250.00, 250.01). 1 each 0  . ciprofloxacin (CIPRO) 500 MG tablet Take 1 tablet (500 mg total) by mouth 2 (two) times daily. 14 tablet 0  . doxycycline (VIBRA-TABS) 100 MG tablet Take 1 tablet (100 mg total) by mouth 2 (two) times daily. 14 tablet 0  . gabapentin (NEURONTIN) 100 MG capsule Take 1 capsule (100 mg total) by mouth 3 (three) times daily. 90 capsule 0  . insulin aspart (NOVOLOG FLEXPEN) 100 UNIT/ML FlexPen Inject 0-15 Units into the skin 3 (three) times daily with meals. CBG < 70: eat or drink something and recheck. CBG 70 - 120: 0 units CBG 121 - 150: 2 units CBG 151 - 200: 3 units CBG 201 - 250: 5 units CBG 251 - 300: 8 units CBG 301 - 350: 11 units CBG 351 - 400: 15 units CBG > 400: call MD. 15 mL 0  . Insulin Glargine (LANTUS SOLOSTAR) 100 UNIT/ML Solostar Pen Inject 20 Units into the skin daily at 10 pm. 15 mL 0  . Insulin Pen Needle (PEN  NEEDLES 3/16") 31G X 5 MM MISC Use as directed 3 times daily with meals and at bedtime. 120 each 0  . lisinopril (PRINIVIL,ZESTRIL) 10 MG tablet Take 1 tablet (10 mg total) by mouth daily. 30 tablet 0  . oxyCODONE-acetaminophen (PERCOCET/ROXICET) 5-325 MG tablet Take 1 tablet by mouth every 4 (four) hours as needed for moderate pain or severe pain. 60 tablet 0  . promethazine (PHENERGAN) 25 MG tablet Take 25 mg by mouth every 6 (six) hours as needed for nausea or vomiting.      Objective: BP 144/88 (BP Location: Left Arm)   Pulse 107   Temp 97.8 F (36.6 C) (Oral)   Resp 20   Ht '6\' 1"'  (1.854 m)   Wt 90.7 kg (200 lb)   SpO2 100%   BMI 26.39 kg/m  Exam: GEN: NAD HEENT: Atraumatic, normocephalic, neck supple, EOMI, sclera clear, dry mucous membranes  CV: RRR, no murmurs, rubs, or gallops PULM: CTAB anteriorly (declined to sit up or turn to side to auscultate posteriorly), normal effort ABD: Soft, nontender, nondistended, NABS, no organomegaly SKIN: No rash or cyanosis; warm and well-perfused EXTR: Left BKA. Right AKA with sutures noted at the stump. There is some dried blood near the sutures. No significant erythema noted at the wound. Tender mainly around the wound and on posterior stump.  PSYCH: Mood and affect euthymic, normal rate and volume of speech NEURO: Awake, alert, no focal deficits grossly, normal speech   Labs and Imaging: CBC BMET   Recent Labs Lab 02/04/17 1753  WBC 41.4*  HGB 11.4*  HCT 32.9*  PLT 1,001*    Recent Labs Lab 02/04/17 1753  NA 131*  K 3.7  CL 89*  CO2 23  BUN 10  CREATININE 0.85  GLUCOSE 370*  CALCIUM 8.8*    Lactic Acid: 2.51 > 1.32 Beta Hydroxybutyric Acid: 2.93  Smiley Houseman, MD 02/04/2017, 8:45 PM PGY2, [facility] Intern pager: [phone], text pages welcome

## 2017-02-04 NOTE — ED Notes (Signed)
Pt given subway sandwich by significant other.

## 2017-02-04 NOTE — ED Triage Notes (Addendum)
[facility] from home c/o R leg pain s/p right BKA on Wednesday due to diabetes complications. Pt reports increased pain since his surgery Wednesday, pt was given percocet but reports no relief. Pt also on cipro. Per EMS, pt noncompliant with with DM medications since his surgery and reports he has no appetite. CBG 408, 142/86, HR 96, 99% on RA.

## 2017-02-05 ENCOUNTER — Other Ambulatory Visit (INDEPENDENT_AMBULATORY_CARE_PROVIDER_SITE_OTHER): Payer: Self-pay | Admitting: Family

## 2017-02-05 ENCOUNTER — Inpatient Hospital Stay (HOSPITAL_COMMUNITY): Payer: Medicare Other | Admitting: Certified Registered Nurse Anesthetist

## 2017-02-05 ENCOUNTER — Ambulatory Visit (INDEPENDENT_AMBULATORY_CARE_PROVIDER_SITE_OTHER): Payer: Medicare Other | Admitting: Orthopedic Surgery

## 2017-02-05 ENCOUNTER — Encounter (HOSPITAL_COMMUNITY): Payer: Self-pay | Admitting: *Deleted

## 2017-02-05 ENCOUNTER — Encounter (HOSPITAL_COMMUNITY)
Admission: EM | Disposition: A | Discharge status: Home Health | Payer: Self-pay | Source: Home / Self Care | Attending: Family Medicine

## 2017-02-05 DIAGNOSIS — E1142 Type 2 diabetes mellitus with diabetic polyneuropathy: Secondary | ICD-10-CM | POA: Diagnosis present

## 2017-02-05 DIAGNOSIS — T8781 Dehiscence of amputation stump: Secondary | ICD-10-CM | POA: Diagnosis present

## 2017-02-05 DIAGNOSIS — F17210 Nicotine dependence, cigarettes, uncomplicated: Secondary | ICD-10-CM | POA: Diagnosis present

## 2017-02-05 DIAGNOSIS — Z66 Do not resuscitate: Secondary | ICD-10-CM | POA: Diagnosis present

## 2017-02-05 DIAGNOSIS — Z89512 Acquired absence of left leg below knee: Secondary | ICD-10-CM | POA: Diagnosis not present

## 2017-02-05 DIAGNOSIS — G8918 Other acute postprocedural pain: Secondary | ICD-10-CM

## 2017-02-05 DIAGNOSIS — T148XXA Other injury of unspecified body region, initial encounter: Secondary | ICD-10-CM

## 2017-02-05 DIAGNOSIS — L089 Local infection of the skin and subcutaneous tissue, unspecified: Secondary | ICD-10-CM | POA: Diagnosis present

## 2017-02-05 DIAGNOSIS — E874 Mixed disorder of acid-base balance: Secondary | ICD-10-CM | POA: Diagnosis present

## 2017-02-05 DIAGNOSIS — Z720 Tobacco use: Secondary | ICD-10-CM | POA: Diagnosis not present

## 2017-02-05 DIAGNOSIS — K219 Gastro-esophageal reflux disease without esophagitis: Secondary | ICD-10-CM | POA: Diagnosis present

## 2017-02-05 DIAGNOSIS — D638 Anemia in other chronic diseases classified elsewhere: Secondary | ICD-10-CM | POA: Diagnosis present

## 2017-02-05 DIAGNOSIS — E871 Hypo-osmolality and hyponatremia: Secondary | ICD-10-CM | POA: Diagnosis present

## 2017-02-05 DIAGNOSIS — T8743 Infection of amputation stump, right lower extremity: Secondary | ICD-10-CM | POA: Diagnosis present

## 2017-02-05 DIAGNOSIS — Z794 Long term (current) use of insulin: Secondary | ICD-10-CM | POA: Diagnosis not present

## 2017-02-05 DIAGNOSIS — Z79899 Other long term (current) drug therapy: Secondary | ICD-10-CM | POA: Diagnosis not present

## 2017-02-05 DIAGNOSIS — A419 Sepsis, unspecified organism: Secondary | ICD-10-CM | POA: Diagnosis present

## 2017-02-05 DIAGNOSIS — R509 Fever, unspecified: Secondary | ICD-10-CM | POA: Diagnosis not present

## 2017-02-05 DIAGNOSIS — E1165 Type 2 diabetes mellitus with hyperglycemia: Secondary | ICD-10-CM | POA: Diagnosis present

## 2017-02-05 DIAGNOSIS — Y835 Amputation of limb(s) as the cause of abnormal reaction of the patient, or of later complication, without mention of misadventure at the time of the procedure: Secondary | ICD-10-CM | POA: Diagnosis present

## 2017-02-05 DIAGNOSIS — L02415 Cutaneous abscess of right lower limb: Secondary | ICD-10-CM | POA: Diagnosis present

## 2017-02-05 DIAGNOSIS — E876 Hypokalemia: Secondary | ICD-10-CM | POA: Diagnosis not present

## 2017-02-05 DIAGNOSIS — E118 Type 2 diabetes mellitus with unspecified complications: Secondary | ICD-10-CM

## 2017-02-05 DIAGNOSIS — T8740 Infection of amputation stump, unspecified extremity: Secondary | ICD-10-CM | POA: Diagnosis not present

## 2017-02-05 DIAGNOSIS — M79604 Pain in right leg: Secondary | ICD-10-CM | POA: Diagnosis present

## 2017-02-05 DIAGNOSIS — I10 Essential (primary) hypertension: Secondary | ICD-10-CM | POA: Diagnosis present

## 2017-02-05 DIAGNOSIS — Z881 Allergy status to other antibiotic agents status: Secondary | ICD-10-CM | POA: Diagnosis not present

## 2017-02-05 HISTORY — PX: STUMP REVISION: SHX6102

## 2017-02-05 HISTORY — PX: APPLICATION OF WOUND VAC: SHX5189

## 2017-02-05 LAB — BASIC METABOLIC PANEL
ANION GAP: 10 (ref 5–15)
BUN: 9 mg/dL (ref 6–20)
CALCIUM: 8.1 mg/dL — AB (ref 8.9–10.3)
CO2: 27 mmol/L (ref 22–32)
Chloride: 96 mmol/L — ABNORMAL LOW (ref 101–111)
Creatinine, Ser: 0.65 mg/dL (ref 0.61–1.24)
GFR calc Af Amer: >60 mL/min (ref >60)
GFR calc non Af Amer: >60 mL/min (ref >60)
GLUCOSE: 158 mg/dL — AB (ref 65–99)
Potassium: 3 mmol/L — ABNORMAL LOW (ref 3.5–5.1)
Sodium: 133 mmol/L — ABNORMAL LOW (ref 135–145)

## 2017-02-05 LAB — HIV ANTIBODY (ROUTINE TESTING W REFLEX): HIV SCREEN 4TH GENERATION: NONREACTIVE

## 2017-02-05 LAB — SURGICAL PCR SCREEN
MRSA, PCR: NEGATIVE
Staphylococcus aureus: NEGATIVE

## 2017-02-05 LAB — GLUCOSE, CAPILLARY
GLUCOSE-CAPILLARY: 171 mg/dL — AB (ref 65–99)
GLUCOSE-CAPILLARY: 180 mg/dL — AB (ref 65–99)
Glucose-Capillary: 337 mg/dL — ABNORMAL HIGH (ref 65–99)
Glucose-Capillary: 142 mg/dL — ABNORMAL HIGH (ref 65–99)
Glucose-Capillary: 195 mg/dL — ABNORMAL HIGH (ref 65–99)

## 2017-02-05 LAB — PROTIME-INR
INR: 1.06
PROTHROMBIN TIME: 13.8 s (ref 11.4–15.2)

## 2017-02-05 LAB — PREPARE RBC (CROSSMATCH)

## 2017-02-05 LAB — ABO/RH: ABO/RH(D): A POS

## 2017-02-05 SURGERY — REVISION, AMPUTATION SITE
Anesthesia: General | Site: Knee | Laterality: Right

## 2017-02-05 MED ORDER — SODIUM CHLORIDE 0.9 % IV SOLN: Freq: Once | INTRAVENOUS | Status: AC

## 2017-02-05 MED ORDER — METOCLOPRAMIDE HCL 5 MG/ML IJ SOLN: 5.0000 mg | Freq: Three times a day (TID) | INTRAMUSCULAR | Status: DC | PRN

## 2017-02-05 MED ORDER — PROMETHAZINE HCL 25 MG/ML IJ SOLN: 6.2500 mg | INTRAMUSCULAR | Status: DC | PRN

## 2017-02-05 MED ORDER — FENTANYL CITRATE (PF) 100 MCG/2ML IJ SOLN
INTRAMUSCULAR | Status: DC | PRN
  Administered 2017-02-05 (×2): 50 ug via INTRAVENOUS

## 2017-02-05 MED ORDER — PHENYLEPHRINE HCL 10 MG/ML IJ SOLN
INTRAVENOUS | Status: DC | PRN
  Administered 2017-02-05: 50 ug/min via INTRAVENOUS

## 2017-02-05 MED ORDER — PHENYLEPHRINE HCL 10 MG/ML IJ SOLN
INTRAMUSCULAR | Status: DC | PRN
  Administered 2017-02-05: 80 ug via INTRAVENOUS
  Administered 2017-02-05: 120 ug via INTRAVENOUS

## 2017-02-05 MED ORDER — MIDAZOLAM HCL 5 MG/5ML IJ SOLN
INTRAMUSCULAR | Status: DC | PRN
  Administered 2017-02-05: 2 mg via INTRAVENOUS

## 2017-02-05 MED ORDER — POVIDONE-IODINE 10 % EX SWAB: 2.0000 "application " | Freq: Once | CUTANEOUS | Status: DC

## 2017-02-05 MED ORDER — CHLORHEXIDINE GLUCONATE 4 % EX LIQD
60.0000 mL | Freq: Once | CUTANEOUS | Status: AC
  Administered 2017-02-05: 4 via TOPICAL

## 2017-02-05 MED ORDER — METHOCARBAMOL 1000 MG/10ML IJ SOLN
500.0000 mg | Freq: Four times a day (QID) | INTRAVENOUS | Status: DC | PRN
  Filled 2017-02-05: qty 5

## 2017-02-05 MED ORDER — ONDANSETRON HCL 4 MG/2ML IJ SOLN
4.0000 mg | Freq: Four times a day (QID) | INTRAMUSCULAR | Status: DC | PRN
Start: 2017-02-05 — End: 2017-02-07

## 2017-02-05 MED ORDER — DEXTROSE 5 % IV SOLN
2.0000 g | INTRAVENOUS | Status: DC
  Administered 2017-02-05 – 2017-02-08 (×4): 2 g via INTRAVENOUS
  Filled 2017-02-05 (×4): qty 2

## 2017-02-05 MED ORDER — LIDOCAINE HCL (CARDIAC) 20 MG/ML IV SOLN
INTRAVENOUS | Status: DC | PRN
  Administered 2017-02-05: 80 mg via INTRAVENOUS

## 2017-02-05 MED ORDER — LACTATED RINGERS IV SOLN
INTRAVENOUS | Status: DC
  Administered 2017-02-05 – 2017-02-07 (×3): via INTRAVENOUS

## 2017-02-05 MED ORDER — FENTANYL CITRATE (PF) 100 MCG/2ML IJ SOLN
INTRAMUSCULAR | Status: AC
  Filled 2017-02-05: qty 2

## 2017-02-05 MED ORDER — PROPOFOL 10 MG/ML IV BOLUS
INTRAVENOUS | Status: AC
  Filled 2017-02-05: qty 20

## 2017-02-05 MED ORDER — ACETAMINOPHEN 325 MG PO TABS: 650.0000 mg | ORAL_TABLET | Freq: Four times a day (QID) | ORAL | Status: DC | PRN

## 2017-02-05 MED ORDER — ACETAMINOPHEN 650 MG RE SUPP: 650.0000 mg | Freq: Four times a day (QID) | RECTAL | Status: DC | PRN

## 2017-02-05 MED ORDER — DOCUSATE SODIUM 100 MG PO CAPS
100.0000 mg | ORAL_CAPSULE | Freq: Two times a day (BID) | ORAL | Status: DC
  Administered 2017-02-05 – 2017-02-06 (×2): 100 mg via ORAL
  Filled 2017-02-05 (×3): qty 1

## 2017-02-05 MED ORDER — TRANEXAMIC ACID 1000 MG/10ML IV SOLN
2000.0000 mg | INTRAVENOUS | Status: AC
  Filled 2017-02-05: qty 20

## 2017-02-05 MED ORDER — SODIUM CHLORIDE 0.9 % IR SOLN
Status: DC | PRN
  Administered 2017-02-05: 1000 mL
  Administered 2017-02-05: 3000 mL

## 2017-02-05 MED ORDER — ALBUMIN HUMAN 5 % IV SOLN
INTRAVENOUS | Status: DC | PRN
  Administered 2017-02-05 (×2): via INTRAVENOUS

## 2017-02-05 MED ORDER — BISACODYL 10 MG RE SUPP: 10.0000 mg | Freq: Every day | RECTAL | Status: DC | PRN

## 2017-02-05 MED ORDER — TRANEXAMIC ACID 1000 MG/10ML IV SOLN
INTRAVENOUS | Status: DC | PRN
  Administered 2017-02-05: 2000 mg via TOPICAL

## 2017-02-05 MED ORDER — POLYETHYLENE GLYCOL 3350 17 G PO PACK: 17.0000 g | PACK | Freq: Every day | ORAL | Status: DC | PRN

## 2017-02-05 MED ORDER — MAGNESIUM CITRATE PO SOLN: 1.0000 | Freq: Once | ORAL | Status: DC | PRN

## 2017-02-05 MED ORDER — INSULIN ASPART 100 UNIT/ML ~~LOC~~ SOLN
0.0000 [IU] | SUBCUTANEOUS | Status: DC
  Administered 2017-02-05: 2 [IU] via SUBCUTANEOUS
  Administered 2017-02-05: 7 [IU] via SUBCUTANEOUS
  Administered 2017-02-06 (×2): 3 [IU] via SUBCUTANEOUS
  Administered 2017-02-06: 7 [IU] via SUBCUTANEOUS

## 2017-02-05 MED ORDER — MIDAZOLAM HCL 2 MG/2ML IJ SOLN
INTRAMUSCULAR | Status: AC
  Filled 2017-02-05: qty 2

## 2017-02-05 MED ORDER — ONDANSETRON HCL 4 MG/2ML IJ SOLN
INTRAMUSCULAR | Status: DC | PRN
  Administered 2017-02-05: 4 mg via INTRAVENOUS

## 2017-02-05 MED ORDER — SODIUM CHLORIDE 0.9 % IV SOLN: INTRAVENOUS | Status: DC

## 2017-02-05 MED ORDER — LACTATED RINGERS IV SOLN
INTRAVENOUS | Status: DC
  Administered 2017-02-05: 16:00:00 via INTRAVENOUS

## 2017-02-05 MED ORDER — METHOCARBAMOL 500 MG PO TABS
500.0000 mg | ORAL_TABLET | Freq: Four times a day (QID) | ORAL | Status: DC | PRN
  Administered 2017-02-05 – 2017-02-10 (×10): 500 mg via ORAL
  Filled 2017-02-05 (×10): qty 1

## 2017-02-05 MED ORDER — 0.9 % SODIUM CHLORIDE (POUR BTL) OPTIME
TOPICAL | Status: DC | PRN
  Administered 2017-02-05: 1000 mL

## 2017-02-05 MED ORDER — HYDROMORPHONE HCL 1 MG/ML IJ SOLN: 0.2500 mg | INTRAMUSCULAR | Status: DC | PRN

## 2017-02-05 MED ORDER — PHENYLEPHRINE 40 MCG/ML (10ML) SYRINGE FOR IV PUSH (FOR BLOOD PRESSURE SUPPORT)
PREFILLED_SYRINGE | INTRAVENOUS | Status: AC
  Filled 2017-02-05: qty 10

## 2017-02-05 MED ORDER — NICOTINE 21 MG/24HR TD PT24
21.0000 mg | MEDICATED_PATCH | Freq: Every day | TRANSDERMAL | Status: DC
  Administered 2017-02-05 – 2017-02-10 (×5): 21 mg via TRANSDERMAL
  Filled 2017-02-05 (×7): qty 1

## 2017-02-05 MED ORDER — OXYCODONE HCL 5 MG PO TABS
5.0000 mg | ORAL_TABLET | ORAL | Status: DC | PRN
  Administered 2017-02-07 (×2): 5 mg via ORAL
  Filled 2017-02-05 (×2): qty 1

## 2017-02-05 MED ORDER — ONDANSETRON HCL 4 MG PO TABS: 4.0000 mg | ORAL_TABLET | Freq: Four times a day (QID) | ORAL | Status: DC | PRN

## 2017-02-05 MED ORDER — PROPOFOL 10 MG/ML IV BOLUS
INTRAVENOUS | Status: DC | PRN
  Administered 2017-02-05: 180 mg via INTRAVENOUS

## 2017-02-05 MED ORDER — METOCLOPRAMIDE HCL 5 MG PO TABS: 5.0000 mg | ORAL_TABLET | Freq: Three times a day (TID) | ORAL | Status: DC | PRN

## 2017-02-05 MED ORDER — MORPHINE SULFATE (PF) 4 MG/ML IV SOLN
4.0000 mg | INTRAVENOUS | Status: DC | PRN
  Administered 2017-02-05 (×3): 4 mg via INTRAVENOUS
  Filled 2017-02-05 (×3): qty 1

## 2017-02-05 MED ORDER — MORPHINE SULFATE (PF) 4 MG/ML IV SOLN
4.0000 mg | INTRAVENOUS | Status: DC | PRN
  Administered 2017-02-05 – 2017-02-08 (×22): 4 mg via INTRAVENOUS
  Filled 2017-02-05 (×22): qty 1

## 2017-02-05 SURGICAL SUPPLY — 40 items
APL SKNCLS STERI-STRIP NONHPOA (GAUZE/BANDAGES/DRESSINGS) ×1
BENZOIN TINCTURE PRP APPL 2/3 (GAUZE/BANDAGES/DRESSINGS) ×2 IMPLANT
BLADE SAW RECIP 87.9 MT (BLADE) ×2 IMPLANT
BLADE SURG 21 STRL SS (BLADE) ×3 IMPLANT
CASSETTE VERAFLO VERALINK (MISCELLANEOUS) ×2 IMPLANT
COVER SURGICAL LIGHT HANDLE (MISCELLANEOUS) ×3 IMPLANT
DRAPE EXTREMITY T 121X128X90 (DRAPE) ×3 IMPLANT
DRAPE INCISE IOBAN 66X45 STRL (DRAPES) ×7 IMPLANT
DRAPE PROXIMA HALF (DRAPES) ×3 IMPLANT
DRAPE U-SHAPE 47X51 STRL (DRAPES) ×6 IMPLANT
DRSG CLEANSE VERAFLOW (GAUZE/BANDAGES/DRESSINGS) ×2 IMPLANT
DRSG VAC ATS MED SENSATRAC (GAUZE/BANDAGES/DRESSINGS) ×3 IMPLANT
DRSG VERAFLO VAC MED (GAUZE/BANDAGES/DRESSINGS) ×2 IMPLANT
DURAPREP 26ML APPLICATOR (WOUND CARE) ×3 IMPLANT
ELECT REM PT RETURN 9FT ADLT (ELECTROSURGICAL) ×3
ELECTRODE REM PT RTRN 9FT ADLT (ELECTROSURGICAL) ×1 IMPLANT
GLOVE BIOGEL PI IND STRL 9 (GLOVE) ×1 IMPLANT
GLOVE BIOGEL PI INDICATOR 9 (GLOVE) ×2
GLOVE SURG ORTHO 9.0 STRL STRW (GLOVE) ×3 IMPLANT
GOWN STRL REUS W/ TWL XL LVL3 (GOWN DISPOSABLE) ×2 IMPLANT
GOWN STRL REUS W/TWL XL LVL3 (GOWN DISPOSABLE) ×6
HANDPIECE INTERPULSE COAX TIP (DISPOSABLE) ×3
KIT BASIN OR (CUSTOM PROCEDURE TRAY) ×3 IMPLANT
KIT ROOM TURNOVER OR (KITS) ×3 IMPLANT
MANIFOLD NEPTUNE II (INSTRUMENTS) ×3 IMPLANT
NDL SPNL 18GX3.5 QUINCKE PK (NEEDLE) IMPLANT
NEEDLE SPNL 18GX3.5 QUINCKE PK (NEEDLE) ×3 IMPLANT
NS IRRIG 1000ML POUR BTL (IV SOLUTION) ×3 IMPLANT
PACK GENERAL/GYN (CUSTOM PROCEDURE TRAY) ×3 IMPLANT
PAD ARMBOARD 7.5X6 YLW CONV (MISCELLANEOUS) ×3 IMPLANT
PAD NEG PRESSURE SENSATRAC (MISCELLANEOUS) IMPLANT
SET HNDPC FAN SPRY TIP SCT (DISPOSABLE) IMPLANT
STAPLER VISISTAT 35W (STAPLE) IMPLANT
SUT ETHILON 2 0 PSLX (SUTURE) ×10 IMPLANT
SUT SILK 2 0 (SUTURE) ×3
SUT SILK 2-0 18XBRD TIE 12 (SUTURE) IMPLANT
SWAB CULTURE LIQ STUART DBL (MISCELLANEOUS) ×2 IMPLANT
SYR 50ML LL SCALE MARK (SYRINGE) ×2 IMPLANT
TOWEL OR 17X26 10 PK STRL BLUE (TOWEL DISPOSABLE) ×3 IMPLANT
TUBE ANAEROBIC SPECIMEN COL (MISCELLANEOUS) ×2 IMPLANT

## 2017-02-05 NOTE — Care Management Obs Status (Signed)
MEDICARE OBSERVATION STATUS NOTIFICATION   Patient Details  Name: Derrick [name] MRN: [id number] Date of Birth: 10/07/1971   Medicare Observation Status Notification Given:  Yes    Hanley HaysDowell, [place] Stare T, RN 02/05/2017, 9:41 AM

## 2017-02-05 NOTE — Progress Notes (Signed)
Family Medicine Teaching Service Daily Progress Note Intern Pager: [phone]  Patient name: Derrick [name] Medical record number: [id number] Date of birth: 05/02/1971 Age: 46 y.o. Gender: male  Primary Care Provider: Pcp Not In System Consultants: Orthopedics Code Status: DNR  Pt Overview and Major Events to Date:  2/18 admit to FPTS 2/19 Plan for surgical I&D of right AKA by Ortho  Assessment and Plan: Derrick [name] is a 46 y.o. male presenting with right AKA pain, bleeding, and possible infection. PMH is significant for poorly controlled DM, smoker.   Right AKA stump wound, stable: Day #5 s/p R AKA on 2/14 due to infected R BKA. Wound culture from 2/14: group B strep, possible anaerobe, GN rods and GP cocci on gram stain. Was taking Augmentin (started 2/17) and Ciprofloxacin at home.  - s/p Unasyn x 1 in ED, now on Zosyn and Levaquin  - Consider if Linezolid needed for MRSA coverage (pt allergic to vanc) - blood cultures pending - Ortho planning for surgical I&D and placement of wound vac this afternoon - Added on PT/INR - Pt NPO  - will f/u ortho recs after surgery  DM2, uncontrolled A1c 14.5 on 11/2017. Does not take his home insulin. CBG 284, 337 overnight.  - Lantus 7 units - Sensitive SSI - CBG q4H while NPO for surgery  Elevated Platelets: possibly due to recent surgery and infection (acute phase reactant) - monitor   Tobacco Use: 2ppd - declined Nicotine patch   FEN/GI: carb modified, NPO at midnight for procedure today Prophylaxis: holding for surgery  Disposition: Home pending clinical improvement  Subjective:  No acute events overnight. Patient complains of right AKA tenderness this morning. No other complaints.  Objective: Temp:  [97.8 F (36.6 C)-98.1 F (36.7 C)] 98.1 F (36.7 C) (02/19 0525) Pulse Rate:  [93-107] 94 (02/19 0525) Resp:  [18-20] 20 (02/18 2028) BP: (112-144)/(54-94) 112/54 (02/19 0525) SpO2:  [99 %-100 %] 99 % (02/19  0525) Weight:  [200 lb (90.7 kg)] 200 lb (90.7 kg) (02/18 1739) Physical Exam: General: NAD, sleeps comfortably, wakes easily Cardiovascular: RRR, no m/r/g Respiratory: CTA bil no W/R/R Abdomen: soft, nt nd, normoactive BS Extremities: Left BKA. Right AKA with sutures noted at the stump, dried blood near the sutures and blood noted on the bed. No erythema or edema   Laboratory:  Recent Labs Lab 02/04/17 1753  WBC 41.4*  HGB 11.4*  HCT 32.9*  PLT 1,001*    Recent Labs Lab 02/04/17 1753 02/05/17 0752  NA 131* 133*  K 3.7 3.0*  CL 89* 96*  CO2 23 27  BUN 10 9  CREATININE 0.85 0.65  CALCIUM 8.8* 8.1*  GLUCOSE 370* 158*    Imaging/Diagnostic Tests: No results found.   Howard PouchLauren Ymani Porcher, MD 02/05/2017, 9:44 AM PGY-1, [facility] FPTS Intern pager: [phone], text pages welcome

## 2017-02-05 NOTE — Progress Notes (Signed)
Pharmacy Antibiotic Note  Derrick RoseBobby L [name] is a 46 y.o. 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  Was on Zosyn + Levquin, now to transition to ceftriaxone. S/p revision of R AKA and application of wound vac today.  Renal function stable.  Plan: -Ceftriaxone 2g IV q24h -Follow c/s, renal function, LOT  Height: 6\' 1"  (185.4 cm) Weight: 200 lb (90.7 kg) IBW/kg (Calculated) : 79.9  Temp (24hrs), Avg:98.4 F (36.9 C), Min:97.9 F (36.6 C), Max:99 F (37.2 C)   Recent Labs Lab 02/04/17 1753 02/04/17 1807 02/04/17 2119 02/05/17 0752  WBC 41.4*  --   --   --   CREATININE 0.85  --   --  0.65  LATICACIDVEN  --  2.51* 1.32  --     Estimated Creatinine Clearance: 133.2 mL/min (by C-G formula based on SCr of 0.65 mg/dL).    Allergies  Allergen Reactions  . Vancomycin Other (See Comments)    Acute kidney injury    Antimicrobials this admission: Unasyn 2/18 x 1 Zosyn 2/18 >> 2/19 Levaquin 2/18>> 2/19 Ceftriaxone 2/19 >>  Dose adjustments this admission:   Microbiology results: 2/18 blood x2: ngtd 2/19 wound (intra-op): sent  Thank you for allowing pharmacy to be a part of this patient's care.  Ormand Senn D. Zaul Hubers, PharmD, BCPS Clinical Pharmacist Pager: [phone] 02/05/2017 6:06 PM

## 2017-02-05 NOTE — Anesthesia Procedure Notes (Signed)
Procedure Name: LMA Insertion Date/Time: 02/05/2017 4:30 PM Performed by: Tillman AbideHAWKINS, Trinten Boudoin B Pre-anesthesia Checklist: Patient identified, Emergency Drugs available, Suction available and Patient being monitored Patient Re-evaluated:Patient Re-evaluated prior to inductionOxygen Delivery Method: Circle System Utilized Preoxygenation: Pre-oxygenation with 100% oxygen Intubation Type: IV induction Ventilation: Mask ventilation without difficulty LMA: LMA inserted LMA Size: 4.0 Number of attempts: 1 Placement Confirmation: positive ETCO2 Tube secured with: Tape Dental Injury: Teeth and Oropharynx as per pre-operative assessment

## 2017-02-05 NOTE — Consult Note (Signed)
ORTHOPAEDIC CONSULTATION  REQUESTING PHYSICIAN: Lupita Dawn, MD  Chief Complaint: Pain and drainage from right above-the-knee amputation.  HPI: Derrick [name] is a 46 y.o. male who presents with recurrent infection right above-the-knee amputation. Patient is an uncontrolled diabetic smokes and drinks [facility]. Patient will not take diabetic medicine will not stop smoking. Patient has refused admissions after his previous surgeries due to the fact that he wants to continue smoking and states that he cannot smoke in the hospital.  Past Medical History:  Diagnosis Date  . Anemia   . Arthritis   . Diabetes mellitus    Type II  . GERD (gastroesophageal reflux disease)   . Neuropathy ([place])   . Peripheral neuropathy ([place])   . Pneumonia   . Prostatitis    Past Surgical History:  Procedure Laterality Date  . AMPUTATION  05/31/2012   Procedure: AMPUTATION DIGIT;  Surgeon: Newt Minion, MD;  Location: [place];  Service: Orthopedics;  Laterality: Right;  right fourth and fifth toe ray amputation   . AMPUTATION Right 12/14/2016   Procedure: AMPUTATION BELOW KNEE RIGHT;  Surgeon: Newt Minion, MD;  Location: [place];  Service: Orthopedics;  Laterality: Right;  . AMPUTATION Right 01/31/2017   Procedure: Right Above Knee Amputation;  Surgeon: Newt Minion, MD;  Location: [place];  Service: Orthopedics;  Laterality: Right;  . I&D EXTREMITY  08/02/2012   Procedure: IRRIGATION AND DEBRIDEMENT EXTREMITY;  Surgeon: Newt Minion, MD;  Location: [place];  Service: Orthopedics;  Laterality: Right;  Right Foot Irrigation and Debridement, Place Antibiotic Beads  . SHOULDER SURGERY Bilateral    rotarcuff  . STUMP REVISION Left 07/12/2016   Procedure: Revision Left Below Knee Amputation;  Surgeon: Newt Minion, MD;  Location: [place];  Service: Orthopedics;  Laterality: Left;  . STUMP REVISION Right 01/24/2017   Procedure: Revision Right Below Knee Amputation;  Surgeon: Newt Minion, MD;  Location: [place];  Service: Orthopedics;  Laterality: Right;  . TOE AMPUTATION     multiple toes amputated.   Social History   Social History  . Marital status: Married    Spouse name: N/A  . Number of children: N/A  . Years of education: N/A   Social History Main Topics  . Smoking status: Current Every Day Smoker    Packs/day: 2.00    Years: 24.00    Types: Cigarettes  . Smokeless tobacco: Never Used  . Alcohol use No  . Drug use: No  . Sexual activity: Yes    Birth control/ protection: None   Other Topics Concern  . None   Social History Narrative  . None   Family History  Problem Relation Age of Onset  . Cancer Mother   . Congestive Heart Failure Father    - negative except otherwise stated in the family history section Allergies  Allergen Reactions  . Vancomycin Other (See Comments)    Acute kidney injury   Prior to Admission medications   Medication Sig Start Date End Date Taking? Authorizing Provider  acetaminophen (TYLENOL) 325 MG tablet Take 2 tablets (650 mg total) by mouth every 6 (six) hours as needed for mild pain or fever (or Fever >/= 101). Acetaminophen dose from all sources not to exceed 4 g per day. 12/15/16  Yes Modena Jansky, MD  amoxicillin-clavulanate (AUGMENTIN) 875-125 MG tablet Take 1 tablet by mouth 2 (two) times daily. 02/02/17  Yes Suzan Slick, NP  doxycycline (VIBRA-TABS) 100  MG tablet Take 1 tablet (100 mg total) by mouth 2 (two) times daily. 01/22/17  Yes Newt Minion, MD  oxyCODONE-acetaminophen (PERCOCET/ROXICET) 5-325 MG tablet Take 1 tablet by mouth every 4 (four) hours as needed for moderate pain or severe pain. 01/31/17  Yes Newt Minion, MD  promethazine (PHENERGAN) 25 MG tablet Take 25 mg by mouth every 6 (six) hours as needed for nausea or vomiting.   Yes Historical Provider, MD  blood glucose meter kit and supplies KIT Dispense based on patient and insurance preference. Use up to four times daily as directed. (FOR ICD-9 250.00, 250.01).  12/15/16   Modena Jansky, MD  ciprofloxacin (CIPRO) 500 MG tablet Take 1 tablet (500 mg total) by mouth 2 (two) times daily. Patient not taking: Reported on 02/04/2017 12/15/16   Modena Jansky, MD  gabapentin (NEURONTIN) 100 MG capsule Take 1 capsule (100 mg total) by mouth 3 (three) times daily. Patient not taking: Reported on 02/04/2017 01/06/17   Suzan Slick, NP  insulin aspart (NOVOLOG FLEXPEN) 100 UNIT/ML FlexPen Inject 0-15 Units into the skin 3 (three) times daily with meals. CBG < 70: eat or drink something and recheck. CBG 70 - 120: 0 units CBG 121 - 150: 2 units CBG 151 - 200: 3 units CBG 201 - 250: 5 units CBG 251 - 300: 8 units CBG 301 - 350: 11 units CBG 351 - 400: 15 units CBG > 400: call MD. Patient not taking: Reported on 02/04/2017 12/15/16   Modena Jansky, MD  Insulin Glargine (LANTUS SOLOSTAR) 100 UNIT/ML Solostar Pen Inject 20 Units into the skin daily at 10 pm. Patient not taking: Reported on 02/04/2017 12/15/16   Modena Jansky, MD  Insulin Pen Needle (PEN NEEDLES 3/16") 31G X 5 MM MISC Use as directed 3 times daily with meals and at bedtime. Patient not taking: Reported on 02/04/2017 12/15/16   Modena Jansky, MD  lisinopril (PRINIVIL,ZESTRIL) 10 MG tablet Take 1 tablet (10 mg total) by mouth daily. Patient not taking: Reported on 02/04/2017 12/16/16   Modena Jansky, MD   No results found. - pertinent xrays, CT, MRI studies were reviewed and independently interpreted  Positive ROS: All other systems have been reviewed and were otherwise negative with the exception of those mentioned in the HPI and as above.  Physical Exam: General: Alert, no acute distress Psychiatric: Patient is competent for consent with normal mood and affect Lymphatic: No axillary or cervical lymphadenopathy Cardiovascular: No Lower extremity edema Respiratory: No cyanosis, no use of accessory musculature GI: No organomegaly, abdomen is soft and non-tender  Skin:  The wound  edges are well approximated there is drainage from the lateral aspect of the incision. There is no cellulitis.   Neurologic: Patient does not have protective sensation bilateral lower extremities.   MUSCULOSKELETAL:  On examination patient has bloody drainage from the lateral incision there is no purulence at this time however patient has a white blood cell count above 40,000 patient complains of increased pain.  Assessment: Assessment: Uncontrolled diabetes with persistent smoking and persistent infection right above-the-knee amputation with sepsis.  Plan: Plan: Discussed we will proceed with surgery this afternoon for irrigation and debridement of the right above-the-knee amputation placement of an infusion wound VAC plan for return to surgery on Wednesday. Discussed the importance of diabetic control smoking cessation and not drinking [facility].  Thank you for the consult and the opportunity to see Mr. Lucile Shutters, MD [facility]  [phone] 6:38 AM

## 2017-02-05 NOTE — Progress Notes (Signed)
Inpatient Diabetes Program Recommendations  AACE/ADA: New Consensus Statement on Inpatient Glycemic Control (2015)  Target Ranges:  Prepandial:   less than 140 mg/dL      Peak postprandial:   less than 180 mg/dL (1-2 hours)      Critically ill patients:  140 - 180 mg/dL   Lab Results  Component Value Date   GLUCAP 337 (H) 02/05/2017   HGBA1C 14.5 (H) 12/13/2016    Diabetes history:     DM, DM foot ulcer Outpatient Diabetes medications:     None ( stated "does not take at home", previously refusing to take "until he gets a PCP" on 12/13/2016 when this RN last spoke with patient) Current orders for Inpatient glycemic control:    NPO    Lantus 7 units QHS    Novolog 0-9 units Q4H  Inpatient Diabetes Program Recommendations:    This RN spoke with patient at bedside on 12/13/2016 regarding home care management and, based on this discussion, believe that placement in SNF may be necessary.  Because of an A1C of 14.5 coupled with patient's noncompliance with home medical management, this RN is concerned that patient's wounds will not heal unless patient is receiving 24 hour/day cares.  Thank you,  Kristine LineaKaren Brette Cast, RN, MSN Diabetes Coordinator Inpatient Diabetes Program [phone] (Team Pager)

## 2017-02-05 NOTE — Progress Notes (Signed)
FPTS Interim Progress Note  S: Patient complains of some pain on RL stump.  No other concerns at this time.  He verbalizes understanding of plan for possible repeat I&D Wednesday.  O: BP 111/73 (BP Location: Right Arm)   Pulse (!) 108   Temp 97.9 F (36.6 C) (Oral)   Resp 16   Ht 6\' 1"  (1.854 m)   Wt 200 lb (90.7 kg)   SpO2 98%   BMI 26.39 kg/m   Gen: family at bedside, patient awake, alert, NAD Cardio: slightly tachycardic Pulm: normal WOB on room air Ext: b/l LE amputations.  Right stump with wound vac in place.  A/P: Derrick [name] is a 46 y.o. male here for right AKA wound infection s/p I&D in the surgical suite.  Plan is for possible repeat I&D on Wed.  Per surgical note, if persistent infection may need to consider hip disarticulation.   - Norco and Percocet on board for pain control - Monitor for signs and symptoms of persistent infection - Day team to reassess in am  Raliegh IpAshly M Gottschalk, DO 02/05/2017, 7:38 PM PGY-3, [facility] pager [phone]

## 2017-02-05 NOTE — Anesthesia Preprocedure Evaluation (Signed)
Anesthesia Evaluation  Patient identified by MRN, date of birth, ID band Patient awake    Reviewed: Allergy & Precautions, NPO status , Patient's Chart, lab work & pertinent test results  Airway Mallampati: II  TM Distance: >3 FB Neck ROM: Full    Dental  (+) Partial Lower, Poor Dentition   Pulmonary pneumonia, resolved, Current Smoker,    Pulmonary exam normal breath sounds clear to auscultation       Cardiovascular hypertension, Pt. on medications Normal cardiovascular exam Rhythm:Regular Rate:Normal     Neuro/Psych Diabetic neuropathy  Neuromuscular disease    GI/Hepatic GERD  Medicated and Controlled,  Endo/Other  diabetes, Poorly Controlled, Type 2, Insulin Dependent, Oral Hypoglycemic AgentsNon compliant with Insulin  Renal/GU    Hx/o Prostatitis    Musculoskeletal  (+) Arthritis , Osteoarthritis,  Infected right BKA with wound dehiscence   Abdominal   Peds  Hematology  (+) anemia , Sepsis   Anesthesia Other Findings   Reproductive/Obstetrics                             Anesthesia Physical Anesthesia Plan  ASA: III  Anesthesia Plan: General   Post-op Pain Management:    Induction: Intravenous  Airway Management Planned: LMA  Additional Equipment:   Intra-op Plan:   Post-operative Plan: Extubation in OR  Informed Consent: I have reviewed the patients History and Physical, chart, labs and discussed the procedure including the risks, benefits and alternatives for the proposed anesthesia with the patient or authorized representative who has indicated his/her understanding and acceptance.     Plan Discussed with: CRNA  Anesthesia Plan Comments:         Anesthesia Quick Evaluation

## 2017-02-05 NOTE — Transfer of Care (Signed)
Immediate Anesthesia Transfer of Care Note  Patient: Derrick [name]  Procedure(s) Performed: Procedure(s): REVISION RIGHT AKA (Right) APPLICATION OF WOUND VAC (Right)  Patient Location: PACU  Anesthesia Type:General  Level of Consciousness: patient cooperative, drowsy  Airway & Oxygen Therapy: Patient Spontanous Breathing  Post-op Assessment: Report given to RN and Post -op Vital signs reviewed and stable  Post vital signs: Reviewed and stable  Last Vitals:  Vitals:   02/05/17 0525 02/05/17 1401  BP: (!) 112/54 113/67  Pulse: 94 99  Resp:  20  Temp: 36.7 C 36.9 C    Last Pain:  Vitals:   02/05/17 1401  TempSrc: Oral  PainSc:          Complications: No apparent anesthesia complications

## 2017-02-05 NOTE — Op Note (Signed)
02/04/2017 - 02/05/2017  5:47 PM  PATIENT:  Derrick [name]    PRE-OPERATIVE DIAGNOSIS:  Sepsis with Infected Residual Limb  POST-OPERATIVE DIAGNOSIS:  Same  PROCEDURE:  REVISION RIGHT AKA, APPLICATION OF WOUND VAC  SURGEON:  Nadara MustardMarcus V Terri Malerba, MD  PHYSICIAN ASSISTANT:None ANESTHESIA:   General  PREOPERATIVE INDICATIONS:  Derrick [name] is a  46 y.o. male with a diagnosis of Sepsis with Infected Residual Limb who failed conservative measures and elected for surgical management.    The risks benefits and alternatives were discussed with the patient preoperatively including but not limited to the risks of infection, bleeding, nerve injury, cardiopulmonary complications, the need for revision surgery, among others, and the patient was willing to proceed.  OPERATIVE IMPLANTS: Instillation wound VAC with 50 mL of irrigation  OPERATIVE FINDINGS: Essentially all the hamstring muscles were infected.  OPERATIVE PROCEDURE: Patient brought the operating room and underwent a general anesthetic. After adequate levels anesthesia obtained patient's right lower extremity was prepped using DuraPrep draped into a sterile field. A timeout was called. Elliptical incision was made around his previous incision. There was more purulence. This fluid was sent for cultures. Approximately 4 inches of the distal femur was resected muscle was resected back to healthy viable contract tile muscle. Essentially all the hamstring muscles were involved with infection in the abscess. The quad muscles were not involved. 2-0 silk was used for hemostasis for the femoral artery. The wound was irrigated with normal saline. The wound was then packed with the vera flow wound VAC sponge the incision was closed using 2-0 nylon a impervious wound VAC drape was applied this had a good suction fit irrigation was set for 50 mL. Patient was extubated taken the PACU in stable condition.  Plan for return to the operating room on Wednesday for  repeat irrigation and debridement if there is recurrence of the infection we will need to proceed with a hip disarticulation.

## 2017-02-05 NOTE — Progress Notes (Signed)
Pt. refused to have vital signs taken. Will allow pt to rest and retry in the early morning.

## 2017-02-06 ENCOUNTER — Encounter (HOSPITAL_COMMUNITY): Payer: Self-pay | Admitting: Orthopedic Surgery

## 2017-02-06 DIAGNOSIS — L089 Local infection of the skin and subcutaneous tissue, unspecified: Secondary | ICD-10-CM

## 2017-02-06 DIAGNOSIS — T148XXA Other injury of unspecified body region, initial encounter: Secondary | ICD-10-CM

## 2017-02-06 DIAGNOSIS — G8918 Other acute postprocedural pain: Secondary | ICD-10-CM

## 2017-02-06 LAB — AEROBIC/ANAEROBIC CULTURE (SURGICAL/DEEP WOUND)

## 2017-02-06 LAB — BASIC METABOLIC PANEL
ANION GAP: 10 (ref 5–15)
BUN: 9 mg/dL (ref 6–20)
CALCIUM: 7.8 mg/dL — AB (ref 8.9–10.3)
CO2: 26 mmol/L (ref 22–32)
Chloride: 94 mmol/L — ABNORMAL LOW (ref 101–111)
Creatinine, Ser: 0.71 mg/dL (ref 0.61–1.24)
GFR calc Af Amer: >60 mL/min (ref >60)
Glucose, Bld: 295 mg/dL — ABNORMAL HIGH (ref 65–99)
POTASSIUM: 3 mmol/L — AB (ref 3.5–5.1)
SODIUM: 130 mmol/L — AB (ref 135–145)

## 2017-02-06 LAB — GLUCOSE, CAPILLARY
GLUCOSE-CAPILLARY: 211 mg/dL — AB (ref 65–99)
GLUCOSE-CAPILLARY: 221 mg/dL — AB (ref 65–99)
GLUCOSE-CAPILLARY: 263 mg/dL — AB (ref 65–99)
GLUCOSE-CAPILLARY: 317 mg/dL — AB (ref 65–99)
Glucose-Capillary: 203 mg/dL — ABNORMAL HIGH (ref 65–99)

## 2017-02-06 LAB — CBC
HCT: 22.1 % — ABNORMAL LOW (ref 39.0–52.0)
Hemoglobin: 7.2 g/dL — ABNORMAL LOW (ref 13.0–17.0)
MCH: 25.2 pg — AB (ref 26.0–34.0)
MCHC: 32.6 g/dL (ref 30.0–36.0)
MCV: 77.3 fL — ABNORMAL LOW (ref 78.0–100.0)
PLATELETS: 670 10*3/uL — AB (ref 150–400)
RBC: 2.86 MIL/uL — ABNORMAL LOW (ref 4.22–5.81)
RDW: 15.8 % — AB (ref 11.5–15.5)
WBC: 40.8 10*3/uL — AB (ref 4.0–10.5)

## 2017-02-06 LAB — AEROBIC/ANAEROBIC CULTURE W GRAM STAIN (SURGICAL/DEEP WOUND)

## 2017-02-06 LAB — PATHOLOGIST SMEAR REVIEW

## 2017-02-06 MED ORDER — INSULIN ASPART 100 UNIT/ML ~~LOC~~ SOLN
0.0000 [IU] | Freq: Three times a day (TID) | SUBCUTANEOUS | Status: DC
  Administered 2017-02-06: 8 [IU] via SUBCUTANEOUS
  Administered 2017-02-07 (×2): 5 [IU] via SUBCUTANEOUS
  Administered 2017-02-08 (×2): 8 [IU] via SUBCUTANEOUS
  Administered 2017-02-08: 3 [IU] via SUBCUTANEOUS
  Administered 2017-02-09: 8 [IU] via SUBCUTANEOUS
  Administered 2017-02-09: 5 [IU] via SUBCUTANEOUS
  Administered 2017-02-09 – 2017-02-10 (×3): 3 [IU] via SUBCUTANEOUS
  Administered 2017-02-10 – 2017-02-11 (×2): 11 [IU] via SUBCUTANEOUS

## 2017-02-06 NOTE — Evaluation (Signed)
Physical Therapy Evaluation Patient Details Name: Derrick [name] MRN: [id number] DOB: 10/25/1971 Today's Date: 02/06/2017   History of Present Illness  45 y.o.malepresenting with right AKA pain, bleeding, and possible infection. Found to have sepsis due to infection and underwend revision of Rt AKA and application of wound vac. Scheduled to return to surgery for I&D 02/07/17. If there is persistent infection, may proceed with a hip disarticulation. PMH: Lt BKA, neuropathy, diabetes, smoker.   Clinical Impression  Pt needing heavy and repeated encouragement by PT and sister to participate in any activity. Pt willing to roll bilaterally and come to long sitting in bed (mod assist needed). Pt states that he is planning to D/C to home with his girlfriend and sister to assist intermittently. They work and the pt will be alone at times.  Pt reports using electric scooter for mobility PTA and he spends the majority of his day in bed. To return to surgery 02/07/17, will progress mobility as appropriate following. Modification to current D/C recommendations may be need depending on outcome of surgery tomorrow.     Follow Up Recommendations Home health PT;Supervision for mobility/OOB    Equipment Recommendations  None recommended by PT    Recommendations for Other Services OT consult     Precautions / Restrictions Precautions Precautions: Fall Precaution Comments: bilateral amputee Required Braces or Orthoses: Other Brace/Splint Other Brace/Splint: has Lt prosthesis at home Restrictions Weight Bearing Restrictions: No      Mobility  Bed Mobility Overal bed mobility: Needs Assistance Bed Mobility: Rolling;Supine to Sit Rolling: Supervision (bilaterally, using rails to assist)   Supine to sit: Mod assist     General bed mobility comments: supine to longsitting, pt using bilateral lower rails and PT assist at trunk. Pt refusing any other activity  Transfers                  General transfer comment: pt refusing  Ambulation/Gait                Stairs            Wheelchair Mobility    Modified Rankin (Stroke Patients Only)       Balance Overall balance assessment: Needs assistance Sitting-balance support: Bilateral upper extremity supported Sitting balance-Leahy Scale: Poor Sitting balance - Comments: in longsitting position in bed                                     Pertinent Vitals/Pain Pain Assessment: 0-10 Pain Score: 8  Pain Location: Rt posterior thigh Pain Descriptors / Indicators: Cramping;Spasm Pain Intervention(s): Limited activity within patient's tolerance;Monitored during session    Home Living Family/patient expects to be discharged to:: Private residence Living Arrangements: Other (Comment) (girlfriend) Available Help at Discharge: Friend(s);Family;Available PRN/intermittently (girlfriend, sister) Type of Home: House Home Access: Ramped entrance     Home Layout: One level Home Equipment: Art gallery managerlectric scooter;Wheelchair - manual Additional Comments: Psychologist, forensicelectric scooter X2    Prior Function Level of Independence: Independent with assistive device(s)         Comments: Reports using scooter for mobility. Was doing scooter<>bed transfers independently. Pt admits that he spends the majority of his day in bed.      Hand Dominance        Extremity/Trunk Assessment   Upper Extremity Assessment Upper Extremity Assessment: Generalized weakness    Lower Extremity Assessment Lower Extremity Assessment: RLE deficits/detail RLE Deficits / Details:  Limited active motion due to reports of pain, able to lift off bed.  LLE Deficits / Details: able to raise and move independently        Communication   Communication: No difficulties  Cognition Arousal/Alertness: Awake/alert Behavior During Therapy: WFL for tasks assessed/performed Overall Cognitive Status: Within Functional Limits for tasks assessed                       General Comments General comments (skin integrity, edema, etc.): Heavy encouragement needed for pt to participate in any kind of activity.     Exercises     Assessment/Plan    PT Assessment Patient needs continued PT services  PT Problem List Decreased strength;Decreased range of motion;Decreased activity tolerance;Decreased balance;Decreased mobility;Pain       PT Treatment Interventions DME instruction;Functional mobility training;Therapeutic activities;Therapeutic exercise;Patient/family education;Wheelchair mobility training    PT Goals (Current goals can be found in the Care Plan section)  Acute Rehab PT Goals Patient Stated Goal: get rid of pain PT Goal Formulation: With patient Time For Goal Achievement: 02/20/17 Potential to Achieve Goals: Good    Frequency Min 3X/week   Barriers to discharge        Co-evaluation               End of Session   Activity Tolerance: Patient limited by pain Patient left: in bed;with call bell/phone within reach;with family/visitor present Nurse Communication: Mobility status PT Visit Diagnosis: Other abnormalities of gait and mobility (R26.89);Muscle weakness (generalized) (M62.81)         Time: 1610-9604 PT Time Calculation (min) (ACUTE ONLY): 34 min   Charges:   PT Evaluation $PT Eval Moderate Complexity: 1 Procedure PT Treatments $Therapeutic Activity: 8-22 mins   PT G Codes:         Christiane Ha, PT, CSCS Pager [phone] Office [phone]  02/06/2017, 12:06 PM

## 2017-02-06 NOTE — Progress Notes (Signed)
Patient ID: Derrick [name], male   DOB: 02/12/1971, 46 y.o.   MRN: [id number] Postoperative day 1 revision right above-knee amputation. Patient had extensive infection involving the hamstring muscles. Repeat cultures were obtained. Hamstring muscles were resected. The wound VAC is functioning well. Blood work is pending. Plan for repeat irrigation and debridement tomorrow. I discussed with the patient if there is persistent infection we would need to proceed with a hip disarticulation.

## 2017-02-06 NOTE — Plan of Care (Signed)
Problem: Safety: Goal: Ability to remain free from injury will improve Outcome: Progressing No safety issues noted  Problem: Pain Managment: Goal: General experience of comfort will improve Outcome: Not Progressing Pain not relieved regardless all pain medications, continuous to C/O pain scale of 9  Problem: Physical Regulation: Goal: Will remain free from infection Outcome: Progressing No S/S of infection noted, VS WNL  Problem: Skin Integrity: Goal: Risk for impaired skin integrity will decrease Outcome: Progressing Wound vac intact to Right AKA, draining serosanguinous drainage  Problem: Bowel/Gastric: Goal: Will not experience complications related to bowel motility Outcome: Progressing Denies gastric and bowel issues

## 2017-02-06 NOTE — Progress Notes (Addendum)
Inpatient Diabetes Program Recommendations  AACE/ADA: New Consensus Statement on Inpatient Glycemic Control (2015)  Target Ranges:  Prepandial:   less than 140 mg/dL      Peak postprandial:   less than 180 mg/dL (1-2 hours)      Critically ill patients:  140 - 180 mg/dL   Lab Results  Component Value Date   GLUCAP 317 (H) 02/06/2017   HGBA1C 14.5 (H) 12/13/2016     Diabetes history: DM, DM wound infection Outpatient Diabetes medications:  None ( stated "does not take at home", previously refusing to take "until he gets a PCP" on 12/13/2016 when this RN last spoke with patient at bedside) Current orders for Inpatient glycemic control: Carb Mod Diet Lantus 7 units QHS Novolog 0-9 units Q4H  Inpatient Diabetes Program Recommendations:    Please consider meal coverage of Novolog 3 units TIDAC if patient eats > 50% of meal.    Noted patient will be NPO after midnight.  Please consider giving Lantus 5 units this hs when NPO.  Thank you,  Kristine LineaKaren Revel Stellmach, RN, MSN Diabetes Coordinator Inpatient Diabetes Program [phone] (Team Pager)

## 2017-02-06 NOTE — Progress Notes (Addendum)
Family Medicine Teaching Service Daily Progress Note Intern Pager: [phone]  Patient name: Derrick [name] Medical record number: [id number] Date of birth: 12/26/1970 Age: 46 y.o. Gender: male  Primary Care Provider: Pcp Not In System Consultants: Orthopedics Code Status: DNR  Pt Overview and Major Events to Date:  2/18 admit to FPTS 2/19 Plan for surgical I&D of right AKA by Ortho  Assessment and Plan: Derrick [name] is a 46 y.o. male presenting with right AKA pain, bleeding, and possible infection. PMH is significant for poorly controlled DM, smoker.   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 Underwent I&D yesterday by ortho with wound vac placed, repeat culture sent. -  Continue ceftriaxone - blood cultures NG <24h, f/u wound cultures - Will f/u ortho recs - plan for repeat irrigation and debridement tomorrow and possible hip disarticulation if needed - NPO at midnight - pain control with morphine 4 mg q2H PRN and oxycodone 5-10 mg Q3H PRN - if spikes fever s/p debridement consider calling ID for linezolid for MRSA coverage  DM2, uncontrolled A1c 14.5 on 11/2017. Does not take his home insulin. CBG 211, 317 overnight.  Used 12u Aspart yesterday. - Lantus 7 units - Sensitive SSI >> moderate sliding scale - CBG q4H while NPO for surgery  Elevated Platelets, improved. (670 from 1001), possibly due to recent surgery and infection (acute phase reactant) - monitor   Tobacco Use: 2ppd - declined Nicotine patch   FEN/GI: carb modified, NPO at midnight for procedure today Prophylaxis: holding for surgery  Disposition: Home pending clinical improvement  Subjective:  Patient doing well this morning. No fevers. No acute events overnight. He endorses using incentive spirometer at bedside. States his pain is well controlled.  Objective: Temp:  [97.9 F (36.6 C)-99 F (37.2 C)] 99 F (37.2 C) (02/20 0545) Pulse Rate:  [99-108] 99 (02/20  0545) Resp:  [16-20] 17 (02/20 0545) BP: (93-128)/(48-74) 98/48 (02/20 0545) SpO2:  [98 %-100 %] 100 % (02/20 0545) Physical Exam: General: NAD, rests comfortably in bed Cardiovascular: RRR, no m/r/g Respiratory: CTA bil no W/R/R Abdomen: soft, nt nd, normoactive BS Extremities: Left BKA. Right AKA with wound vac in place, draining.  Laboratory:  Recent Labs Lab 02/04/17 1753 02/06/17 0531  WBC 41.4* 40.8*  HGB 11.4* 7.2*  HCT 32.9* 22.1*  PLT 1,001* 670*    Recent Labs Lab 02/04/17 1753 02/05/17 0752 02/06/17 0531  NA 131* 133* 130*  K 3.7 3.0* 3.0*  CL 89* 96* 94*  CO2 23 27 26   BUN 10 9 9   CREATININE 0.85 0.65 0.71  CALCIUM 8.8* 8.1* 7.8*  GLUCOSE 370* 158* 295*    Imaging/Diagnostic Tests: No results found.   Howard PouchLauren Neven Fina, MD 02/06/2017, 9:08 AM PGY-1, [facility] Family Medicine FPTS Intern pager: [phone], text pages welcome

## 2017-02-07 ENCOUNTER — Encounter (HOSPITAL_COMMUNITY): Payer: Self-pay | Admitting: Surgery

## 2017-02-07 ENCOUNTER — Inpatient Hospital Stay (HOSPITAL_COMMUNITY): Payer: Medicare Other | Admitting: Certified Registered"

## 2017-02-07 ENCOUNTER — Encounter (HOSPITAL_COMMUNITY)
Admission: EM | Disposition: A | Discharge status: Home Health | Payer: Self-pay | Source: Home / Self Care | Attending: Family Medicine

## 2017-02-07 DIAGNOSIS — T8740 Infection of amputation stump, unspecified extremity: Secondary | ICD-10-CM

## 2017-02-07 HISTORY — PX: STUMP REVISION: SHX6102

## 2017-02-07 LAB — CBC
HEMATOCRIT: 21.5 % — AB (ref 39.0–52.0)
HEMOGLOBIN: 7 g/dL — AB (ref 13.0–17.0)
MCH: 24.9 pg — AB (ref 26.0–34.0)
MCHC: 32.6 g/dL (ref 30.0–36.0)
MCV: 76.5 fL — ABNORMAL LOW (ref 78.0–100.0)
Platelets: 638 10*3/uL — ABNORMAL HIGH (ref 150–400)
RBC: 2.81 MIL/uL — ABNORMAL LOW (ref 4.22–5.81)
RDW: 15.9 % — ABNORMAL HIGH (ref 11.5–15.5)
WBC: 30.6 10*3/uL — ABNORMAL HIGH (ref 4.0–10.5)

## 2017-02-07 LAB — BASIC METABOLIC PANEL
Anion gap: 10 (ref 5–15)
BUN: 6 mg/dL (ref 6–20)
CHLORIDE: 92 mmol/L — AB (ref 101–111)
CO2: 27 mmol/L (ref 22–32)
Calcium: 7.5 mg/dL — ABNORMAL LOW (ref 8.9–10.3)
Creatinine, Ser: 0.7 mg/dL (ref 0.61–1.24)
GFR calc Af Amer: >60 mL/min (ref >60)
GFR calc non Af Amer: >60 mL/min (ref >60)
Glucose, Bld: 240 mg/dL — ABNORMAL HIGH (ref 65–99)
POTASSIUM: 2.8 mmol/L — AB (ref 3.5–5.1)
SODIUM: 129 mmol/L — AB (ref 135–145)

## 2017-02-07 LAB — PREPARE RBC (CROSSMATCH)

## 2017-02-07 LAB — GLUCOSE, CAPILLARY
GLUCOSE-CAPILLARY: 278 mg/dL — AB (ref 65–99)
GLUCOSE-CAPILLARY: 367 mg/dL — AB (ref 65–99)
Glucose-Capillary: 244 mg/dL — ABNORMAL HIGH (ref 65–99)
Glucose-Capillary: 243 mg/dL — ABNORMAL HIGH (ref 65–99)
Glucose-Capillary: 158 mg/dL — ABNORMAL HIGH (ref 65–99)
Glucose-Capillary: 243 mg/dL — ABNORMAL HIGH (ref 65–99)
Glucose-Capillary: 262 mg/dL — ABNORMAL HIGH (ref 65–99)

## 2017-02-07 SURGERY — REVISION, AMPUTATION SITE
Anesthesia: General | Laterality: Right

## 2017-02-07 SURGERY — REVISION, AMPUTATION SITE
Anesthesia: General | Site: Leg Upper | Laterality: Right

## 2017-02-07 MED ORDER — MIDAZOLAM HCL 5 MG/5ML IJ SOLN
INTRAMUSCULAR | Status: DC | PRN
  Administered 2017-02-07: 2 mg via INTRAVENOUS

## 2017-02-07 MED ORDER — POTASSIUM CHLORIDE CRYS ER 20 MEQ PO TBCR
40.0000 meq | EXTENDED_RELEASE_TABLET | Freq: Once | ORAL | Status: AC
  Administered 2017-02-07: 40 meq via ORAL
  Filled 2017-02-07: qty 2

## 2017-02-07 MED ORDER — ONDANSETRON HCL 4 MG/2ML IJ SOLN
INTRAMUSCULAR | Status: DC | PRN
  Administered 2017-02-07: 4 mg via INTRAVENOUS

## 2017-02-07 MED ORDER — OXYCODONE HCL 5 MG PO TABS: 5.0000 mg | ORAL_TABLET | Freq: Once | ORAL | Status: DC | PRN

## 2017-02-07 MED ORDER — ONDANSETRON HCL 4 MG/2ML IJ SOLN: 4.0000 mg | Freq: Four times a day (QID) | INTRAMUSCULAR | Status: DC | PRN

## 2017-02-07 MED ORDER — PHENYLEPHRINE HCL 10 MG/ML IJ SOLN
INTRAMUSCULAR | Status: DC | PRN
  Administered 2017-02-07: 80 ug via INTRAVENOUS
  Administered 2017-02-07: 120 ug via INTRAVENOUS
  Administered 2017-02-07: 160 ug via INTRAVENOUS
  Administered 2017-02-07: 80 ug via INTRAVENOUS
  Administered 2017-02-07: 40 ug via INTRAVENOUS

## 2017-02-07 MED ORDER — CEFAZOLIN SODIUM-DEXTROSE 2-4 GM/100ML-% IV SOLN
2.0000 g | INTRAVENOUS | Status: AC
  Administered 2017-02-07: 2 g via INTRAVENOUS
  Filled 2017-02-07: qty 100

## 2017-02-07 MED ORDER — OXYCODONE HCL 5 MG/5ML PO SOLN: 5.0000 mg | Freq: Once | ORAL | Status: DC | PRN

## 2017-02-07 MED ORDER — MIDAZOLAM HCL 2 MG/2ML IJ SOLN
INTRAMUSCULAR | Status: AC
  Filled 2017-02-07: qty 2

## 2017-02-07 MED ORDER — ONDANSETRON HCL 4 MG/2ML IJ SOLN
INTRAMUSCULAR | Status: AC
  Filled 2017-02-07: qty 4

## 2017-02-07 MED ORDER — POTASSIUM CHLORIDE CRYS ER 20 MEQ PO TBCR
40.0000 meq | EXTENDED_RELEASE_TABLET | Freq: Two times a day (BID) | ORAL | Status: AC
  Administered 2017-02-07: 40 meq via ORAL
  Filled 2017-02-07: qty 2

## 2017-02-07 MED ORDER — TRANEXAMIC ACID 1000 MG/10ML IV SOLN
2000.0000 mg | INTRAVENOUS | Status: AC
  Administered 2017-02-07: 2000 mg via TOPICAL
  Filled 2017-02-07: qty 20

## 2017-02-07 MED ORDER — DOCUSATE SODIUM 100 MG PO CAPS
100.0000 mg | ORAL_CAPSULE | Freq: Two times a day (BID) | ORAL | Status: DC
  Administered 2017-02-09 – 2017-02-11 (×4): 100 mg via ORAL
  Filled 2017-02-07 (×8): qty 1

## 2017-02-07 MED ORDER — METOCLOPRAMIDE HCL 5 MG/ML IJ SOLN: 5.0000 mg | Freq: Three times a day (TID) | INTRAMUSCULAR | Status: DC | PRN

## 2017-02-07 MED ORDER — SODIUM CHLORIDE 0.9 % IV SOLN
INTRAVENOUS | Status: DC | PRN
  Administered 2017-02-07: 13:00:00 via INTRAVENOUS

## 2017-02-07 MED ORDER — POLYETHYLENE GLYCOL 3350 17 G PO PACK: 17.0000 g | PACK | Freq: Every day | ORAL | Status: DC | PRN

## 2017-02-07 MED ORDER — PHENYLEPHRINE HCL 10 MG/ML IJ SOLN
INTRAMUSCULAR | Status: DC | PRN
  Administered 2017-02-07: 60 ug/min via INTRAVENOUS

## 2017-02-07 MED ORDER — SODIUM CHLORIDE 0.9 % IV SOLN
2000.0000 mg | INTRAVENOUS | Status: AC
  Administered 2017-02-07: 2000 mg via TOPICAL
  Filled 2017-02-07: qty 20

## 2017-02-07 MED ORDER — POTASSIUM CHLORIDE CRYS ER 10 MEQ PO TBCR
EXTENDED_RELEASE_TABLET | ORAL | Status: AC
  Filled 2017-02-07: qty 4

## 2017-02-07 MED ORDER — BISACODYL 10 MG RE SUPP: 10.0000 mg | Freq: Every day | RECTAL | Status: DC | PRN

## 2017-02-07 MED ORDER — METHOCARBAMOL 500 MG PO TABS: 500.0000 mg | ORAL_TABLET | Freq: Four times a day (QID) | ORAL | Status: DC | PRN

## 2017-02-07 MED ORDER — FENTANYL CITRATE (PF) 100 MCG/2ML IJ SOLN
INTRAMUSCULAR | Status: DC | PRN
  Administered 2017-02-07: 100 ug via INTRAVENOUS

## 2017-02-07 MED ORDER — CHLORHEXIDINE GLUCONATE 4 % EX LIQD: 60.0000 mL | Freq: Once | CUTANEOUS | Status: DC

## 2017-02-07 MED ORDER — PROPOFOL 10 MG/ML IV BOLUS
INTRAVENOUS | Status: DC | PRN
  Administered 2017-02-07: 200 mg via INTRAVENOUS

## 2017-02-07 MED ORDER — HYDROMORPHONE HCL 2 MG/ML IJ SOLN: 0.2500 mg | INTRAMUSCULAR | Status: DC | PRN

## 2017-02-07 MED ORDER — FENTANYL CITRATE (PF) 100 MCG/2ML IJ SOLN
INTRAMUSCULAR | Status: AC
  Filled 2017-02-07: qty 2

## 2017-02-07 MED ORDER — SODIUM CHLORIDE 0.9 % IV SOLN: Freq: Once | INTRAVENOUS | Status: DC

## 2017-02-07 MED ORDER — LIDOCAINE 2% (20 MG/ML) 5 ML SYRINGE
INTRAMUSCULAR | Status: AC
  Filled 2017-02-07: qty 5

## 2017-02-07 MED ORDER — MAGNESIUM CITRATE PO SOLN: 1.0000 | Freq: Once | ORAL | Status: DC | PRN

## 2017-02-07 MED ORDER — LIDOCAINE 2% (20 MG/ML) 5 ML SYRINGE
INTRAMUSCULAR | Status: DC | PRN
  Administered 2017-02-07: 60 mg via INTRAVENOUS

## 2017-02-07 MED ORDER — PROPOFOL 10 MG/ML IV BOLUS
INTRAVENOUS | Status: AC
  Filled 2017-02-07: qty 20

## 2017-02-07 MED ORDER — 0.9 % SODIUM CHLORIDE (POUR BTL) OPTIME
TOPICAL | Status: DC | PRN
  Administered 2017-02-07 (×2): 1000 mL

## 2017-02-07 MED ORDER — ONDANSETRON HCL 4 MG PO TABS: 4.0000 mg | ORAL_TABLET | Freq: Four times a day (QID) | ORAL | Status: DC | PRN

## 2017-02-07 MED ORDER — ONDANSETRON HCL 4 MG/2ML IJ SOLN
INTRAMUSCULAR | Status: AC
  Filled 2017-02-07: qty 2

## 2017-02-07 MED ORDER — METOCLOPRAMIDE HCL 5 MG PO TABS: 5.0000 mg | ORAL_TABLET | Freq: Three times a day (TID) | ORAL | Status: DC | PRN

## 2017-02-07 MED ORDER — PHENYLEPHRINE HCL 10 MG/ML IJ SOLN
INTRAMUSCULAR | Status: AC
  Filled 2017-02-07: qty 1

## 2017-02-07 MED ORDER — METHOCARBAMOL 1000 MG/10ML IJ SOLN: 500.0000 mg | Freq: Four times a day (QID) | INTRAVENOUS | Status: DC | PRN

## 2017-02-07 SURGICAL SUPPLY — 38 items
APL SKNCLS STERI-STRIP NONHPOA (GAUZE/BANDAGES/DRESSINGS) ×1
BAG DECANTER FOR FLEXI CONT (MISCELLANEOUS) ×4 IMPLANT
BENZOIN TINCTURE PRP APPL 2/3 (GAUZE/BANDAGES/DRESSINGS) ×2 IMPLANT
BLADE SAW RECIP 87.9 MT (BLADE) ×2 IMPLANT
BLADE SURG 21 STRL SS (BLADE) ×7 IMPLANT
CANISTER WOUND CARE 500ML ATS (WOUND CARE) ×2 IMPLANT
COVER SURGICAL LIGHT HANDLE (MISCELLANEOUS) ×3 IMPLANT
DRAPE EXTREMITY T 121X128X90 (DRAPE) ×3 IMPLANT
DRAPE INCISE IOBAN 66X45 STRL (DRAPES) ×5 IMPLANT
DRAPE ORTHO SPLIT 77X108 STRL (DRAPES) ×6
DRAPE PROXIMA HALF (DRAPES) ×3 IMPLANT
DRAPE SURG ORHT 6 SPLT 77X108 (DRAPES) IMPLANT
DRAPE U-SHAPE 47X51 STRL (DRAPES) ×6 IMPLANT
DRSG VAC ATS MED SENSATRAC (GAUZE/BANDAGES/DRESSINGS) ×3 IMPLANT
DURAPREP 26ML APPLICATOR (WOUND CARE) ×3 IMPLANT
ELECT REM PT RETURN 9FT ADLT (ELECTROSURGICAL) ×3
ELECTRODE REM PT RTRN 9FT ADLT (ELECTROSURGICAL) ×1 IMPLANT
GLOVE BIO SURGEON STRL SZ 6.5 (GLOVE) ×3 IMPLANT
GLOVE BIO SURGEONS STRL SZ 6.5 (GLOVE) ×3
GLOVE BIOGEL PI IND STRL 9 (GLOVE) ×1 IMPLANT
GLOVE BIOGEL PI INDICATOR 9 (GLOVE) ×2
GLOVE SURG ORTHO 9.0 STRL STRW (GLOVE) ×5 IMPLANT
GOWN STRL REUS W/ TWL XL LVL3 (GOWN DISPOSABLE) ×2 IMPLANT
GOWN STRL REUS W/TWL XL LVL3 (GOWN DISPOSABLE) ×6
KIT BASIN OR (CUSTOM PROCEDURE TRAY) ×3 IMPLANT
KIT ROOM TURNOVER OR (KITS) ×3 IMPLANT
MANIFOLD NEPTUNE II (INSTRUMENTS) ×3 IMPLANT
NS IRRIG 1000ML POUR BTL (IV SOLUTION) ×5 IMPLANT
PACK GENERAL/GYN (CUSTOM PROCEDURE TRAY) ×3 IMPLANT
PAD ARMBOARD 7.5X6 YLW CONV (MISCELLANEOUS) ×3 IMPLANT
PAD NEG PRESSURE SENSATRAC (MISCELLANEOUS) IMPLANT
SPONGE LAP 18X18 X RAY DECT (DISPOSABLE) ×6 IMPLANT
STAPLER VISISTAT 35W (STAPLE) IMPLANT
SUT ETHILON 2 0 PSLX (SUTURE) ×10 IMPLANT
SUT SILK 2 0 (SUTURE) ×6
SUT SILK 2-0 18XBRD TIE 12 (SUTURE) IMPLANT
TOWEL GREEN STERILE FF (TOWEL DISPOSABLE) ×2 IMPLANT
TOWEL OR 17X26 10 PK STRL BLUE (TOWEL DISPOSABLE) ×3 IMPLANT

## 2017-02-07 NOTE — Anesthesia Procedure Notes (Signed)
Procedure Name: LMA Insertion Date/Time: 02/07/2017 11:51 AM Performed by: Charm BargesBUTLER, Colbert Curenton R Pre-anesthesia Checklist: Patient identified, Emergency Drugs available, Suction available and Patient being monitored Patient Re-evaluated:Patient Re-evaluated prior to inductionOxygen Delivery Method: Circle System Utilized Preoxygenation: Pre-oxygenation with 100% oxygen Intubation Type: IV induction LMA: LMA inserted LMA Size: 5.0 Number of attempts: 1 Placement Confirmation: positive ETCO2 Tube secured with: Tape Dental Injury: Teeth and Oropharynx as per pre-operative assessment

## 2017-02-07 NOTE — Care Management Note (Signed)
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 Underwent I&D 2/19, returning to OR 02/07/17 for repeat I & D.      Action/Plan: Case manager spoke with patient and his sister concerning discharge plan and DME needs. Patient states he will need a wheelchair. Per his sister He will be moving in [place], but for now will return to his [place] in [place]. He will have family support at discharge. Choice was offered for Home Health, referral was called to Sarah at [facility].  Patient and sister were appreciative of CM discussing their needs.   Expected Discharge Date:   pending               Expected Discharge Plan:  Home w Home Health Services  In-House Referral:  NA  Discharge planning Services  CM Consult  Post Acute Care Choice:  Durable Medical Equipment, Home Health Choice offered to:  Patient, Sibling  DME Arranged:  Wheelchair manual DME Agency:  [facility]  HH Arr[facility]  [facility]  [facility]  Status of Service:  In process, will continue to follow  If discussed at Long Length of Stay Meetings, dates discussed:    Additional Comments:  Durenda GuthrieBrady, Jacque Byron Naomi, RN 02/07/2017, 10:25 AM

## 2017-02-07 NOTE — Transfer of Care (Signed)
Immediate Anesthesia Transfer of Care Note  Patient: Joette CatchingBobby L Galvao  Procedure(s) Performed: Procedure(s): RIGHT HIP DISARTICULATION (Right)  Patient Location: PACU  Anesthesia Type:General  Level of Consciousness: sedated  Airway & Oxygen Therapy: Patient Spontanous Breathing and Patient connected to nasal cannula oxygen  Post-op Assessment: Report given to RN, Post -op Vital signs reviewed and stable and Patient moving all extremities  Post vital signs: Reviewed and stable  Last Vitals:  Vitals:   02/07/17 0503 02/07/17 1315  BP: 123/69 132/75  Pulse: (!) 101 (!) 102  Resp: 19 (!) 8  Temp: 36.9 C 36.9 C    Last Pain:  Vitals:   02/07/17 0643  TempSrc:   PainSc: 9       Patients Stated Pain Goal: 0 (02/07/17 [id number])  Complications: No apparent anesthesia complications

## 2017-02-07 NOTE — Progress Notes (Signed)
Responded to consult to create advanced directive (AD). Provided spiritual/emotional support and prayer to pt and several family members in rm. Explained daytime process to execute AD. Pt had received form from nurse yesterday afternoon and he'd discussed completing an AD w/ some family members. Pt asked that we return tomorrow. Since pt didn't wish then to discuss form options substantively, am unsure if pt will need explanations of any on Weds.  before being ready to call notary. Chaplain available for f/u.   02/06/17 2100  Clinical Encounter Type  Visited With Patient and family together  Visit Type Initial;Psychological support;Spiritual support;Social support;Post-op  Referral From Nurse  Spiritual Encounters  Spiritual Needs Brochure;Prayer;Emotional  Stress Factors  Patient Stress Factors Health changes;Loss of control  Family Stress Factors Family relationships;Health changes;Loss of control   Ephraim Hamburgerynthia A Jakaya Jacobowitz, [address]

## 2017-02-07 NOTE — Op Note (Signed)
02/04/2017 - 02/07/2017  1:14 PM  PATIENT:  Derrick [name]    PRE-OPERATIVE DIAGNOSIS:  Abscess Right Above Knee Amputation  POST-OPERATIVE DIAGNOSIS:  Same  PROCEDURE:  RIGHT HIP DISARTICULATION  SURGEON:  Nadara MustardMarcus V Elizebath Wever, MD  PHYSICIAN ASSISTANT:None ANESTHESIA:   General  PREOPERATIVE INDICATIONS:  Derrick RoseBobby L Wiederholt is a  46 y.o. male with a diagnosis of Abscess Right Above Knee Amputation who failed conservative measures and elected for surgical management.    The risks benefits and alternatives were discussed with the patient preoperatively including but not limited to the risks of infection, bleeding, nerve injury, cardiopulmonary complications, the need for revision surgery, among others, and the patient was willing to proceed.  OPERATIVE IMPLANTS: TXA 2 g topically  OPERATIVE FINDINGS: Abscess extended up to his ischial tuberosity.  OPERATIVE PROCEDURE: Patient brought the operating room and underwent a general anesthesia. After adequate levels anesthesia obtained patient's right lower extremity was prepped using DuraPrep draped into a sterile field. The sutures were removed examination patient had no infection superficially however with deep dissection there is a large purulent abscess was extended up to the ischial tuberosity. This time to determine the patient did not have a limb salvage above-the-knee amputation options and procedure was progressed for a hip disarticulation. A teardrop incision was made centered over the iliac crest. The femoral vessels were clamped dissection was carried down through the muscles the hip was disarticulated through the acetabulum. Patient did not have any hamstring muscles that were nonviable and these were all resected. The deep fascial layers were also resected these also showed signs of infection. The hip was irrigated with normal saline electrocautery and 2-0 silk was used for hemostasis. The wound was soaked with 2 g TXA. Local tissue  rearrangement was used for wound closure with 2-0 nylon. A wound VAC was applied this had a good suction fit patient was extubated taken the PACU in stable condition.

## 2017-02-07 NOTE — Progress Notes (Signed)
Family Medicine Teaching Service Daily Progress Note Intern Pager: [phone]  Patient name: Derrick [name] Medical record number: [id number] Date of birth: 10/25/1971 Age: 46 y.o. Gender: male  Primary Care Provider: Pcp Not In System Consultants: Orthopedics Code Status: DNR  Pt Overview and Major Events to Date:  2/18 admit to FPTS 2/19 Plan for surgical I&D of right AKA by Ortho  Assessment and Plan: Derrick [name] is a 46 y.o. male presenting with right AKA pain, bleeding, and possible infection. PMH is significant for poorly controlled DM, smoker.   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 Underwent I&D 2/19  by ortho with wound vac placed, repeat culture sent. Plan for AKA revision in OR today by ortho. Gram stain 2/19 show G- rods, G+ cocci in pairs; culture pending. BCx NG x2D.  - Continue ceftriaxone - Continue following cultures - Will f/u ortho recs after procedure today - pain control with morphine 4 mg q2H PRN and oxycodone 5-10 mg Q3H PRN - if spikes fever s/p debridement consider calling ID for linezolid for MRSA coverage  DM2, uncontrolled A1c 14.5 on 11/2017. Does not take his home insulin. CBG 278, 244 overnight.  Used 11u Aspart yesterday. - Lantus 7 units; can restart and titrate up after tolerating a diet - moderate sliding scale - CBG q4H while NPO for surgery  Anemia - Hgb 7.0 this AM, likely in setting of recent surgical procedure.  Patient refusing 1 PRBC this AM despite long discussion.  Patient prefers to defer to orthopedics. - monitor CBC  Elevated Platelets, improved. (638 from 1001), possibly due to recent surgery and infection (acute phase reactant) - monitor   Hypokalemia - low today at 2.8 - 40 mEq KDUR x 2 - recheck AM BMP  Tobacco Use: 2ppd - declined Nicotine patch   FEN/GI: carb modified, NPO at midnight for procedure today Prophylaxis: holding for surgery  Disposition: Home with home health  pending clinical improvement  Subjective:  Afebrile overnight. No acute events.  Patient refusing blood transfusion this AM. Feels his pain is well-controlled.  Objective: Temp:  [98.2 F (36.8 C)-99 F (37.2 C)] 98.5 F (36.9 C) (02/21 0503) Pulse Rate:  [95-105] 101 (02/21 0503) Resp:  [17-19] 19 (02/21 0503) BP: (107-123)/(61-71) 123/69 (02/21 0503) SpO2:  [97 %-100 %] 97 % (02/21 0503) Physical Exam: General: NAD, rests comfortably in bed Cardiovascular: RRR, no m/r/g Respiratory: CTA bil no W/R/R Abdomen: soft, nt nd, normoactive BS Extremities: Left BKA. Right AKA with wound vac in place, draining  Laboratory:  Recent Labs Lab 02/04/17 1753 02/06/17 0531 02/07/17 0409  WBC 41.4* 40.8* 30.6*  HGB 11.4* 7.2* 7.0*  HCT 32.9* 22.1* 21.5*  PLT 1,001* 670* 638*    Recent Labs Lab 02/05/17 0752 02/06/17 0531 02/07/17 0409  NA 133* 130* 129*  K 3.0* 3.0* 2.8*  CL 96* 94* 92*  CO2 27 26 27   BUN 9 9 6   CREATININE 0.65 0.71 0.70  CALCIUM 8.1* 7.8* 7.5*  GLUCOSE 158* 295* 240*   Gram stain 2/19 show G- rods, G+ cocci in pairs; culture pending    Imaging/Diagnostic Tests: No results found.  Howard PouchLauren Jesusa Stenerson, MD 02/07/2017, 8:54 AM PGY-1, [facility] Family Medicine FPTS Intern pager: [phone], text pages welcome

## 2017-02-07 NOTE — H&P (View-Only) (Signed)
Patient ID: Derrick [name], male   DOB: 07/18/1971, 45 y.o.   MRN: [id number] Postoperative day 1 revision right above-knee amputation. Patient had extensive infection involving the hamstring muscles. Repeat cultures were obtained. Hamstring muscles were resected. The wound VAC is functioning well. Blood work is pending. Plan for repeat irrigation and debridement tomorrow. I discussed with the patient if there is persistent infection we would need to proceed with a hip disarticulation. 

## 2017-02-07 NOTE — Interval H&P Note (Signed)
History and Physical Interval Note:  02/07/2017 6:27 AM  Derrick RoseBobby L Mordecai  has presented today for surgery, with the diagnosis of Abscess Right Above Knee Amputation  The various methods of treatment have been discussed with the patient and family. After consideration of risks, benefits and other options for treatment, the patient has consented to  Procedure(s): Repeat Revision Right Above Knee Amputation (Right) as a surgical intervention .  The patient's history has been reviewed, patient examined, no change in status, stable for surgery.  I have reviewed the patient's chart and labs.  Questions were answered to the patient's satisfaction.     Nadara MustardMarcus V Duda

## 2017-02-07 NOTE — Anesthesia Preprocedure Evaluation (Signed)
Anesthesia Evaluation  Patient identified by MRN, date of birth, ID band Patient awake    Reviewed: Allergy & Precautions, NPO status , Patient's Chart, lab work & pertinent test results  Airway Mallampati: II  TM Distance: >3 FB Neck ROM: Full    Dental  (+) Partial Lower, Poor Dentition   Pulmonary pneumonia, resolved, Current Smoker,    Pulmonary exam normal breath sounds clear to auscultation       Cardiovascular hypertension, Pt. on medications Normal cardiovascular exam Rhythm:Regular Rate:Normal     Neuro/Psych Diabetic neuropathy  Neuromuscular disease    GI/Hepatic GERD  Medicated and Controlled,  Endo/Other  diabetes, Poorly Controlled, Type 2, Insulin Dependent, Oral Hypoglycemic AgentsNon compliant with Insulin  Renal/GU    Hx/o Prostatitis    Musculoskeletal  (+) Arthritis , Osteoarthritis,  Infected right BKA with wound dehiscence   Abdominal   Peds  Hematology  (+) anemia , Sepsis   Anesthesia Other Findings   Reproductive/Obstetrics                             Anesthesia Physical Anesthesia Plan  ASA: III  Anesthesia Plan: General   Post-op Pain Management:    Induction: Intravenous  Airway Management Planned: LMA and Oral ETT  Additional Equipment: None  Intra-op Plan:   Post-operative Plan: Extubation in OR  Informed Consent: I have reviewed the patients History and Physical, chart, labs and discussed the procedure including the risks, benefits and alternatives for the proposed anesthesia with the patient or authorized representative who has indicated his/her understanding and acceptance.   Dental advisory given  Plan Discussed with: CRNA and Surgeon  Anesthesia Plan Comments:         Anesthesia Quick Evaluation

## 2017-02-07 NOTE — Plan of Care (Signed)
Problem: Safety: Goal: Ability to remain free from injury will improve Outcome: Progressing Safety pracutions maintained  Problem: Physical Regulation: Goal: Will remain free from infection Outcome: Progressing No s/S of infection noted, VS WNL  Problem: Physical Regulation: Goal: Postoperative complications will be avoided or minimized Outcome: Progressing No complications noted  Problem: Self-Concept: Goal: Verbalizations of decreased anxiety will increase Outcome: Progressing No anxiety noted

## 2017-02-08 ENCOUNTER — Encounter (HOSPITAL_COMMUNITY): Payer: Self-pay | Admitting: Orthopedic Surgery

## 2017-02-08 ENCOUNTER — Ambulatory Visit (INDEPENDENT_AMBULATORY_CARE_PROVIDER_SITE_OTHER): Payer: Medicare Other | Admitting: Orthopedic Surgery

## 2017-02-08 LAB — TYPE AND SCREEN
ABO/RH(D): A POS
ANTIBODY SCREEN: NEGATIVE
UNIT DIVISION: 0
UNIT DIVISION: 0
UNIT DIVISION: 0
Unit division: 0

## 2017-02-08 LAB — CBC
HEMATOCRIT: 27.8 % — AB (ref 39.0–52.0)
HEMOGLOBIN: 9.6 g/dL — AB (ref 13.0–17.0)
MCH: 27.8 pg (ref 26.0–34.0)
MCHC: 34.5 g/dL (ref 30.0–36.0)
MCV: 80.6 fL (ref 78.0–100.0)
Platelets: 529 10*3/uL — ABNORMAL HIGH (ref 150–400)
RBC: 3.45 MIL/uL — AB (ref 4.22–5.81)
RDW: 15.7 % — ABNORMAL HIGH (ref 11.5–15.5)
WBC: 25 10*3/uL — AB (ref 4.0–10.5)

## 2017-02-08 LAB — GLUCOSE, CAPILLARY
GLUCOSE-CAPILLARY: 179 mg/dL — AB (ref 65–99)
GLUCOSE-CAPILLARY: 294 mg/dL — AB (ref 65–99)
GLUCOSE-CAPILLARY: 260 mg/dL — AB (ref 65–99)
Glucose-Capillary: 201 mg/dL — ABNORMAL HIGH (ref 65–99)
Glucose-Capillary: 390 mg/dL — ABNORMAL HIGH (ref 65–99)

## 2017-02-08 LAB — BASIC METABOLIC PANEL
ANION GAP: 10 (ref 5–15)
BUN: 5 mg/dL — ABNORMAL LOW (ref 6–20)
CO2: 25 mmol/L (ref 22–32)
Calcium: 7.5 mg/dL — ABNORMAL LOW (ref 8.9–10.3)
Chloride: 94 mmol/L — ABNORMAL LOW (ref 101–111)
Creatinine, Ser: 0.51 mg/dL — ABNORMAL LOW (ref 0.61–1.24)
GLUCOSE: 169 mg/dL — AB (ref 65–99)
POTASSIUM: 3.4 mmol/L — AB (ref 3.5–5.1)
Sodium: 129 mmol/L — ABNORMAL LOW (ref 135–145)

## 2017-02-08 MED ORDER — INSULIN ASPART 100 UNIT/ML ~~LOC~~ SOLN
10.0000 [IU] | Freq: Once | SUBCUTANEOUS | Status: AC
  Administered 2017-02-08: 10 [IU] via SUBCUTANEOUS

## 2017-02-08 MED ORDER — OXYCODONE-ACETAMINOPHEN 7.5-325 MG PO TABS
1.0000 | ORAL_TABLET | ORAL | Status: DC
  Administered 2017-02-08 – 2017-02-11 (×19): 1 via ORAL
  Filled 2017-02-08 (×19): qty 1

## 2017-02-08 MED ORDER — IBUPROFEN 100 MG/5ML PO SUSP
600.0000 mg | Freq: Four times a day (QID) | ORAL | Status: DC | PRN
  Filled 2017-02-08: qty 30

## 2017-02-08 MED ORDER — POTASSIUM CHLORIDE CRYS ER 20 MEQ PO TBCR
40.0000 meq | EXTENDED_RELEASE_TABLET | Freq: Once | ORAL | Status: AC
  Administered 2017-02-08: 40 meq via ORAL
  Filled 2017-02-08: qty 2

## 2017-02-08 MED ORDER — INSULIN GLARGINE 100 UNIT/ML ~~LOC~~ SOLN
10.0000 [IU] | Freq: Every day | SUBCUTANEOUS | Status: DC
  Administered 2017-02-08 – 2017-02-09 (×2): 10 [IU] via SUBCUTANEOUS
  Filled 2017-02-08 (×2): qty 0.1

## 2017-02-08 MED ORDER — GABAPENTIN 300 MG PO CAPS
300.0000 mg | ORAL_CAPSULE | Freq: Three times a day (TID) | ORAL | Status: DC
  Administered 2017-02-08 – 2017-02-10 (×7): 300 mg via ORAL
  Filled 2017-02-08 (×7): qty 1

## 2017-02-08 NOTE — Progress Notes (Signed)
Family Medicine Teaching Service Daily Progress Note Intern Pager: [phone]  Patient name: Derrick [name] Medical record number: [id number] Date of birth: 1971/02/20 Age: 46 y.o. Gender: male  Primary Care Provider: Pcp Not In System Consultants: Orthopedics Code Status: DNR  Pt Overview and Major Events to Date:  2/18 admit to FPTS 2/19 Plan for surgical I&D of right AKA by Ortho 2/21 R hip disarticulation  Assessment and Plan: Derrick [name] is a 46 y.o. male presenting with right AKA pain, bleeding, and possible infection. PMH is significant for poorly controlled DM, smoker.   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 S/p I&D 2/19  by ortho with wound vac placed, repeat culture shows GBS (S agalactiae) sensitive to ceftriaxone. Now day #1 s/p R hip disarticulation (2/21). BCx NGTD.   - Continue ceftriaxone, consider transition to PO ABx - Continue following cultures - Will f/u ortho recs - plan for DC Sunday. Recommend continue IV antibiotics with discharge Sunday morning with discharge on Augmentin for 4 weeks.  - pain control with percocet 7.5-325 q4H, ibuprofen 600 mg q6H PRN  DM2, uncontrolled A1c 14.5 on 11/2017. Does not take his home insulin. CBG 390,179 overnight. Refused sliding scale >10u so 10u novolog given overnight. Used 20u Aspart total yesterday. - Lantus 7 units>> titrate up to 10u Lantus today - moderate sliding scale  Anemia, improved - unclear etiology, may be iron deficiency anemia, unable to assess with apparent transfusion yesterday. Hgb stable at 9.6 today - continue to monitor  Elevated Platelets, improved. (529 from 1001), possibly due to recent surgery and infection (acute phase reactant) vs iron deficiency anemia - monitor   Hypokalemia, improved - Improved. K 3.4 today. KDUR x1 - recheck AM BMP  Hyponatremia, worsening  - gradually trending down 133 >>>129  Tobacco Use:  2ppd - declined Nicotine patch   FEN/GI: carb modified, NPO at midnight for procedure today Prophylaxis: holding for surgery  Disposition: Home with home health pending clinical improvement  Subjective:  Overnight patient refused to take any more than 10 units of NovoLog. 10 units were given for hyperglycemia. He states this morning that he requires morphine IV to "stay relaxed." Discussed the necessity of dialing back to by mouth pain control prior to discharge. He voices understanding. He also requests to speak with nutrition about his diet going home. He also requests to speak with a chaplain.    Objective: Temp:  [97.5 F (36.4 C)-99.4 F (37.4 C)] 99.4 F (37.4 C) (02/22 0300) Pulse Rate:  [87-124] 103 (02/22 0300) Resp:  [8-18] 17 (02/22 0300) BP: (74-132)/(42-75) 118/69 (02/22 0300) SpO2:  [95 %-100 %] 100 % (02/22 0300) Physical Exam: General: NAD, rests comfortably in bed Cardiovascular: RRR, no m/r/g Respiratory: CTA bil no W/R/R Abdomen: soft, nt nd, normoactive BS Extremities: Left BKA. Right hip disarticulation with wound vac in place, draining  Laboratory:  Recent Labs Lab 02/06/17 0531 02/07/17 0409 02/08/17 0600  WBC 40.8* 30.6* 25.0*  HGB 7.2* 7.0* 9.6*  HCT 22.1* 21.5* 27.8*  PLT 670* 638* 529*    Recent Labs Lab 02/06/17 0531 02/07/17 0409 02/08/17 0600  NA 130* 129* 129*  K 3.0* 2.8* 3.4*  CL 94* 92* 94*  CO2 BUN 9 6 5*  CREATININE 0.71 0.70 0.51*  CALCIUM 7.8* 7.5* 7.5*  GLUCOSE 295* 240* 169*   Gram stain 2/19 show G- rods, G+ cocci in  pairs; culture pending   Imaging/Diagnostic Tests: No results found.  Howard Pouch, MD 02/08/2017, 9:26 AM PGY-1, [facility] Family Medicine FPTS Intern pager: [phone], text pages welcome

## 2017-02-08 NOTE — Progress Notes (Signed)
Pharmacy Antibiotic Note  Derrick RoseBobby L Holzmann is a 46 y.o. 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  Continues on abx for wound infection of R AKA. Cx's shows Group B strep, actinomyces, and GNRs. Recent right AKA on 01/31/17. Failed Augmentin treatment as outpatient prior to admit. Afebrile, WBC down to 25.0.  Plan: Continue ceftriaxone 2g IV Q24 Monitor clinical picture, renal function  Ortho plan is to continue Rocephin until discharge on Sunday, then 4 weeks of Augmentin.  Height: 6\' 1"  (185.4 cm) Weight: 200 lb (90.7 kg) IBW/kg (Calculated) : 79.9  Temp (24hrs), Avg:98.2 F (36.8 C), Min:97.5 F (36.4 C), Max:99.4 F (37.4 C)   Recent Labs Lab 02/04/17 1753 02/04/17 1807 02/04/17 2119 02/05/17 0752 02/06/17 0531 02/07/17 0409 02/08/17 0600  WBC 41.4*  --   --   --  40.8* 30.6* 25.0*  CREATININE 0.85  --   --  0.65 0.71 0.70 0.51*  LATICACIDVEN  --  2.51* 1.32  --   --   --   --     Estimated Creatinine Clearance: 133.2 mL/min (by C-G formula based on SCr of 0.51 mg/dL (L)).    Allergies  Allergen Reactions  . Vancomycin Other (See Comments)    Acute kidney injury    Antimicrobials this admission: Unasyn 2/18 x 1 Zosyn 2/18 >> 2/19 Levaquin 2/18>> 2/19 Ceftriaxone 2/19 >>  Dose adjustments this admission: n/a  Microbiology results: 2/14 Wound Cx: Group B strep + Actinomyces + mixed flora 2/19 BCx: ngtd 2/19 Wound Cx: Abundant GPC in pairs and GNRs  Thank you for allowing pharmacy to be a part of this patient's care.  Enzo BiNathan Shamari Lofquist, PharmD, BCPS Clinical Pharmacist Pager [phone] 02/08/2017 10:35 AM

## 2017-02-08 NOTE — Progress Notes (Addendum)
Patient ID: Derrick RoseBobby L [name], male   DOB: 10/27/1971, 46 y.o.   MRN: [id number] Postoperative day 1 right hip disarticulation. Examination the wound VAC has minimal drainage. The dressing has a good suction fit. Patient still had an abscess which extended up to his ischial tuberosity. Recommend continue IV antibiotics with discharge Sunday morning with discharge on Augmentin for 4 weeks. Remove wound VAC at time of discharge and applied Mepilex dressing. Orders written for Neurontin to help him with his phantom pain.

## 2017-02-08 NOTE — Progress Notes (Signed)
Occupational Therapy Evaluation Patient Details Name: Derrick [name] MRN: [id number] DOB: [birth year] Today's Date: 02/08/2017    History of Present Illness 45 y.o.malepresenting with right AKA pain, bleeding, and possible infection. Found to have sepsis due to infection and underwend revision of Rt AKA and application of wound vac. 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    Clinical Impression   Pt with significant medical history with loss of BLE within the last year. Prior to this, pt was independent with ADL and mobility, drove, worked and enjoyed multiple leisure activities (bowling, golfing, fishing, watching his boys play baseball) which he is no longer able to do. Pt currently requires Mod A with LB ADL @ bed level and is unable to mobilize at this time due to pain and placement of wound vac. Need clarification on weight bearing through residual of hip disarticulation to reduce any risk of jeopardizing wound integrity. Pt became tearful during session talking about his "loss" and how is has become depressed over the loss of "everything that he enjoys". He and his sister expressed frustration regarding not knowing what resources are available to help him become more independent again. Feel CIR is an excellent option to address pt's physical and psychological components which are affecting his ability to complete his mobility, ADL, IADL and leisure activities and establish a plan to facilitate this pt's ability to function in the community. Pt states he is very motivated to do what it takes to have a "better life".  Will follow acutely to address established goals and facilitate DC to next venue of care.     Follow Up Recommendations  CIR;Supervision/Assistance - 24 hour    Equipment Recommendations  3 in 1 bedside commode (drop arm)    Recommendations for Other Services Rehab consult     Precautions / Restrictions Precautions Precautions:  Fall Precaution Comments: bilateral amputee Required Braces or Orthoses: Other Brace/Splint Other Brace/Splint: has Lt prosthesis at home Restrictions Other Position/Activity Restrictions: unsure of ability to sit on wound area      Mobility Bed Mobility Overal bed mobility: Needs Assistance Bed Mobility: Rolling;Supine to Sit Rolling: Supervision (bilaterally, using rails to assist)            Transfers                 General transfer comment: unable at this time    Balance Overall balance assessment: Needs assistance Sitting-balance support: Bilateral upper extremity supported Sitting balance-Leahy Scale: Poor Sitting balance - Comments: in longsitting position in bed (HOB elevated then returned to flat to assess balance)                                    ADL Overall ADL's : Needs assistance/impaired     Grooming: Set up   Upper Body Bathing: Set up;Bed level   Lower Body Bathing: Minimal assistance;Bed level   Upper Body Dressing : Set up;Bed level   Lower Body Dressing: Minimal assistance;Bed level               Functional mobility during ADLs:  (unable to assess) General ADL Comments: Pt typically uses his L prosthetic leg to mobilize to his hover round, Prosthetic not available for assessment. Also, need clarification regarding wound and weightbearing through incision in chair.     Vision         Perception  Praxis      Pertinent Vitals/Pain Pain Assessment: 0-10 Pain Score: 5  Pain Location: Rt posterior thigh Pain Descriptors / Indicators: Cramping;Spasm;Aching;Burning Pain Intervention(s): Limited activity within patient's tolerance     Hand Dominance Right   Extremity/Trunk Assessment Upper Extremity Assessment Upper Extremity Assessment: Generalized weakness   Lower Extremity Assessment Lower Extremity Assessment: Defer to PT evaluation RLE Deficits / Details: Limited active motion due to reports of  pain, able to lift off bed.  LLE Deficits / Details: able to raise and move independently; Wound vac placed on anterior aspect of residual trunka dn tubing trails posteriorly making if difficult to sit   Cervical / Trunk Assessment Cervical / Trunk Assessment: Normal   Communication Communication Communication: No difficulties   Cognition Arousal/Alertness: Awake/alert Behavior During Therapy: WFL for tasks assessed/performed Overall Cognitive Status: Within Functional Limits for tasks assessed                     General Comments   Pt may benefit from "AHA" support group. Began discussion.     Exercises Exercises: Other exercises Other Exercises Other Exercises: BUE general theraband strengthening level2`   Shoulder Instructions      Home Living Family/patient expects to be discharged to:: Private residence Living Arrangements: Other (Comment);Spouse/significant other (girlfriend) Available Help at Discharge: Friend(s);Family;Available PRN/intermittently (girlfriend, sister) Type of Home: House Home Access: Ramped entrance     Home Layout: One level     Bathroom Shower/Tub: Tub/shower unit Shower/tub characteristics: Engineer, building servicesCurtain Bathroom Toilet: Standard Bathroom Accessibility: Yes How Accessible: Accessible via wheelchair Home Equipment: Art gallery managerlectric scooter;Wheelchair - [employer] Corporationmanual;Walker - 2 wheels   Additional Comments: Psychologist, forensicelectric scooter X2      Prior Functioning/Environment Level of Independence: Independent with assistive device(s)        Comments: Reports using scooter for mobility. Was doing scooter<>bed transfers independently. For the past month pt states he spent alot of time in bed due to R leg pain and mobility issues. 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         OT Problem List: Decreased strength;Decreased activity tolerance;Impaired balance (sitting and/or  standing);Decreased knowledge of use of DME or AE;Decreased knowledge of precautions;Pain;Impaired sensation      OT Treatment/Interventions: Self-care/ADL training;Therapeutic exercise;DME and/or AE instruction;Therapeutic activities;Patient/family education;Balance training    OT Goals(Current goals can be found in the care plan section) Acute Rehab OT Goals Patient Stated Goal: to be able to enjoy some things again OT Goal Formulation: With patient Time For Goal Achievement: 02/22/17 Potential to Achieve Goals: Good ADL Goals Pt Will Perform Lower Body Bathing: with set-up;sitting/lateral leans Pt Will Perform Lower Body Dressing: with set-up;sitting/lateral leans Pt Will Transfer to Toilet: with supervision;bedside commode;squat pivot transfer Pt/caregiver will Perform Home Exercise Program: Increased strength;Both right and left upper extremity;With theraband;Independently  OT Frequency: Min 2X/week   Barriers to D/C:            Co-evaluation              End of Session Nurse Communication: Mobility status  Activity Tolerance: Other (comment) (limited by wound vac placement) Patient left: in bed;with call bell/phone within reach;with family/visitor present  OT Visit Diagnosis: Other abnormalities of gait and mobility (R26.89);Muscle weakness (generalized) (M62.81);Pain Pain - Right/Left: Right Pain - part of body: Hip                ADL either performed or assessed with  clinical judgement  Time: 1459-1535 OT Time Calculation (min): 36 min Charges:  OT General Charges $OT Visit: 1 Procedure OT Treatments $Therapeutic Activity: 8-22 mins G-Codes:     [facility], OT/L  [phone] 02/08/2017  Derrick [name],Derrick [name] 02/08/2017, 4:58 PM

## 2017-02-08 NOTE — Progress Notes (Signed)
Rehab Admissions Coordinator Note:  Patient was screened by Clois DupesBoyette, Labrea Eccleston Godwin for appropriateness for an Inpatient Acute Rehab Consult per OT eval. PT eval rec HH.  At this time, we are recommending await further assessment of pt's ability to participate in more intense therapies before determining rehab venue options. I will follow.   Clois DupesBoyette, Shacarra Choe Godwin 02/08/2017, 5:50 PM  I can be reached at [phone].

## 2017-02-08 NOTE — Anesthesia Postprocedure Evaluation (Addendum)
Anesthesia Post Note  Patient: Derrick [name]  Procedure(s) Performed: Procedure(s) (LRB): REVISION RIGHT AKA (Right) APPLICATION OF WOUND VAC (Right)  Patient location during evaluation: PACU Anesthesia Type: General Level of consciousness: awake and alert Pain management: pain level controlled Vital Signs Assessment: post-procedure vital signs reviewed and stable Respiratory status: aerosol facemask Cardiovascular status: blood pressure returned to baseline and stable Postop Assessment: no signs of nausea or vomiting Anesthetic complications: no       Last Vitals:  Vitals:   02/07/17 2257 02/08/17 0300  BP: 123/74 118/69  Pulse: 99 (!) 103  Resp:  17  Temp:  37.4 C    Last Pain:  Vitals:   02/08/17 0903  TempSrc:   PainSc: 10-Worst pain ever                 Shiela Bruns,JAMES TERRILL

## 2017-02-08 NOTE — Progress Notes (Signed)
Paged family med for pt's CBG:390. No HS coverage per order. Pt prefers no insulin over 10 units. Awaiting response. Will continue to monitor.

## 2017-02-08 NOTE — Progress Notes (Signed)
PT Cancellation Note  Patient Details Name: Derrick [name] MRN: [id number] DOB: 05/18/1971   Cancelled Treatment:    Reason Eval/Treat Not Completed: Patient refusing PT services, states that it is too painful to sit or move with placement of VAC. PT will continue to follow and progress mobility.    Christiane HaBenjamin J. Amore Ackman, PT, CSCS Pager [phone] Office [phone]  02/08/2017, 4:48 PM

## 2017-02-09 DIAGNOSIS — R509 Fever, unspecified: Secondary | ICD-10-CM

## 2017-02-09 LAB — BASIC METABOLIC PANEL
Anion gap: 9 (ref 5–15)
CHLORIDE: 95 mmol/L — AB (ref 101–111)
CO2: 26 mmol/L (ref 22–32)
Calcium: 7.7 mg/dL — ABNORMAL LOW (ref 8.9–10.3)
Creatinine, Ser: 0.54 mg/dL — ABNORMAL LOW (ref 0.61–1.24)
GFR calc Af Amer: >60 mL/min (ref >60)
GFR calc non Af Amer: >60 mL/min (ref >60)
GLUCOSE: 245 mg/dL — AB (ref 65–99)
POTASSIUM: 4 mmol/L (ref 3.5–5.1)
Sodium: 130 mmol/L — ABNORMAL LOW (ref 135–145)

## 2017-02-09 LAB — GLUCOSE, CAPILLARY
Glucose-Capillary: 247 mg/dL — ABNORMAL HIGH (ref 65–99)
Glucose-Capillary: 195 mg/dL — ABNORMAL HIGH (ref 65–99)
Glucose-Capillary: 300 mg/dL — ABNORMAL HIGH (ref 65–99)
Glucose-Capillary: 190 mg/dL — ABNORMAL HIGH (ref 65–99)

## 2017-02-09 LAB — CBC
HCT: 27.9 % — ABNORMAL LOW (ref 39.0–52.0)
HEMOGLOBIN: 9.5 g/dL — AB (ref 13.0–17.0)
MCH: 27.5 pg (ref 26.0–34.0)
MCHC: 34.1 g/dL (ref 30.0–36.0)
MCV: 80.9 fL (ref 78.0–100.0)
PLATELETS: 567 10*3/uL — AB (ref 150–400)
RBC: 3.45 MIL/uL — ABNORMAL LOW (ref 4.22–5.81)
RDW: 15.5 % (ref 11.5–15.5)
WBC: 20.2 10*3/uL — ABNORMAL HIGH (ref 4.0–10.5)

## 2017-02-09 LAB — CULTURE, BLOOD (ROUTINE X 2)
CULTURE: NO GROWTH
Culture: NO GROWTH

## 2017-02-09 MED ORDER — GLIPIZIDE 5 MG PO TABS: 2.5000 mg | ORAL_TABLET | Freq: Every day | ORAL | Status: DC

## 2017-02-09 MED ORDER — GLIPIZIDE 5 MG PO TABS
2.5000 mg | ORAL_TABLET | Freq: Every day | ORAL | Status: DC
  Administered 2017-02-10 – 2017-02-11 (×2): 2.5 mg via ORAL
  Filled 2017-02-09 (×2): qty 1

## 2017-02-09 MED ORDER — METFORMIN HCL ER 500 MG PO TB24
500.0000 mg | ORAL_TABLET | Freq: Every day | ORAL | Status: DC
  Filled 2017-02-09 (×2): qty 1

## 2017-02-09 MED ORDER — DEXTROSE 5 % IV SOLN
2.0000 g | INTRAVENOUS | Status: DC
  Administered 2017-02-09 – 2017-02-10 (×2): 2 g via INTRAVENOUS
  Filled 2017-02-09 (×2): qty 2

## 2017-02-09 NOTE — Progress Notes (Signed)
Temp was rechecked because RN that took his vital signs stated that he was stable. Temp was 98.5 oral. MD called and I informed that patient did not have temp lungs were clear. Informed MD of the incident of questionable confusion by family member. Will continue to monitor. Ilean SkillVeronica Edona Schreffler LPN

## 2017-02-09 NOTE — Progress Notes (Signed)
   02/09/17 0950  Clinical Encounter Type  Visited With Patient and family together  Visit Type Other (Comment) ([place] consult)  Spiritual Encounters  Spiritual Needs Emotional  Stress Factors  Patient Stress Factors None identified  Family Stress Factors None identified  Pt and wife seen before. Gave a blessing as they had questions on the living will.

## 2017-02-09 NOTE — Progress Notes (Signed)
Brief nutrition education note  RD consulted for DM diet education.   Provided pt handout "Carbohydrate Counting for People with Diabetes"   Pt has received prior DM education. Pt does not follow any diet at home. Pt drinks regular mountain dew sodas.  Pt did not seem interested in education.  Sister at bedside reports pt knows what changes he needs to make just has to follow through.  Plans for d/c home Sunday. Recommend outpatient diet education when pt is ready to make dietary changes.  Pt has a good appetite and is eating well.   No further nutritional interventions noted at this time.   Fransisca KaufmannAllison Ioannides Dietetic Intern

## 2017-02-09 NOTE — Care Management Important Message (Signed)
Important Message  Patient Details  Name: Berneda RoseBobby L Crew MRN: [id number] Date of Birth: 09/25/1971   Medicare Important Message Given:  Yes    Kyla BalzarineShealy, Taite Baldassari Abena 02/09/2017, 2:09 PM

## 2017-02-09 NOTE — Progress Notes (Signed)
Based on pt's current ability to work with therapy today, not at a level for more intense therapies required of an inpt rehab admission. Pt able to prop on elbows, but unwilling to come to sitting per therapy note. Recommend HH or SNF at this time.  [phone]

## 2017-02-09 NOTE — Progress Notes (Signed)
Occupational Therapy Treatment Patient Details Name: Derrick RoseBobby L Pyle MRN: [id number] DOB: 04/19/1971 Today's Date: 02/09/2017    History of present illness 46 y.o.malepresenting with right AKA pain, bleeding, and possible infection. Found to have sepsis due to infection and underwend revision of Rt AKA and application of wound vac. 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 Wound vac removed 2/23.PMH: L BKA, neuopathy, diabetes.    OT comments  Wound vac removed prior to session. Pt agreeable to therapy. Mod A with bed mobility and squat pivot transfer to recliner with use of L LE prosthesis. Pt states this is the first time he has been in a chair in 2 months. Pt able to instruct his sister in helping him donn his prosthesis @ bed level. BP 124/77 sitting HR 106. Pt with initial complaints of dizziness, but symptoms resolved and VSS. Continue to feel pt would benefit from CIR for rehab to maximize his functional level of independence and facilitate safe DC home. Pt appreciative of help.  Follow Up Recommendations  CIR;Supervision/Assistance - 24 hour    Equipment Recommendations  3 in 1 bedside commode (drop arm)    Recommendations for Other Services Rehab consult    Precautions / Restrictions Precautions Precautions: Fall Precaution Comments: bilateral amputee Required Braces or Orthoses: Other Brace/Splint Other Brace/Splint: Lt BKA prosthesis        Mobility Bed Mobility Overal bed mobility: Needs Assistance Bed Mobility: Supine to Sit Rolling: Supervision   Supine to sit: Mod assist     General bed mobility comments: A to elevate trunk to sitting EOB  Transfers Overall transfer level: Needs assistance   Transfers: Squat Pivot Transfers     Squat pivot transfers: From elevated surface;Mod assist     General transfer comment: fatigued after transfer but willing to participate.    Balance Overall balance assessment: Needs assistance   Sitting  balance-Leahy Scale: Poor Sitting balance - Comments: EOB                           ADL Overall ADL's : Needs assistance/impaired                     Lower Body Dressing: Moderate assistance Lower Body Dressing Details (indicate cue type and reason): to donn prosthesis  Sister trying to get pt to EOB when walking into room. Addressed situation and encouraged pt to recline to sidelying in order for therapist to assess. PT with weak BUE and is unable to push from sidelying to sit without mod A. Sitting balance is poor in sitting and pt is at too high of a fall risk at this time to donn prosthesis EOB. Explained to sister who verbalized understanding.            Functional mobility during ADLs: Moderate assistance;Cueing for safety (squat pivot) General ADL Comments: Wound vac removed. Pt used prosthetic leg to assist with squat pivot transfer to chair       Vision                     Perception     Praxis      Cognition   Behavior During Therapy: [facility]FL for tasks assessed/performed;Agitated Overall Cognitive Status: Within Functional Limits for tasks assessed                         Exercises Other Exercises Other Exercises: encouraged theraband exercises for UB  Shoulder Instructions       General Comments      Pertinent Vitals/ Pain       Pain Assessment: 0-10 Pain Score: 10-Worst pain ever Pain Location: Rt pelvis Pain Descriptors / Indicators: Aching;Constant;Burning Pain Intervention(s): Limited activity within patient's tolerance  Home Living                                          Prior Functioning/Environment              Frequency  Min 2X/week        Progress Toward Goals  OT Goals(current goals can now be found in the care plan section)  Progress towards OT goals: Progressing toward goals  Acute Rehab OT Goals Patient Stated Goal: Get some rehab OT Goal Formulation: With patient Time  For Goal Achievement: 02/22/17 Potential to Achieve Goals: Good ADL Goals Pt Will Perform Lower Body Bathing: with set-up;sitting/lateral leans Pt Will Perform Lower Body Dressing: with set-up;sitting/lateral leans Pt Will Transfer to Toilet: with supervision;bedside commode;squat pivot transfer Pt/caregiver will Perform Home Exercise Program: Increased strength;Both right and left upper extremity;With theraband;Independently  Plan Discharge plan remains appropriate    Co-evaluation                 End of Session Equipment Utilized During Treatment: Gait belt  OT Visit Diagnosis: Pain;Muscle weakness (generalized) (M62.81);Other abnormalities of gait and mobility (R26.89) Pain - Right/Left: Right Pain - part of body: Hip   Activity Tolerance Patient tolerated treatment well   Patient Left in chair;with call bell/phone within reach;with family/visitor present   Nurse Communication Mobility status;Precautions        Time: 1610-9604 OT Time Calculation (min): 25 min  Charges: OT General Charges $OT Visit: 1 Procedure OT Treatments $Self Care/Home Management : 23-37 mins  [facility], OT/L  [phone] 02/09/2017   Janmichael Giraud,HILLARY 02/09/2017, 4:05 PM

## 2017-02-09 NOTE — Progress Notes (Signed)
Physical Therapy Treatment Patient Details Name: Derrick [name] MRN: [id number] DOB: 31-Oct-1971 Today's Date: 02/09/2017    History of Present Illness 45 y.o.malepresenting with right AKA pain, bleeding, and possible infection. Found to have sepsis due to infection and underwend revision of Rt AKA and application of wound vac. 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     PT Comments    Pt returning to surgery on 02/07/17 with resulting Rt hip disarticulation and VAC placement. At this time the patient's mobility is significantly limited related to weakness and pain. The pt was willing to attempt bed mobility including rolling and supine on elbows. Encouraging pt to attempt sitting up in bed or at EOB but pt refusing, stating that it will hurt too bad. Discussing at length, need to attempt sitting but pt continuing to refuse. The pt verbalizes that he wants to work with therapy and even brought in Lt prosthesis but cannot stand sitting right now. Based upon the patient's decreased strength and limited mobility, recommending CIR for further rehabilitation following acute stay. Pt states that he is agreeable with this possible option if he can have his own room, go outside to smoke, and have his sister visit in the evenings.    Follow Up Recommendations  CIR     Equipment Recommendations  Wheelchair (measurements PT);Wheelchair cushion (measurements PT)    Recommendations for Other Services       Precautions / Restrictions Precautions Precautions: Fall Other Brace/Splint: Lt BKA prosthesis  Restrictions Weight Bearing Restrictions: No    Mobility  Bed Mobility Overal bed mobility: Needs Assistance Bed Mobility: Rolling Rolling: Supervision (toward Lt side, using rail to assist)         General bed mobility comments: Pt able to prop up on elbows in bed but unwilling to come to sitting.   Transfers                     Ambulation/Gait                 Stairs            Wheelchair Mobility    Modified Rankin (Stroke Patients Only)       Balance                                    Cognition Arousal/Alertness: Awake/alert Behavior During Therapy: WFL for tasks assessed/performed;Agitated (at times agitated/frustrated ) Overall Cognitive Status: Within Functional Limits for tasks assessed                 General Comments: Pt getting agitated/frustrated with pain when sitting, wound VAC, and food services.     Exercises Other Exercises Other Exercises: Lt LE resisted SLR, hip abd/add each 1X10 Other Exercises: Lt hamstring stretch    General Comments        Pertinent Vitals/Pain Pain Assessment: Faces Faces Pain Scale: Hurts little more Pain Location: Rt pelvis Pain Descriptors / Indicators: Aching Pain Intervention(s): Limited activity within patient's tolerance;Monitored during session    Home Living                      Prior Function            PT Goals (current goals can now be found in the care plan section) Acute Rehab PT Goals Patient Stated Goal: Get some rehab PT Goal  Formulation: With patient Time For Goal Achievement: 02/20/17 Potential to Achieve Goals: Good Progress towards PT goals: Progressing toward goals    Frequency    Min 3X/week      PT Plan Discharge plan needs to be updated    Co-evaluation             End of Session   Activity Tolerance: Patient limited by pain Patient left: in bed;with call bell/phone within reach;with family/visitor present Nurse Communication: Mobility status PT Visit Diagnosis: Muscle weakness (generalized) (M62.81);Difficulty in walking, not elsewhere classified (R26.2)     Time: 1112-1201 PT Time Calculation (min) (ACUTE ONLY): 49 min  Charges:  $Therapeutic Exercise: 8-22 mins $Therapeutic Activity: 8-22 mins                    G Codes:       Christiane HaBenjamin J.  Yarel Rushlow, PT, CSCS Pager [phone] Office [phone]  02/09/2017, 12:15 PM

## 2017-02-09 NOTE — Progress Notes (Signed)
I was called into patient room and family members stated that patient woke up confused and disoriented and that he was hot and sweaty. Vitals were taken and CBG was also taken. Pt sister said that he speaking about seeing cats in the room and cats crawling on wall and ceiling. Charge RN was present and when asked about orientation he was alert x4. He remembered when I gave him his 0100 pain medication. I did not see any change in his demeanor. Temp was elevated 102.2 notify MD about that. Will continue to monitor. Ilean SkillVeronica Tmya Wigington LPN

## 2017-02-09 NOTE — Progress Notes (Signed)
I spoke with Attending team per their request to answer questions [facility] Average LOS is 10- 12 days, we are a non smoking facility/campus and he would have to demonstrate tolerance to participate three hours per day to be considered for admission. Medicare does cover admission based on Rehab MD assessments that they are a candidate. Noted that Wound VAC removed. I will follow his progress with therapies. [phone]

## 2017-02-09 NOTE — Progress Notes (Signed)
Family Medicine Teaching Service Daily Progress Note Intern Pager: [phone]  Patient name: Derrick [name] Medical record number: [id number] Date of birth: 1971-02-10 Age: 46 y.o. Gender: male  Primary Care Provider: Pcp Not In System Consultants: Orthopedics Code Status: DNR  Pt Overview and Major Events to Date:  2/18 admit to FPTS 2/19 Plan for surgical I&D of right AKA by Ortho 2/21 R hip disarticulation  Assessment and Plan: Derrick [name] is a 46 y.o. male presenting with right AKA pain, bleeding, and possible infection. PMH is significant for poorly controlled DM, smoker.   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 S/p I&D 2/19  by ortho with wound vac placed, repeat culture shows GBS (S agalactiae) sensitive to ceftriaxone. Now day #2 s/p R hip disarticulation (2/21). BCx NGTD.   - Continue ceftriaxone - blood cultures no growth x 4 days - Will f/u ortho recs - plan for DC Sunday. Recommend continue IV antibiotics with discharge Sunday morning with discharge on Augmentin for 4 weeks.  - per ortho remove drain on Sunday prior to d/c. Patient would like to get this removed sooner as it is extremely uncomfortable. Will discuss with ortho. - pain control with percocet 7.5-325 q4H, ibuprofen 600 mg q6H prn - PT/OT for CIR/rehab needs  DM2, uncontrolled A1c 14.5 on 11/2017. Does not take his home insulin. CBG 247 this morning. Refused sliding scale >10u so 10u novolog given overnight. Used 20u Aspart total yesterday. - Continue lantus 10 units - moderate sliding scale  Anemia, improved - unclear etiology, may be iron deficiency anemia, unable to assess with apparent transfusion yesterday. Hgb stable at 9.5 today - continue to monitor  Elevated Platelets, improved. (529 from 1001), possibly due to recent surgery and infection (acute phase reactant) vs iron deficiency anemia - continue to monitor   Hypokalemia,  improved: - K 4.0 today - continue to monitor  Hyponatremia, persistent:- trend 133 >129 >130 - unclear cause, continue to monitor  Tobacco Use: 2-3ppd - nicotine patch - continue to counsel cessation  FEN/GI: carb modified diet Prophylaxis: per ortho team  Disposition: Home with home health pending clinical improvement  Subjective:  Patient doing well today, denies fevers, chills. Has pain where wound vac is, very uncomfortable to put weight on it or try to sit up which is interfering him participating in PT. He is open to CIR/rehab but is moving to outside of [facility] next week and does not want to be too far from family. Also open to home health PT. Plans to resume smoking.   Objective: Temp:  [98.5 F (36.9 C)-102.2 F (39 C)] 98.5 F (36.9 C) (02/23 0507) Pulse Rate:  [96-97] 96 (02/23 0400) Resp:  [18] 18 (02/23 0400) BP: (122-141)/(72-85) 141/85 (02/23 0400) SpO2:  [98 %-100 %] 98 % (02/23 0400) Physical Exam: General: NAD, rests comfortably in bed Cardiovascular: RRR, no m/r/g Respiratory: CTA bilaterally, no increased work of breathing Abdomen: soft, nt nd, normoactive BS Extremities: Left BKA. Right hip disarticulation with wound vac in place, draining scant amount of serosanguinous fluid  Laboratory:  Recent Labs Lab 02/07/17 0409 02/08/17 0600 02/09/17 0326  WBC 30.6* 25.0* 20.2*  HGB 7.0* 9.6* 9.5*  HCT 21.5* 27.8* 27.9*  PLT 638* 529* 567*    Recent Labs Lab 02/07/17 0409 02/08/17 0600 02/09/17 0326  NA 129* 129* 130*  K 2.8* 3.4* 4.0  CL 92* 94* 95*  CO2  27 25 26   BUN 6 5* <5*  CREATININE 0.70 0.51* 0.54*  CALCIUM 7.5* 7.5* 7.7*  GLUCOSE 240* 169* 245*   Gram stain 2/19 show G- rods, G+ cocci in pairs; culture pending  Imaging/Diagnostic Tests: No results found.  Tillman SersAngela C Riccio, DO 02/09/2017, 7:26 AM PGY-1, [facility] FPTS Intern pager: [phone], text pages welcome

## 2017-02-10 LAB — AEROBIC/ANAEROBIC CULTURE W GRAM STAIN (SURGICAL/DEEP WOUND)

## 2017-02-10 LAB — AEROBIC/ANAEROBIC CULTURE (SURGICAL/DEEP WOUND)

## 2017-02-10 LAB — GLUCOSE, CAPILLARY
GLUCOSE-CAPILLARY: 341 mg/dL — AB (ref 65–99)
GLUCOSE-CAPILLARY: 166 mg/dL — AB (ref 65–99)
GLUCOSE-CAPILLARY: 179 mg/dL — AB (ref 65–99)
Glucose-Capillary: 325 mg/dL — ABNORMAL HIGH (ref 65–99)
Glucose-Capillary: 236 mg/dL — ABNORMAL HIGH (ref 65–99)
Glucose-Capillary: 252 mg/dL — ABNORMAL HIGH (ref 65–99)

## 2017-02-10 MED ORDER — GABAPENTIN 300 MG PO CAPS
300.0000 mg | ORAL_CAPSULE | Freq: Two times a day (BID) | ORAL | Status: DC
  Administered 2017-02-10 – 2017-02-11 (×2): 300 mg via ORAL
  Filled 2017-02-10 (×2): qty 1

## 2017-02-10 MED ORDER — GABAPENTIN 600 MG PO TABS
600.0000 mg | ORAL_TABLET | Freq: Every day | ORAL | Status: DC
  Administered 2017-02-10: 600 mg via ORAL
  Filled 2017-02-10: qty 1

## 2017-02-10 MED ORDER — INSULIN GLARGINE 100 UNIT/ML ~~LOC~~ SOLN
5.0000 [IU] | Freq: Once | SUBCUTANEOUS | Status: AC
  Administered 2017-02-10: 5 [IU] via SUBCUTANEOUS
  Filled 2017-02-10: qty 0.05

## 2017-02-10 MED ORDER — INSULIN GLARGINE 100 UNIT/ML ~~LOC~~ SOLN
15.0000 [IU] | Freq: Every day | SUBCUTANEOUS | Status: DC
  Administered 2017-02-10: 15 [IU] via SUBCUTANEOUS
  Filled 2017-02-10: qty 0.15

## 2017-02-10 NOTE — Progress Notes (Signed)
Occupational Therapy Treatment Patient Details Name: Derrick [name] MRN: [id number] DOB: [birth year] Today's Date: 02/10/2017    History of present illness 46 y.o.malepresenting with right AKA pain, bleeding, and possible infection. Found to have sepsis due to infection and underwend revision of Rt AKA and application of wound vac. 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 Wound vac removed 2/23.PMH: L BKA, neuopathy, diabetes.    OT comments  Pt limited this pm due to orthostasis.   He reports (and family confirms), that he had just returned from bed <> w/c<> commode transfer using prosthesis, and had what sounded like orthostasis, and now worn out during session.  He was very agreeable to EOB sitting.  He requires mod A to move to EOB.  BP supine 124/80 HR 102;  BP EOB sitting 95/64(103) with pt symptomatic.  Unable to attempt transfer.    He does not have adequate assistance during the day, and needs to be mod I at discharge, which I do believe is attainable for him.  Continue to recommend CIR.     Follow Up Recommendations  CIR;Supervision/Assistance - 24 hour  (if Pt discharges home, Recommend HHOT, HHPT, HHaide, HHRN)   Equipment Recommendations  3 in 1 bedside commode;Hospital bed    Recommendations for Other Services      Precautions / Restrictions Precautions Precautions: Fall Precaution Comments: bilateral amputee Required Braces or Orthoses: Other Brace/Splint Other Brace/Splint: Lt BKA prosthesis        Mobility Bed Mobility Overal bed mobility: Needs Assistance Bed Mobility: Supine to Sit;Sit to Supine Rolling: Supervision   Supine to sit: Mod assist Sit to supine: Min guard   General bed mobility comments: heavy reliance on bedrail.  Assist to elevate trunk   Transfers                 General transfer comment: Pt reports he just returned to bed after transferring bed <> w/c<> commode with assist.  He reports feeling faint and fatigued after  (described symptoms consistent with orthostasis).  Pt reports feeling too fatigued after this to try transfer again.  He moved supine with BP 124/80, HR 102 to sitting with BP drop 95/64, HR 103.  BP 120/71 after sitting x ~5 mins EOB     Balance Overall balance assessment: Needs assistance Sitting-balance support: Bilateral upper extremity supported Sitting balance-Leahy Scale: Poor Sitting balance - Comments: Pt sat EOB >20 mins with min guard assist to min A  Postural control: Posterior lean                         ADL Overall ADL's : Needs assistance/impaired             Lower Body Bathing: Minimal assistance;Bed level       Lower Body Dressing: Moderate assistance Lower Body Dressing Details (indicate cue type and reason): to donn prosthesis   Toilet Transfer Details (indicate cue type and reason): Pt reports he just returned from BR and performed bed <>w/c <> commode transfer with assist                   Vision                     Perception     Praxis      Cognition   Behavior During Therapy: [facility] for tasks assessed/performed Overall Cognitive Status: Within Functional Limits for tasks assessed  Exercises     Shoulder Instructions       General Comments      Pertinent Vitals/ Pain       Pain Assessment: Faces Faces Pain Scale: Hurts little more Pain Location: Rt buttock  Pain Descriptors / Indicators: Aching;Sore;Jabbing Pain Intervention(s): Monitored during session;Limited activity within patient's tolerance  Home Living                                          Prior Functioning/Environment              Frequency  Min 2X/week        Progress Toward Goals  OT Goals(current goals can now be found in the care plan section)  Progress towards OT goals: Progressing toward goals     Plan Discharge plan remains appropriate    Co-evaluation    PT/OT/SLP  Co-Evaluation/Treatment: Yes Reason for Co-Treatment: For patient/therapist safety   OT goals addressed during session: ADL's and self-care      End of Session    OT Visit Diagnosis: Pain;Muscle weakness (generalized) (M62.81);Other abnormalities of gait and mobility (R26.89) Pain - Right/Left: Right Pain - part of body: Hip   Activity Tolerance Patient limited by fatigue;Treatment limited secondary to medical complications (Comment) (orthostasis )   Patient Left in bed;with call bell/phone within reach;with bed alarm set;with family/visitor present   Nurse Communication Mobility status        Time: 1415-1500 OT Time Calculation (min): 45 min  Charges: OT General Charges $OT Visit: 1 Procedure OT Treatments $Therapeutic Activity: 8-22 mins  Reynolds AmericanWendi Flo Berroa, OTR/L [phone]    Jeani HawkingConarpe, Myriam Brandhorst M 02/10/2017, 3:42 PM

## 2017-02-10 NOTE — Progress Notes (Signed)
Physical Therapy Treatment Patient Details Name: Berneda RoseBobby L Kittell MRN: [id number] DOB: 01/04/1971 Today's Date: 02/10/2017    History of Present Illness 46 y.o.malepresenting with right AKA pain, bleeding, and possible infection. Found to have sepsis due to infection and underwend revision of Rt AKA and application of wound vac. 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 Wound vac removed 2/23.PMH: L BKA, neuopathy, diabetes.     PT Comments    Patient seen with OT for progressing upright and tolerance to activity.  Patient was orthostatic initially upon sitting upright, but was able to rebound up at EOB after about 3-5 minutes.  Sat about 20 minutes discussing plans and importance of safety/compliance.  Patient continues to be high fall risk and for re-infection if d/c home.  Continue to recommend CIR level rehab at d/c for continued healing/wound care with IV antibiotics, progressing tolerance to upright and independence and safety with mobility as pain continues to improve.    Follow Up Recommendations  CIR (if pt d/c home will need HHPT/OT/aide/RN )     Equipment Recommendations  Wheelchair (measurements PT);Wheelchair cushion (measurements PT)    Recommendations for Other Services       Precautions / Restrictions Precautions Precautions: Fall Precaution Comments: bilateral amputee Required Braces or Orthoses: Other Brace/Splint Other Brace/Splint: Lt BKA prosthesis  Restrictions Other Position/Activity Restrictions: unsure of ability to sit on wound area    Mobility  Bed Mobility Overal bed mobility: Needs Assistance Bed Mobility: Supine to Sit;Sit to Supine Rolling: Supervision   Supine to sit: Mod assist Sit to supine: Min guard   General bed mobility comments: heavy reliance on bedrail.  Assist to elevate trunk   Transfers                 General transfer comment: Pt reports he just returned to bed after transferring bed <> w/c<> commode with  assist.  He reports feeling faint and fatigued after (described symptoms consistent with orthostasis).  Pt reports feeling too fatigued after this to try transfer again.  He moved supine with BP 124/80, HR 102 to sitting with BP drop 95/64, HR 103.  BP 120/71 after sitting x ~5 mins EOB   Ambulation/Gait                 Stairs            Wheelchair Mobility    Modified Rankin (Stroke Patients Only)       Balance Overall balance assessment: Needs assistance Sitting-balance support: Bilateral upper extremity supported Sitting balance-Leahy Scale: Poor Sitting balance - Comments: Pt sat EOB >20 mins with min guard assist to min A  Postural control: Posterior lean                          Cognition Arousal/Alertness: Awake/alert Behavior During Therapy: WFL for tasks assessed/performed Overall Cognitive Status: Within Functional Limits for tasks assessed                      Exercises      General Comments General comments (skin integrity, edema, etc.): Orthostatic initially sitting, but improved with rebound after 2-3 minutes      Pertinent Vitals/Pain Pain Assessment: Faces Faces Pain Scale: Hurts little more Pain Location: Rt buttock  Pain Descriptors / Indicators: Aching;Sore;Jabbing Pain Intervention(s): Monitored during session;Repositioned;Limited activity within patient's tolerance    Home Living  Prior Function            PT Goals (current goals can now be found in the care plan section) Progress towards PT goals: Progressing toward goals    Frequency    Min 3X/week      PT Plan Current plan remains appropriate    Co-evaluation PT/OT/SLP Co-Evaluation/Treatment: Yes Reason for Co-Treatment: For patient/therapist safety PT goals addressed during session: Mobility/safety with mobility;Balance OT goals addressed during session: ADL's and self-care     End of Session   Activity Tolerance:  Patient limited by fatigue Patient left: in bed;with call bell/phone within reach;with family/visitor present   PT Visit Diagnosis: Muscle weakness (generalized) (M62.81);Difficulty in walking, not elsewhere classified (R26.2)     Time: 1415-1500 PT Time Calculation (min) (ACUTE ONLY): 45 min  Charges:  $Therapeutic Activity: 8-22 mins                    G Codes:       Elray Mcgregor 02-17-2017, 4:53 PM  Sheran Lawless, PT [phone] 2017/02/17

## 2017-02-10 NOTE — Progress Notes (Signed)
Cm received call from RN to please speak with pt concerning DC plan. [facility] and will begin start of care upon discharge (already set up by previous CM(. Pt has wheelchair in room.  This CM gave pt Private Duty agency List and family verbalized understanding this is not covered by insurance and is an out of pocket expense.  CM also gave family CAPS information which they state they will pursue [place] [facility] [place] and establishment with a [facility] PCP.  No other CM needs were communicated.

## 2017-02-10 NOTE — Progress Notes (Signed)
PT Cancellation Note  Patient Details Name: Derrick [name] MRN: [id number] DOB: 08/31/1971   Cancelled Treatment:    Reason Eval/Treat Not Completed: Patient declined, no reason specified (Pt reports his sister just left to get him a sub and if he gets up now he will be to sick to eat when she gets back.  Pt reports pain and states, " I am not giving up I would just prefer to do it later.")   Kasidi Shanker Artis DelayJ Keshav Winegar 02/10/2017, 11:15 AM Joycelyn RuaAimee Axil Copeman, PTA pager [phone]

## 2017-02-10 NOTE — Progress Notes (Signed)
Family Medicine Teaching Service Daily Progress Note Intern Pager: [phone]  Patient name: Derrick [name] Medical record number: [id number] Date of birth: 03/01/1971 Age: 46 y.o. Gender: male  Primary Care Provider: Pcp Not In System Consultants: Orthopedics Code Status: DNR  Pt Overview and Major Events to Date:  2/18 admit to FPTS 2/19 Plan for surgical I&D of right AKA by Ortho 2/21 R hip disarticulation  Assessment and Plan: Derrick [name] is a 46 y.o. male presenting with right AKA pain, bleeding, and possible infection. PMH is significant for poorly controlled DM, smoker.   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 S/p I&D 2/19  by ortho with wound vac, repeat culture shows GBS (S agalactiae) sensitive to ceftriaxone. BCx NGTD.   - Post-op day #3: s/p R hip disarticulation (2/21) - Continue ceftriaxone >> will DC after 2/25 dose - blood cultures: no growth - Will f/u ortho recs - plan for DC Sunday. Recommend continue IV antibiotics with discharge Sunday morning with discharge on Augmentin for 4 weeks.  - Removed wound vac on 2/23. - pain control: percocet 7.5-325 q4H, ibuprofen 600 mg q6H prn, Robaxin 500mg  Q6hr PRN  - Increase gabapentin from 300mg  TID to 300mg  in AM and afternoon, 600mg  at bedtime. - PT/OT for CIR/rehab needs >> patient appears to likely refuse CIR  DM2, uncontrolled A1c 14.5 on 11/2017. Does not take his home insulin. CBG currently in 300s. Was refusing sliding scale >10u. - Lantus currently 10 units daily >> increasing to 15U daily - Patient does not want to take Metformin anymore (ever) due to GI side effects, patient ok with Lantus increase if metformin removed >> DC metformin, increase lantus as above. - moderate sliding scale  Anemia, improved - unclear etiology, may be iron deficiency anemia, unable to assess with apparent transfusion yesterday. Hgb stable at 9.5 on 2/23 - Will  recheck on 2/25  Elevated Platelets, improved. (529 from 1001), possibly due to recent surgery and infection (acute phase reactant) vs iron deficiency anemia - Will recheck on 2/25  Hypokalemia, improved: - K 4.0 on 2/23 - Will recheck on 2/25  Hyponatremia, persistent/stable:- trend 133 >129 >130; unclear etiology - Will recheck on 2/25  Tobacco Use: 2-3ppd - nicotine patch - continue to counsel cessation  FEN/GI: carb modified diet Prophylaxis: per ortho team  Disposition: Home with home health pending clinical improvement  Subjective:  Patient doing well today, denies fevers, chills. Happy to have wound vac off. Voiding and stooling well. No other issues at this time.   Objective: Temp:  [98.4 F (36.9 C)-99.2 F (37.3 C)] 98.5 F (36.9 C) (02/24 0523) Pulse Rate:  [93-106] 93 (02/24 0523) BP: (121-137)/(74-81) 133/74 (02/24 0523) SpO2:  [98 %-100 %] 100 % (02/24 0523) Physical Exam: General: NAD, rests comfortably in bed Cardiovascular: RRR, no m/r/g Respiratory: CTA bilaterally, no increased work of breathing Abdomen: soft, nt nd, normoactive BS Extremities: Left BKA. Right hip disarticulation with dressing in place, no significant drainage appreciated  Laboratory:  Recent Labs Lab 02/07/17 0409 02/08/17 0600 02/09/17 0326  WBC 30.6* 25.0* 20.2*  HGB 7.0* 9.6* 9.5*  HCT 21.5* 27.8* 27.9*  PLT 638* 529* 567*    Recent Labs Lab 02/07/17 0409 02/08/17 0600 02/09/17 0326  NA 129* 129* 130*  K 2.8* 3.4* 4.0  CL 92* 94* 95*  CO2 27 25 26   BUN 6 5* <5*  CREATININE 0.70 0.51* 0.54*  CALCIUM 7.5* 7.5* 7.7*  GLUCOSE 240* 169* 245*   Gram stain 2/19 show G- rods, G+ cocci in pairs; culture pending  Imaging/Diagnostic Tests: No results found.  Kathee Delton, MD 02/10/2017, 11:49 AM PGY-3, [facility] FPTS Intern pager: [phone], text pages welcome

## 2017-02-10 NOTE — Anesthesia Postprocedure Evaluation (Addendum)
Anesthesia Post Note  Patient: Burnis L Gabrys  Procedure(s) Performed: Procedure(s) (LRB): RIGHT HIP DISARTICULATION (Right)  Patient location during evaluation: PACU Anesthesia Type: General Level of consciousness: awake and alert Pain management: pain level controlled Vital Signs Assessment: post-procedure vital signs reviewed and stable Respiratory status: spontaneous breathing, nonlabored ventilation, respiratory function stable and patient connected to nasal cannula oxygen Cardiovascular status: blood pressure returned to baseline and stable Postop Assessment: no signs of nausea or vomiting Anesthetic complications: no       Last Vitals:  Vitals:   02/10/17 0523 02/10/17 1537  BP: 133/74 125/72  Pulse: 93 96  Resp:  18  Temp: 36.9 C 37 C    Last Pain:  Vitals:   02/10/17 1806  TempSrc:   PainSc: 5                  Jaeger Trueheart

## 2017-02-11 LAB — GLUCOSE, CAPILLARY: Glucose-Capillary: 303 mg/dL — ABNORMAL HIGH (ref 65–99)

## 2017-02-11 MED ORDER — AMOXICILLIN-POT CLAVULANATE 875-125 MG PO TABS: 1.0000 | ORAL_TABLET | Freq: Two times a day (BID) | ORAL | 0 refills | Status: AC

## 2017-02-11 MED ORDER — PROBIOTIC 250 MG PO CAPS: 1.0000 | ORAL_CAPSULE | Freq: Every day | ORAL | 0 refills | Status: AC

## 2017-02-11 MED ORDER — INSULIN GLARGINE 100 UNIT/ML SOLOSTAR PEN: 15.0000 [IU] | PEN_INJECTOR | Freq: Every day | SUBCUTANEOUS | 0 refills | Status: DC

## 2017-02-11 MED ORDER — DEXTROSE 5 % IV SOLN
2.0000 g | INTRAVENOUS | Status: DC
  Filled 2017-02-11: qty 2

## 2017-02-11 MED ORDER — OXYCODONE-ACETAMINOPHEN 7.5-325 MG PO TABS: 1.0000 | ORAL_TABLET | Freq: Four times a day (QID) | ORAL | 0 refills | Status: DC | PRN

## 2017-02-11 MED ORDER — GLIPIZIDE 5 MG PO TABS: 2.5000 mg | ORAL_TABLET | Freq: Every day | ORAL | 0 refills | Status: AC

## 2017-02-11 MED ORDER — METHOCARBAMOL 500 MG PO TABS: 500.0000 mg | ORAL_TABLET | Freq: Four times a day (QID) | ORAL | 0 refills | Status: AC | PRN

## 2017-02-11 MED ORDER — NICOTINE 21 MG/24HR TD PT24: 21.0000 mg | MEDICATED_PATCH | Freq: Every day | TRANSDERMAL | 0 refills | Status: AC

## 2017-02-11 MED ORDER — AMOXICILLIN-POT CLAVULANATE 875-125 MG PO TABS: 1.0000 | ORAL_TABLET | Freq: Two times a day (BID) | ORAL | 0 refills | Status: DC

## 2017-02-11 NOTE — Progress Notes (Signed)
CSW c/s for SNF received, however pt to d/c home today, noted.  Pollyann SavoyJody Kendalynn Wideman, LCSW Weekend Coverage [phone]

## 2017-02-11 NOTE — Discharge Instructions (Addendum)
Diabetes Your medications for diabetes have changed. You will be discharged with Glipizide, a diabetes medication to take by mouth each day. You will also be discharged with Lantus 10 units to be taken daily.  Hip amputation Please go to your scheduled follow up appointment with Dr. Lajoyce Corners within the next week, this is the physician who can help with pain control going forward. Please continue your antibiotics as prescribed.  Blood Glucose Monitoring, Adult Monitoring your blood sugar (glucose) helps you manage your diabetes. It also helps you and your health care provider determine how well your diabetes management plan is working. Blood glucose monitoring involves checking your blood glucose as often as directed, and keeping a record (log) of your results over time. Why should I monitor my blood glucose? Checking your blood glucose regularly can:  Help you understand how food, exercise, illnesses, and medicines affect your blood glucose.  Let you know what your blood glucose is at any time. You can quickly tell if you are having low blood glucose (hypoglycemia) or high blood glucose (hyperglycemia).  Help you and your health care provider adjust your medicines as needed. When should I check my blood glucose? Follow instructions from your health care provider about how often to check your blood glucose. This may depend on:  The type of diabetes you have.  How well-controlled your diabetes is.  Medicines you are taking.   What is a blood glucose log?  A blood glucose log is a record of your blood glucose readings. It helps you and your health care provider:  Look for patterns in your blood glucose over time.  Adjust your diabetes management plan as needed.  Every time you check your blood glucose, write down your result and notes about things that may be affecting your blood glucose, such as your diet and exercise for the day.  Most glucose meters store a record of glucose readings  in the meter. Some meters allow you to download your records to a computer. How do I check my blood glucose? Follow these steps to get accurate readings of your blood glucose: Supplies needed   Blood glucose meter.  Test strips for your meter. Each meter has its own strips. You must use the strips that come with your meter.  A needle to prick your finger (lancet). Do not use lancets more than once.  A device that holds the lancet (lancing device).  A journal or log book to write down your results. Procedure  Wash your hands with soap and water.  Prick the side of your finger (not the tip) with the lancet. Use a different finger each time.  Gently rub the finger until a small drop of blood appears.  Follow instructions that come with your meter for inserting the test strip, applying blood to the strip, and using your blood glucose meter.  Write down your result and any notes. Alternative testing sites  Some meters allow you to use areas of your body other than your finger (alternative sites) to test your blood.  If you think you may have hypoglycemia, or if you have hypoglycemia unawareness, do not use alternative sites. Use your finger instead.  Alternative sites may not be as accurate as the fingers, because blood flow is slower in these areas. This means that the result you get may be delayed, and it may be different from the result that you would get from your finger.  The most common alternative sites are:  Forearm.  Thigh.  Palm of the hand. Additional tips  Always keep your supplies with you.  If you have questions or need help, all blood glucose meters have a 24-hour hotline number that you can call. You may also contact your health care provider.  After you use a few boxes of test strips, adjust (calibrate) your blood glucose meter by following instructions that came with your meter. This information is not intended to replace advice given to you by your  health care provider. Make sure you discuss any questions you have with your health care provider. Document Released: 12/07/2003 Document Revised: 06/23/2016 Document Reviewed: 05/15/2016 Elsevier Interactive Patient Education  2017 [employer]

## 2017-02-11 NOTE — Progress Notes (Signed)
Patient refused lab this am.  He stated that they took blood the day before yesterday and there is no reason to do labs everyday.  Checked in system.  His labs looked WNL.  Leaving note for staff as an FYI.

## 2017-02-11 NOTE — Discharge Summary (Signed)
[facility] Discharge Summary  Patient name: Derrick [name] Medical record number: [id number] Date of birth: Oct 09, 1971 Age: 46 y.o. Gender: male Date of Admission: 02/04/2017  Date of Discharge: 02/11/2017 Admitting Physician: Lupita Dawn, MD  Primary Care Provider: Pcp Not In System Consultants: Orthopedics  Indication for Hospitalization: Right AKA infection  Discharge Diagnoses/Problem List:  Patient Active Problem List   Diagnosis Date Noted  . Fever   . Wound infection 02/05/2017  . Post-operative pain   . Amputation stump infection ([place]) 02/04/2017  . Dehiscence of amputation stump ([place]) 01/30/2017  . Abscess of right leg   . Infection of below knee amputation stump ([place]) 01/22/2017  . Below knee amputation status, right ([place]) 01/06/2017  . Diabetic wet gangrene of the foot ([place])   . Diabetic foot infection ([place]) 12/13/2016  . HTN (hypertension) 12/13/2016  . Sepsis ([place]) 12/13/2016  . Hypokalemia 12/13/2016  . Chronic osteomyelitis involving ankle and foot ([place]) 06/03/2012  . Syncope and collapse 04/12/2012  . Tobacco abuse 04/12/2012  . Cellulitis 02/25/2012  . Diabetic toe ulcer ([place]) 02/25/2012  . Type 2 diabetes mellitus with complication, with long-term current use of insulin ([place]) 02/25/2012  . Leukocytosis 02/25/2012     Disposition: Home with home health PT/OT/RN  Discharge Condition: Stable  Discharge Exam:  Temp:  [97.7 F (36.5 C)-98.7 F (37.1 C)] 97.7 F (36.5 C) (02/25 0618) Pulse Rate:  [94-96] 95 (02/25 0618) Resp:  [18] 18 (02/24 1537) BP: (125-136)/(72-75) 132/74 (02/25 0618) SpO2:  [100 %] 100 % (02/25 0618) Physical Exam: General: NAD, rests comfortably in bed Cardiovascular: RRR, no m/r/g Respiratory: CTA bilaterally, no increased work of breathing Abdomen: soft, nontender, nondistended, normoactive BS Extremities: Left BKA. 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  R AKA Stump Infection - Patient was started on broad-spectrum antibiotics on admission, and narrowed to ceftriaxone which he received throughout his hospital stay. - On 2/19 the patient was taken for surgical I&D of right AKA by orthopedics. Infection was noted to be extensive at that time. Wound culture positive for GBS (S agalactiae) sensitive to ceftriaxone - On 2/21 the patient was taken back to the OR and infection was noted to extend up to his right hip, patient underwent disarticulation of the right hip. - Ceftriaxone was continued through 2/24, patient remained afebrile. Pain was controlled with morphine initially which was then de-escalated to Percocet. He remained afebrile with stable vital signs throughout his hospital course. - On 2/25 patient was discharged with 4 weeks of Augmentin 875-125 (through 03/11/2017).  Hyponatremia, persistent/stable - Sodium trend in the hospital was 133>129>130, unclear etiology. Patient refused labs prior to discharge. This can be rechecked at outpatient follow up.   Anemia, stable, in the setting of multiple surgical procedures this admission. - Hgb 9.5 at last check two days prior to discharge. Patient subsequently refused labs. CBC should be rechecked at hospital follow up.  Tobacco abuse - Patient was discharged with prescription for a nicotine patch  Diabetes - Poorly controlled, A1c 14.5 in 11/2016. Patient does not check blood sugars at home and does not like giving himself injections.  Patient did refuse insulin on multiple occasions while in the hospital.  Alger conversation with the patient led to the  following dispo plan: 15 units of Lantus daily, 2.5 mg of glipizide daily. - Patient was  interested in a glucometer that would not require multiple needle sticks. A Relion glucometer information was provided by case management prior to discharge.  Issues for Follow Up:  1. Hemoglobin was stable at 9.5 two days prior to discharge, after that he was refusing labs. Please follow up CBC at hospital follow up. 2. Potassium - Please recheck BMP for potassium, which was stable WNL at 4.0 two days prior to discharge. 3. Antibiotics - Patient discharged with 4 weeks of augmentin (through 3/252018). Probiotic also given. 1.  Initially this script was sent to the pharmacy as 6 weeks duration, however pharmacy was called the medication duration was adjusted over the phone prior to dispensing, so the patient will receive the appropriate number of pills when the Augmentin is filled. At follow up please ensure the patient only takes the medication for 4 weeks. 2. When I called the pharmacy the day after discharge, antibiotics had not been filled yet. I referred patient to Transitions of Care services at [facility] to help with patient medication compliance after discharge. 4. Diabetes - discharged with Lantus 15u and glipizide 2.5 mg daily. History of poor compliance, please go over these medications with him again at follow up.  5. Pain management - Patient to follow up with Dr. Sharol Given for pain management. He was prescribed 7 days of percocet at discharge. 6. Patient to follow-up at [facility] for a few visits (primarily for diabetes management), then plans to move to [place], [place].  Significant Procedures:  2/19 surgical I&D of right AKA by Ortho 2/21 R hip disarticulation  Significant Labs and Imaging:   Recent Labs Lab 02/07/17 0409 02/08/17 0600 02/09/17 0326  WBC 30.6* 25.0* 20.2*  HGB 7.0* 9.6* 9.5*  HCT 21.5* 27.8* 27.9*  PLT 638* 529* 567*    Recent Labs Lab 02/05/17 0752 02/06/17 0531 02/07/17 0409 02/08/17 0600 02/09/17 0326  NA 133* 130* 129* 129* 130*  K 3.0* 3.0* 2.8* 3.4*  4.0  CL 96* 94* 92* 94* 95*  CO2 _0 GLUCOSE 158* 295* 240* 169* 245*  BUN _1 5* <5*  CREATININE 0.65 0.71 0.70 0.51* 0.54*  CALCIUM 8.1* 7.8* 7.5* 7.5* 7.7*    Results/Tests Pending at Time of Discharge: None  Discharge Medications:  Allergies as of 02/11/2017      Reactions   Vancomycin Other (See Comments)   Acute kidney injury      Medication List    STOP taking these medications   ciprofloxacin 500 MG tablet Commonly known as:  CIPRO   doxycycline 100 MG tablet Commonly known as:  VIBRA-TABS   insulin aspart 100 UNIT/ML FlexPen Commonly known as:  NOVOLOG FLEXPEN   oxyCODONE-acetaminophen 5-325 MG tablet Commonly known as:  PERCOCET/ROXICET Replaced by:  oxyCODONE-acetaminophen 7.5-325 MG tablet     TAKE these medications   acetaminophen 325 MG tablet Commonly known as:  TYLENOL Take 2 tablets (650 mg total) by mouth every 6 (six) hours as needed for mild pain or fever (or Fever >/= 101). Acetaminophen dose from all sources not to exceed 4 g per day.   amoxicillin-clavulanate 875-125 MG tablet Commonly known as:  AUGMENTIN Take 1 tablet by mouth 2 (two) times daily.   blood glucose meter kit and supplies Kit Dispense based on patient and insurance preference. Use up to four times daily as directed. (FOR ICD-9 250.00, 250.01).  gabapentin 100 MG capsule Commonly known as:  NEURONTIN Take 1 capsule (100 mg total) by mouth 3 (three) times daily.   glipiZIDE 5 MG tablet Commonly known as:  GLUCOTROL Take 0.5 tablets (2.5 mg total) by mouth daily before breakfast.   Insulin Glargine 100 UNIT/ML Solostar Pen Commonly known as:  LANTUS SOLOSTAR Inject 15 Units into the skin daily at 10 pm. What changed:  how much to take   lisinopril 10 MG tablet Commonly known as:  PRINIVIL,ZESTRIL Take 1 tablet (10 mg total) by mouth daily.   methocarbamol 500 MG tablet Commonly known as:  ROBAXIN Take 1 tablet (500 mg total) by mouth every 6 (six)  hours as needed for muscle spasms.   nicotine 21 mg/24hr patch Commonly known as:  NICODERM CQ - dosed in mg/24 hours Place 1 patch (21 mg total) onto the skin daily. Start taking on:  02/12/2017   oxyCODONE-acetaminophen 7.5-325 MG tablet Commonly known as:  PERCOCET Take 1 tablet by mouth every 6 (six) hours as needed for severe pain. Replaces:  oxyCODONE-acetaminophen 5-325 MG tablet   Pen Needles 3/16" 31G X 5 MM Misc Use as directed 3 times daily with meals and at bedtime.   Probiotic 250 MG Caps Take 1 capsule by mouth daily.   promethazine 25 MG tablet Commonly known as:  PHENERGAN Take 25 mg by mouth every 6 (six) hours as needed for nausea or vomiting.       Discharge Instructions: Please refer to Patient Instructions section of EMR for full details.  Patient was counseled important signs and symptoms that should prompt return to medical care, changes in medications, dietary instructions, activity restrictions, and follow up appointments.   Follow-Up Appointments: Follow-up Information    Newt Minion, MD Follow up in 2 week(s).   Specialty:  Orthopedic Surgery Contact information: [place] [address] [phone]        [facility] Follow up.   Specialty:  Home Health Services Why:  Someone from [facility] will contact you to arrange start date and time for therapy.  Contact information: [address] [address] [phone]        ReLion Glucometer Follow up.   Why:  You can purchase this meter at [facility] for approximately 14.00 and it includes strips. Contact information: [facility]       Georges Lynch, MD. Daphane Shepherd on 02/26/2017.   Specialty:  Family Medicine Why:  11:30 AM. Please arrive 15 minutes prior to your appointment. Contact information: [address] [address] [phone]           Everrett Coombe, MD 02/11/2017, 10:59 PM PGY-1, [place]

## 2017-02-11 NOTE — Progress Notes (Signed)
CM received call from RN requesting PCP setup, financial help with medications, and glucometer information.   Pt has insurance and unfortunately does not meet criteria for charity medication (MATCH); CM addressed PCP issue with pt who is moving to [place]; CM has placed glucometer information on discharge instructions.  No other CM needs were communicated.

## 2017-02-11 NOTE — Progress Notes (Signed)
Patient refusing to stay and receive last dose of IV antibiotics and also requested to speak with Dr. Deirdre Priesthambliss before leaving, spoke with MD on call about patient's concern. Patient given discharge instructions, prescriptions and all questions answered. Patient stable at time of discharge.

## 2017-02-11 NOTE — Progress Notes (Signed)
Family Medicine Teaching Service Daily Progress Note Intern Pager: [phone]  Patient name: Derrick [name] Medical record number: [id number] Date of birth: [birth year] Age: 46 y.o. Gender: male  Primary Care Provider: Pcp Not In System Consultants: Orthopedics Code Status: DNR  Pt Overview and Major Events to Date:  2/18 admit to FPTS 2/19 Plan for surgical I&D of right AKA by Ortho 2/21 R hip disarticulation  Assessment and Plan: Derrick [name] is a 46 y.o. male presenting with right AKA pain, bleeding, and possible infection. PMH is significant for poorly controlled DM, smoker.   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 S/p I&D 2/19  by ortho with wound vac, repeat culture shows GBS (S agalactiae) sensitive to ceftriaxone. BCx NGTD.   - Post-op day #4: s/p R hip disarticulation (2/21) - Continue ceftriaxone >> will DC after 2/25 dose - Will f/u ortho recs - plan for DC Sunday. Recommend continue IV antibiotics with discharge Sunday morning with discharge on Augmentin for 4 weeks. Will also send with probiotic - Removed wound vac on 2/23. - pain control: percocet 7.5-325 q4H, ibuprofen 600 mg q6H prn, Robaxin 500mg  Q6hr PRN  - Increase gabapentin from 300mg  TID to 300mg  in AM and afternoon, 600mg  at bedtime. - HHPT/OT/aide/RN  DM2, uncontrolled A1c 14.5 on 11/2017. Does not take his home insulin. CBG currently in 300s. Rec'd 20u lantus, 28u novolog, and 2.5 mg glipizide over last 24h - Plan for DC with Lantus 15u daily - Patient does not want to take Metformin anymore (ever) due to GI side effects - moderate sliding scale - coupon provided for relion glucometer by case managment  Anemia, improved - . Hgb stable at 9.5 on 2/23 - patient refused labs today. Will need recheck at follow up.  Elevated Platelets, improved. (529 from 1001), possibly due to recent surgery and infection (acute phase reactant) vs iron  deficiency anemia - patient refused recheck labs today, Will need recheck at follow up.  Hypokalemia, improved: - K 4.0 on 2/23 - patient refused recheck labs today, Will need recheck at follow up.  Hyponatremia, persistent/stable:- trend 133 >129 >130; unclear etiology - patient refused recheck labs today  Tobacco Use: 2-3ppd - nicotine patch, will send with rx - continue to counsel cessation  FEN/GI: carb modified diet Prophylaxis: per ortho team  Disposition: Home with home health pending clinical improvement  Subjective:  Patient reports pain is 8/10 this AM, states he received pain meds a while ago. He is unhappy to be waiting for discharge today and would like to go as soon as possible. He is worried about being unable to afford his gabapentin at discharge. His sister at bedside would like information on needle-free glucometers.  Urinating normally, no N/V/D/C.    Objective: Temp:  [97.7 F (36.5 C)-98.7 F (37.1 C)] 97.7 F (36.5 C) (02/25 0618) Pulse Rate:  [94-96] 95 (02/25 0618) Resp:  [18] 18 (02/24 1537) BP: (125-136)/(72-75) 132/74 (02/25 0618) SpO2:  [100 %] 100 % (02/25 0618) Physical Exam: General: NAD, rests comfortably in bed Cardiovascular: RRR, no m/r/g Respiratory: CTA bilaterally, no increased work of breathing Abdomen: soft, nontender, nondistended, normoactive BS Extremities: Left BKA. 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  Howard PouchLauren Yehonatan Grandison, MD 02/11/2017, 7:15 AM PGY-1, [facility] FPTS Intern pager: [phone], text pages welcome

## 2017-02-11 NOTE — Progress Notes (Signed)
  Patient refused last dose of CTX prior to d/c home.  Dolores PattyAngela Tru Rana, DO PGY-1, [facility] 02/11/2017 11:18 AM

## 2017-02-11 NOTE — Progress Notes (Signed)
PT Cancellation Note  Patient Details Name: Derrick [name] MRN: [id number] DOB: 12/01/1971   Cancelled Treatment:    Reason Eval/Treat Not Completed: Patient declined, no reason specified. Pt reports that he is going home today and doesn't want a therapy visit. Answered any questions.    [name] PT, DPT  [phone]  02/11/2017, 9:41 AM

## 2017-02-12 ENCOUNTER — Telehealth: Payer: Self-pay | Admitting: Family Medicine

## 2017-02-12 NOTE — Telephone Encounter (Signed)
Dr. Mosetta PuttFeng would need to speak with pharmacy and with faulty provider about her DEA.  Please forward all message to RN Pool basket and not directly with sender.  Clovis PuMartin, Dorathy Stallone L, RN

## 2017-02-12 NOTE — Telephone Encounter (Signed)
It looks like patient has never established in our clinic. Is this correct? If he has, then I have never seen him in clinic.  Will forward to Dr. Mosetta PuttFeng regarding pain medication.  Will need to be seen in clinic by me if I am to write any orders (as I do not know what I am writing for).

## 2017-02-12 NOTE — Telephone Encounter (Signed)
Pt was unable to get percocet refilled because the dea number on script was wrong, it was written by Dr. Mosetta PuttFeng. Pt would like to get this filled as soon as possible. Pt uses [facility] on [address] in [place]. Pt was told he would have nurse visits once he got home, the only orders they have are for pt. Pt needs nurse visits. Couldn't understand the lady on the phone, she stated the name of the home health agency was inter something. ep

## 2017-02-13 ENCOUNTER — Other Ambulatory Visit: Payer: Self-pay | Admitting: Family Medicine

## 2017-02-13 ENCOUNTER — Other Ambulatory Visit: Payer: Self-pay | Admitting: Student in an Organized Health Care Education/Training Program

## 2017-02-13 ENCOUNTER — Telehealth: Payer: Self-pay | Admitting: Family Medicine

## 2017-02-13 MED ORDER — INSULIN NPH (HUMAN) (ISOPHANE) 100 UNIT/ML ~~LOC~~ SUSP: SUBCUTANEOUS | 3 refills | Status: AC

## 2017-02-13 NOTE — Telephone Encounter (Signed)
Received message that patient cannot afford his Lantus ($300) and did not fill it.  Insulin Called [facility] in [place] ([address]) - ordered NPH 70/30, patient to take 7 units BID.   - [facility] -  Also called in diabetes supplies.   - Discontinued Lantus 15u Qd.  Percocet The patient had also had difficulty filling his percocet prescription at his pharmacy.  [facility] stated that a resident DEA# will be acceptable and so patient should have no problem filling percocet there.  I called and spoke with the patient to relay this information to him. He voiced understanding on how to take the insulin and stated his girlfriend or his sister will go to [facility] to fill these prescriptions.  He stated that he did pick up his antibiotics and has been taking them as prescribed.

## 2017-02-13 NOTE — Telephone Encounter (Signed)
Will forward to provider that wrote the prescriptions.  Clovis PuMartin, Roman Sandall L, RN

## 2017-02-13 NOTE — Telephone Encounter (Signed)
Girlfriend is calling because the percocet that was prescribed can not be filled because they nheed a faculty doctor to sign this. Also the insulin is over 300.00 and he can not afford this at all. Can we find another solution for the insulin that would be cheaper for him. Please get these sent and fixed today so that they can pick these up. jw

## 2017-02-13 NOTE — Telephone Encounter (Signed)
Pt needs a refill on test strips for A Rely On Prime monitor. Pharm on file is correct. ep

## 2017-02-13 NOTE — Progress Notes (Signed)
Received message that patient cannot afford his Lantus ($300) and did not fill it.  Insulin Called [facility] in [place] (on [address]) - ordered NPH 70/30, patient to take 7 units BID.   - This medication will cost about $25 at [facility]. -  Also called in diabetes supplies.   - Discontinued Lantus 15u Qd.  Percocet The patient had also had difficulty filling his percocet prescription at his pharmacy.  The pharmacist at [facility] stated that a resident DEA# will be acceptable and so patient should have no problem filling percocet there.  I called and spoke with the patient to relay this information to him. He voiced understanding on how to take the insulin and stated his girlfriend or his [facility].  He stated that he did pick up his antibiotics and has been taking them as prescribed. 

## 2017-02-13 NOTE — Telephone Encounter (Signed)
Will forward to PCP.  Martin, Tamika L, RN  

## 2017-02-14 ENCOUNTER — Other Ambulatory Visit: Payer: Self-pay | Admitting: Family Medicine

## 2017-02-14 ENCOUNTER — Encounter (INDEPENDENT_AMBULATORY_CARE_PROVIDER_SITE_OTHER): Payer: Self-pay | Admitting: Orthopedic Surgery

## 2017-02-14 ENCOUNTER — Encounter: Payer: Self-pay | Admitting: *Deleted

## 2017-02-14 ENCOUNTER — Telehealth: Payer: Self-pay | Admitting: *Deleted

## 2017-02-14 ENCOUNTER — Ambulatory Visit (INDEPENDENT_AMBULATORY_CARE_PROVIDER_SITE_OTHER): Payer: Medicare Other | Admitting: Orthopedic Surgery

## 2017-02-14 DIAGNOSIS — Z89621 Acquired absence of right hip joint: Secondary | ICD-10-CM

## 2017-02-14 MED ORDER — GLUCOSE BLOOD VI STRP: ORAL_STRIP | 12 refills | Status: AC

## 2017-02-14 NOTE — Progress Notes (Signed)
Office Visit Note   Patient: Derrick [name]           Date of Birth: 01-02-1971           MRN: [id number] Visit Date: 02/14/2017              Requested by: Kathee Delton, MD [address] [place], [place] [address] PCP: Mickie Hillier, MD  Chief Complaint  Patient presents with  . Right Hip - Routine Post Op    02/07/17 Right Hip disarticulation. ~7 days post op.    HPI: Patient is 46 y.o male who is 7 days post op right hip disarticulation. Home health nursing called office today. Patients blood sugar 298. He does not want to take his insulin, fearful if insulin is taking his blood sugar will drop too low when he is home alone. There is pus drainage from hip disarticulation. He is nonweightbearing with wheelchair. Donalee Citrin, RT    Assessment & Plan: Visit Diagnoses:  1. History of disarticulation of right hip     Plan: Patient reports drainage with concern for possible infection due to patient having episodes of fever and chills. We will plan for irrigation and debridement of the hip disarticulation on Friday. If there is any suggestion of a persistent infection we will place an infusion wound VAC and then reevaluate with surgery on Wednesday. Patient would be inpatient during the infusion wound VAC treatment.  Follow-Up Instructions: Return in about 2 weeks (around 02/28/2017).   Ortho Exam On examination patient has symptoms consistent with infection with reports of chills and fevers he does not have a temperature at this time. Examination the incision is clean and dry there is no redness or cellulitis there is no purulent drainage. There is some clear serosanguineous drainage on the dressing.  Imaging: No results found.  Orders:  No orders of the defined types were placed in this encounter.  No orders of the defined types were placed in this encounter.    Procedures: No procedures performed  Clinical Data: No additional findings.  Subjective: Review of  Systems  Objective: Vital Signs: There were no vitals taken for this visit.  Specialty Comments:  No specialty comments available.  PMFS History: Patient Active Problem List   Diagnosis Date Noted  . History of disarticulation of right hip 02/14/2017  . Fever   . Wound infection 02/05/2017  . Post-operative pain   . Amputation stump infection (HCC) 02/04/2017  . Dehiscence of amputation stump (HCC) 01/30/2017  . Abscess of right leg   . 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 Below knee amputation status, right (HCC) 01/06/2017  . Diabetic wet gangrene of the foot (HCC)   . Diabetic foot infection (HCC) 12/13/2016  . HTN (hypertension) 12/13/2016  . Sepsis (HCC) 12/13/2016  . Hypokalemia 12/13/2016  . Chronic osteomyelitis involving ankle and foot (HCC) 06/03/2012  . Syncope and collapse 04/12/2012  . Tobacco abuse 04/12/2012  . Cellulitis 02/25/2012  . Diabetic toe ulcer (HCC) 02/25/2012  . Type 2 diabetes mellitus with complication, with long-term current use of insulin (HCC) 02/25/2012  . Leukocytosis 02/25/2012   Past Medical History:  Diagnosis Date  . Anemia   . Arthritis   . Diabetes mellitus    Type II  . GERD (gastroesophageal reflux disease)   . Neuropathy (HCC)   . Peripheral neuropathy (HCC)   . Pneumonia   . Prostatitis     Family History  Problem Relation  Age of Onset  . Cancer Mother   . Congestive Heart Failure Father     Past Surgical History:  Procedure Laterality Date  . AMPUTATION  05/31/2012   Procedure: AMPUTATION DIGIT;  Surgeon: Nadara MustardMarcus V Natanael Saladin, MD;  [facility];  Service: Orthopedics;  Laterality: Right;  right fourth and fifth toe ray amputation   . AMPUTATION Right 12/14/2016   Procedure: AMPUTATION BELOW KNEE RIGHT;  Surgeon: Nadara MustardMarcus Juancarlos Crescenzo V, MD;  Location: [facility] OR;  Service: Orthopedics;  Laterality: Right;  . AMPUTATION Right 01/31/2017   Procedure: Right Above Knee Amputation;  Surgeon: Nadara MustardMarcus Raiya Stainback V, MD;   [facility];  Service: Orthopedics;  Laterality: Right;  . APPLICATION OF WOUND VAC Right 02/05/2017   Procedure: APPLICATION OF WOUND VAC;  Surgeon: Nadara MustardMarcus Abdulai Blaylock V, MD;  Location: [facility] OR;  Service: Orthopedics;  Laterality: Right;  . I&D EXTREMITY  08/02/2012   Procedure: IRRIGATION AND DEBRIDEMENT EXTREMITY;  Surgeon: Nadara MustardMarcus V Justn Quale, MD;  Location: [facility] OR;  Service: Orthopedics;  Laterality: Right;  Right Foot Irrigation and Debridement, Place Antibiotic Beads  . SHOULDER SURGERY Bilateral    rotarcuff  . STUMP REVISION Left 07/12/2016   Procedure: Revision Left Below Knee Amputation;  Surgeon: Nadara MustardMarcus V Jahne Krukowski, MD;  [facility];  Service: Orthopedics;  Laterality: Left;  . STUMP REVISION Right 01/24/2017   Procedure: Revision Right Below Knee Amputation;  Surgeon: Nadara MustardMarcus Darren Caldron V, MD;  [facility];  Service: Orthopedics;  Laterality: Right;  . STUMP REVISION Right 02/05/2017   Procedure: REVISION RIGHT AKA;  Surgeon: Nadara MustardMarcus Ahmaad Neidhardt V, MD;  [facility];  Service: Orthopedics;  Laterality: Right;  . STUMP REVISION Right 02/07/2017   Procedure: RIGHT HIP DISARTICULATION;  Surgeon: Nadara MustardMarcus Catia Todorov V, MD;  Location: [facility] OR;  Service: Orthopedics;  Laterality: Right;  . TOE AMPUTATION     multiple toes amputated.   Social History   Occupational History  . Not on file.   Social History Main Topics  . Smoking status: Current Every Day Smoker    Packs/day: 2.00    Years: 24.00    Types: Cigarettes  . Smokeless tobacco: Never Used  . Alcohol use No  . Drug use: No  . Sexual activity: Yes    Birth control/ protection: None

## 2017-02-14 NOTE — Telephone Encounter (Signed)
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 Patient is caring for self at home.  X Patient has caregiver. If so, name and best contact number: Derrick ClaymanMelissa [name] [phone]) ? Patient is knowledgeable of his/her condition(s) and/or treatment.  ? Family and/or caregiver is knowledgeable of patient's condition(s) and/or treatment.   Medication Reconciliation:  X Medication list reviewed with caregiver.  X Derrick states they now have all discharge meds. Picked up percocet last evening. Taking Augmentin bid, glipizide daily with breakfast. Took NPH 70/30 7 units last evening with meal but did not take this AM. Patient is afraid to take the insulin [facility] Der[name]. Derrick [name] reviewed with patient and Derrick s/sx of hypoglycemia and how to treat in detail. Derrick states they do not have the nicotine patch but reports patient is trying to quit smoking.  ? Patient has O2/CPAP ordered? If so, name of company that supplies:  Activities of Daily Living:  ? Independent  X Needs assist (describe) Derrick assists with bathing, dressing, meal prep ? [facility] (describe)   Community resources in place for patient:  ? None  X Home Health If so, name of agency: [facility] PT and RN ? [facility] If so, name of Care Manager and contact number:   ? Assisted Living  ? Hospice  ? Support Group    Topics  discussed:  Patient lives with girlfriend and 3 dogs in one level home with ramp for w/c access.  Teen and adult children are "in and out."  Support System: Derrick ClaymanMelissa [name] (Girlfriend/Caregiver)  Home DME: Ordered at discharge? Does patient have? N/A  Transportation: Barriers? Derrick KaufmannMelissa has car and drives patient to appts as work permits. States patient is able to transfer in and out of car.  Food/Nutrition: (ability to afford, access, use of any community resources) Denies food insecurity  Would patient benefit from consult with Social Work? Behavioral Health? Pharm D? Perhaps BH for med non-adherence and recent disarticulation of right hip.  Identified Barriers: Patient non-adherence to DM management (checking BS and administering insulin)  Topics Discussed: Patient states, "I think I'm heading back to hospital because my right hip looks worse." States PT is with him now and this was their assessment. Asked to speak to PT. Home Health Nurse, Derrick [name] was with patient and not PT. Derrick [name] states there is a little redness around suture with one area looking "puffy". States area looks a little more red than it did 2 days ago. Denies warmth or purulent drainage. Patient is afebrile CBG is currently 298. Patient did not take this morning's dose of NPH 70/30 as noted above. Derrick [name] reinforced compliance with checking BS and taking insulin. Derrick [name] did not feel patient needed to go to ED at this time but to be sure to go to OV on 02/16/17.   Reminded Derrick of TOC appt on 02/16/2017 at 1015 with Dr.McKeag.   Patient denies any questions or concerns at this time.          Will route note  to Dr. Pascal [name] for possible [facility] services.  Derrick Feil, RN, BSN

## 2017-02-14 NOTE — Telephone Encounter (Signed)
Refill sent.

## 2017-02-14 NOTE — Telephone Encounter (Signed)
Attempted to call pt, no voicemail set up. Will try again tomorrow. Tushar Enns Bruna PotterBlount, CMA

## 2017-02-15 ENCOUNTER — Other Ambulatory Visit (INDEPENDENT_AMBULATORY_CARE_PROVIDER_SITE_OTHER): Payer: Self-pay | Admitting: Orthopedic Surgery

## 2017-02-15 DIAGNOSIS — T8140XD Infection following a procedure, unspecified, subsequent encounter: Secondary | ICD-10-CM

## 2017-02-15 NOTE — Progress Notes (Signed)
Derrick [name] reports that this CBG runs around 200.  I began to give patient instruction on what to do if CBG , 70, patient said it will not be and did not want to hear instructions.

## 2017-02-16 ENCOUNTER — Encounter (HOSPITAL_COMMUNITY)
Admission: RE | Disposition: A | Discharge status: Home Health | Payer: Self-pay | Source: Ambulatory Visit | Attending: Orthopedic Surgery

## 2017-02-16 ENCOUNTER — Inpatient Hospital Stay: Payer: Medicare Other | Admitting: Family Medicine

## 2017-02-16 ENCOUNTER — Inpatient Hospital Stay (HOSPITAL_COMMUNITY)
Admission: RE | Admit: 2017-02-16 | Discharge: 2017-02-26 | DRG: 500 | Disposition: A | Discharge status: Home Health | Payer: Medicare Other | Source: Ambulatory Visit | Attending: Orthopedic Surgery | Admitting: Orthopedic Surgery

## 2017-02-16 ENCOUNTER — Encounter (HOSPITAL_COMMUNITY): Payer: Self-pay | Admitting: *Deleted

## 2017-02-16 ENCOUNTER — Inpatient Hospital Stay (HOSPITAL_COMMUNITY): Payer: Medicare Other | Admitting: Anesthesiology

## 2017-02-16 DIAGNOSIS — E871 Hypo-osmolality and hyponatremia: Secondary | ICD-10-CM | POA: Diagnosis present

## 2017-02-16 DIAGNOSIS — K219 Gastro-esophageal reflux disease without esophagitis: Secondary | ICD-10-CM | POA: Diagnosis present

## 2017-02-16 DIAGNOSIS — Z720 Tobacco use: Secondary | ICD-10-CM | POA: Diagnosis present

## 2017-02-16 DIAGNOSIS — I10 Essential (primary) hypertension: Secondary | ICD-10-CM | POA: Diagnosis present

## 2017-02-16 DIAGNOSIS — E1142 Type 2 diabetes mellitus with diabetic polyneuropathy: Secondary | ICD-10-CM | POA: Diagnosis present

## 2017-02-16 DIAGNOSIS — T814XXD Infection following a procedure, subsequent encounter: Secondary | ICD-10-CM | POA: Diagnosis not present

## 2017-02-16 DIAGNOSIS — Z89512 Acquired absence of left leg below knee: Secondary | ICD-10-CM

## 2017-02-16 DIAGNOSIS — T8743 Infection of amputation stump, right lower extremity: Principal | ICD-10-CM | POA: Diagnosis present

## 2017-02-16 DIAGNOSIS — L02415 Cutaneous abscess of right lower limb: Secondary | ICD-10-CM | POA: Diagnosis present

## 2017-02-16 DIAGNOSIS — R6521 Severe sepsis with septic shock: Secondary | ICD-10-CM | POA: Diagnosis present

## 2017-02-16 DIAGNOSIS — F17210 Nicotine dependence, cigarettes, uncomplicated: Secondary | ICD-10-CM | POA: Diagnosis present

## 2017-02-16 DIAGNOSIS — Z794 Long term (current) use of insulin: Secondary | ICD-10-CM

## 2017-02-16 DIAGNOSIS — E876 Hypokalemia: Secondary | ICD-10-CM | POA: Diagnosis not present

## 2017-02-16 DIAGNOSIS — F419 Anxiety disorder, unspecified: Secondary | ICD-10-CM | POA: Diagnosis present

## 2017-02-16 DIAGNOSIS — Z79899 Other long term (current) drug therapy: Secondary | ICD-10-CM

## 2017-02-16 DIAGNOSIS — M1990 Unspecified osteoarthritis, unspecified site: Secondary | ICD-10-CM | POA: Diagnosis present

## 2017-02-16 DIAGNOSIS — Z89621 Acquired absence of right hip joint: Secondary | ICD-10-CM | POA: Diagnosis not present

## 2017-02-16 DIAGNOSIS — A419 Sepsis, unspecified organism: Secondary | ICD-10-CM | POA: Diagnosis present

## 2017-02-16 DIAGNOSIS — E1165 Type 2 diabetes mellitus with hyperglycemia: Secondary | ICD-10-CM | POA: Diagnosis present

## 2017-02-16 DIAGNOSIS — T8140XA Infection following a procedure, unspecified, initial encounter: Secondary | ICD-10-CM

## 2017-02-16 DIAGNOSIS — R5381 Other malaise: Secondary | ICD-10-CM | POA: Diagnosis not present

## 2017-02-16 DIAGNOSIS — T8140XD Infection following a procedure, unspecified, subsequent encounter: Secondary | ICD-10-CM

## 2017-02-16 DIAGNOSIS — Y835 Amputation of limb(s) as the cause of abnormal reaction of the patient, or of later complication, without mention of misadventure at the time of the procedure: Secondary | ICD-10-CM | POA: Diagnosis present

## 2017-02-16 DIAGNOSIS — E118 Type 2 diabetes mellitus with unspecified complications: Secondary | ICD-10-CM | POA: Diagnosis not present

## 2017-02-16 DIAGNOSIS — M25551 Pain in right hip: Secondary | ICD-10-CM | POA: Diagnosis present

## 2017-02-16 HISTORY — PX: I & D EXTREMITY: SHX5045

## 2017-02-16 LAB — BASIC METABOLIC PANEL
ANION GAP: 9 (ref 5–15)
BUN: 11 mg/dL (ref 6–20)
CO2: 27 mmol/L (ref 22–32)
Calcium: 8.9 mg/dL (ref 8.9–10.3)
Chloride: 96 mmol/L — ABNORMAL LOW (ref 101–111)
Creatinine, Ser: 0.67 mg/dL (ref 0.61–1.24)
GFR calc Af Amer: >60 mL/min (ref >60)
Glucose, Bld: 181 mg/dL — ABNORMAL HIGH (ref 65–99)
POTASSIUM: 4 mmol/L (ref 3.5–5.1)
SODIUM: 132 mmol/L — AB (ref 135–145)

## 2017-02-16 LAB — CBC
HCT: 30.8 % — ABNORMAL LOW (ref 39.0–52.0)
Hemoglobin: 10.3 g/dL — ABNORMAL LOW (ref 13.0–17.0)
MCH: 27.5 pg (ref 26.0–34.0)
MCHC: 33.4 g/dL (ref 30.0–36.0)
MCV: 82.4 fL (ref 78.0–100.0)
Platelets: 652 10*3/uL — ABNORMAL HIGH (ref 150–400)
RBC: 3.74 MIL/uL — AB (ref 4.22–5.81)
RDW: 16 % — ABNORMAL HIGH (ref 11.5–15.5)
WBC: 16.6 10*3/uL — ABNORMAL HIGH (ref 4.0–10.5)

## 2017-02-16 LAB — GLUCOSE, CAPILLARY
GLUCOSE-CAPILLARY: 175 mg/dL — AB (ref 65–99)
Glucose-Capillary: 151 mg/dL — ABNORMAL HIGH (ref 65–99)
Glucose-Capillary: 335 mg/dL — ABNORMAL HIGH (ref 65–99)

## 2017-02-16 SURGERY — IRRIGATION AND DEBRIDEMENT EXTREMITY
Anesthesia: General | Laterality: Right

## 2017-02-16 MED ORDER — CEFAZOLIN SODIUM-DEXTROSE 2-4 GM/100ML-% IV SOLN
2.0000 g | INTRAVENOUS | Status: AC
  Administered 2017-02-16: 2 g via INTRAVENOUS
  Filled 2017-02-16: qty 100

## 2017-02-16 MED ORDER — NICOTINE 21 MG/24HR TD PT24
21.0000 mg | MEDICATED_PATCH | Freq: Every day | TRANSDERMAL | Status: DC
  Administered 2017-02-16 – 2017-02-25 (×10): 21 mg via TRANSDERMAL
  Filled 2017-02-16 (×11): qty 1

## 2017-02-16 MED ORDER — GABAPENTIN 300 MG PO CAPS
300.0000 mg | ORAL_CAPSULE | Freq: Three times a day (TID) | ORAL | Status: DC
  Administered 2017-02-16 – 2017-02-26 (×29): 300 mg via ORAL
  Filled 2017-02-16 (×29): qty 1

## 2017-02-16 MED ORDER — ACETAMINOPHEN 325 MG PO TABS
650.0000 mg | ORAL_TABLET | Freq: Four times a day (QID) | ORAL | Status: DC | PRN
  Administered 2017-02-19 – 2017-02-23 (×2): 650 mg via ORAL
  Filled 2017-02-16 (×2): qty 2

## 2017-02-16 MED ORDER — PROPOFOL 10 MG/ML IV BOLUS
INTRAVENOUS | Status: AC
  Filled 2017-02-16: qty 20

## 2017-02-16 MED ORDER — ONDANSETRON HCL 4 MG/2ML IJ SOLN: 4.0000 mg | Freq: Four times a day (QID) | INTRAMUSCULAR | Status: DC | PRN

## 2017-02-16 MED ORDER — SODIUM CHLORIDE 0.9 % IR SOLN
Status: DC | PRN
  Administered 2017-02-16: 3000 mL

## 2017-02-16 MED ORDER — OXYCODONE HCL 5 MG PO TABS: 5.0000 mg | ORAL_TABLET | Freq: Once | ORAL | Status: DC | PRN

## 2017-02-16 MED ORDER — FENTANYL CITRATE (PF) 100 MCG/2ML IJ SOLN
INTRAMUSCULAR | Status: DC | PRN
  Administered 2017-02-16: 100 ug via INTRAVENOUS

## 2017-02-16 MED ORDER — ONDANSETRON HCL 4 MG PO TABS: 4.0000 mg | ORAL_TABLET | Freq: Four times a day (QID) | ORAL | Status: DC | PRN

## 2017-02-16 MED ORDER — INSULIN GLARGINE 100 UNIT/ML ~~LOC~~ SOLN
10.0000 [IU] | Freq: Every day | SUBCUTANEOUS | Status: DC
  Administered 2017-02-16 – 2017-02-19 (×4): 10 [IU] via SUBCUTANEOUS
  Filled 2017-02-16 (×4): qty 0.1

## 2017-02-16 MED ORDER — MAGNESIUM CITRATE PO SOLN: 1.0000 | Freq: Once | ORAL | Status: DC | PRN

## 2017-02-16 MED ORDER — MIDAZOLAM HCL 2 MG/2ML IJ SOLN
INTRAMUSCULAR | Status: AC
  Filled 2017-02-16: qty 2

## 2017-02-16 MED ORDER — METOCLOPRAMIDE HCL 5 MG PO TABS: 5.0000 mg | ORAL_TABLET | Freq: Three times a day (TID) | ORAL | Status: DC | PRN

## 2017-02-16 MED ORDER — GLIPIZIDE 5 MG PO TABS
2.5000 mg | ORAL_TABLET | Freq: Every day | ORAL | Status: DC
  Administered 2017-02-17 – 2017-02-20 (×4): 2.5 mg via ORAL
  Filled 2017-02-16 (×4): qty 1

## 2017-02-16 MED ORDER — LIDOCAINE HCL (CARDIAC) 20 MG/ML IV SOLN
INTRAVENOUS | Status: DC | PRN
  Administered 2017-02-16: 60 mg via INTRAVENOUS

## 2017-02-16 MED ORDER — OXYCODONE HCL 5 MG/5ML PO SOLN: 5.0000 mg | Freq: Once | ORAL | Status: DC | PRN

## 2017-02-16 MED ORDER — PHENYLEPHRINE 40 MCG/ML (10ML) SYRINGE FOR IV PUSH (FOR BLOOD PRESSURE SUPPORT)
PREFILLED_SYRINGE | INTRAVENOUS | Status: AC
  Filled 2017-02-16: qty 10

## 2017-02-16 MED ORDER — INSULIN ASPART 100 UNIT/ML ~~LOC~~ SOLN
4.0000 [IU] | Freq: Three times a day (TID) | SUBCUTANEOUS | Status: DC
  Administered 2017-02-18 – 2017-02-20 (×6): 4 [IU] via SUBCUTANEOUS

## 2017-02-16 MED ORDER — PROPOFOL 10 MG/ML IV BOLUS
INTRAVENOUS | Status: DC | PRN
  Administered 2017-02-16: 180 mg via INTRAVENOUS

## 2017-02-16 MED ORDER — MIDAZOLAM HCL 2 MG/2ML IJ SOLN
INTRAMUSCULAR | Status: DC | PRN
  Administered 2017-02-16: 2 mg via INTRAVENOUS

## 2017-02-16 MED ORDER — MIDAZOLAM HCL 2 MG/2ML IJ SOLN
INTRAMUSCULAR | Status: AC
  Administered 2017-02-16: 1 mg via INTRAVENOUS
  Filled 2017-02-16: qty 2

## 2017-02-16 MED ORDER — ACETAMINOPHEN 650 MG RE SUPP: 650.0000 mg | Freq: Four times a day (QID) | RECTAL | Status: DC | PRN

## 2017-02-16 MED ORDER — EPHEDRINE SULFATE 50 MG/ML IJ SOLN
INTRAMUSCULAR | Status: DC | PRN
  Administered 2017-02-16: 15 mg via INTRAVENOUS

## 2017-02-16 MED ORDER — CHLORHEXIDINE GLUCONATE 4 % EX LIQD: 60.0000 mL | Freq: Once | CUTANEOUS | Status: DC

## 2017-02-16 MED ORDER — EPHEDRINE 5 MG/ML INJ
INTRAVENOUS | Status: AC
  Filled 2017-02-16: qty 10

## 2017-02-16 MED ORDER — PHENYLEPHRINE HCL 10 MG/ML IJ SOLN
INTRAMUSCULAR | Status: DC | PRN
  Administered 2017-02-16: 120 ug via INTRAVENOUS
  Administered 2017-02-16: 160 ug via INTRAVENOUS
  Administered 2017-02-16: 80 ug via INTRAVENOUS
  Administered 2017-02-16: 120 ug via INTRAVENOUS
  Administered 2017-02-16: 200 ug via INTRAVENOUS
  Administered 2017-02-16: 120 ug via INTRAVENOUS
  Administered 2017-02-16 (×2): 200 ug via INTRAVENOUS

## 2017-02-16 MED ORDER — ONDANSETRON HCL 4 MG/2ML IJ SOLN
INTRAMUSCULAR | Status: AC
  Filled 2017-02-16: qty 2

## 2017-02-16 MED ORDER — DIPHENHYDRAMINE HCL 12.5 MG/5ML PO ELIX
12.5000 mg | ORAL_SOLUTION | ORAL | Status: DC | PRN
  Administered 2017-02-18: 25 mg via ORAL
  Filled 2017-02-16 (×2): qty 10

## 2017-02-16 MED ORDER — SODIUM CHLORIDE 0.9 % IV SOLN
2000.0000 mg | INTRAVENOUS | Status: AC
  Administered 2017-02-16: 2000 mg via TOPICAL
  Filled 2017-02-16: qty 20

## 2017-02-16 MED ORDER — LISINOPRIL 10 MG PO TABS
10.0000 mg | ORAL_TABLET | Freq: Every day | ORAL | Status: DC
  Administered 2017-02-18: 10 mg via ORAL
  Filled 2017-02-16 (×4): qty 1

## 2017-02-16 MED ORDER — DEXMEDETOMIDINE HCL IN NACL 200 MCG/50ML IV SOLN
INTRAVENOUS | Status: AC
  Filled 2017-02-16: qty 50

## 2017-02-16 MED ORDER — DOCUSATE SODIUM 100 MG PO CAPS
100.0000 mg | ORAL_CAPSULE | Freq: Two times a day (BID) | ORAL | Status: DC
  Administered 2017-02-17 – 2017-02-23 (×8): 100 mg via ORAL
  Filled 2017-02-16 (×14): qty 1

## 2017-02-16 MED ORDER — FENTANYL CITRATE (PF) 100 MCG/2ML IJ SOLN
INTRAMUSCULAR | Status: AC
  Filled 2017-02-16: qty 2

## 2017-02-16 MED ORDER — MIDAZOLAM HCL 2 MG/2ML IJ SOLN
1.0000 mg | Freq: Once | INTRAMUSCULAR | Status: AC
  Administered 2017-02-16: 1 mg via INTRAVENOUS

## 2017-02-16 MED ORDER — 0.9 % SODIUM CHLORIDE (POUR BTL) OPTIME
TOPICAL | Status: DC | PRN
  Administered 2017-02-16: 1000 mL

## 2017-02-16 MED ORDER — LACTATED RINGERS IV SOLN
INTRAVENOUS | Status: DC
  Administered 2017-02-16 (×2): via INTRAVENOUS

## 2017-02-16 MED ORDER — ONDANSETRON HCL 4 MG/2ML IJ SOLN
4.0000 mg | Freq: Once | INTRAMUSCULAR | Status: DC | PRN
Start: 2017-02-16 — End: 2017-02-16

## 2017-02-16 MED ORDER — CEFAZOLIN IN D5W 1 GM/50ML IV SOLN
1.0000 g | Freq: Three times a day (TID) | INTRAVENOUS | Status: DC
  Administered 2017-02-16 – 2017-02-19 (×9): 1 g via INTRAVENOUS
  Filled 2017-02-16 (×10): qty 50

## 2017-02-16 MED ORDER — POLYETHYLENE GLYCOL 3350 17 G PO PACK: 17.0000 g | PACK | Freq: Every day | ORAL | Status: DC | PRN

## 2017-02-16 MED ORDER — DEXMEDETOMIDINE HCL IN NACL 200 MCG/50ML IV SOLN
INTRAVENOUS | Status: DC | PRN
  Administered 2017-02-16: 8 ug via INTRAVENOUS

## 2017-02-16 MED ORDER — INSULIN ASPART 100 UNIT/ML ~~LOC~~ SOLN
0.0000 [IU] | Freq: Three times a day (TID) | SUBCUTANEOUS | Status: DC
  Administered 2017-02-17 – 2017-02-18 (×2): 2 [IU] via SUBCUTANEOUS
  Administered 2017-02-18: 8 [IU] via SUBCUTANEOUS
  Administered 2017-02-19: 5 [IU] via SUBCUTANEOUS
  Administered 2017-02-19: 3 [IU] via SUBCUTANEOUS
  Administered 2017-02-19: 2 [IU] via SUBCUTANEOUS
  Administered 2017-02-20: 5 [IU] via SUBCUTANEOUS

## 2017-02-16 MED ORDER — METHOCARBAMOL 1000 MG/10ML IJ SOLN
500.0000 mg | Freq: Four times a day (QID) | INTRAVENOUS | Status: DC | PRN
  Filled 2017-02-16: qty 5

## 2017-02-16 MED ORDER — ONDANSETRON HCL 4 MG/2ML IJ SOLN
INTRAMUSCULAR | Status: DC | PRN
  Administered 2017-02-16: 4 mg via INTRAVENOUS

## 2017-02-16 MED ORDER — METHOCARBAMOL 500 MG PO TABS
500.0000 mg | ORAL_TABLET | Freq: Four times a day (QID) | ORAL | Status: DC | PRN
  Administered 2017-02-16 – 2017-02-26 (×20): 500 mg via ORAL
  Filled 2017-02-16 (×24): qty 1

## 2017-02-16 MED ORDER — ROCURONIUM BROMIDE 50 MG/5ML IV SOSY
PREFILLED_SYRINGE | INTRAVENOUS | Status: AC
  Filled 2017-02-16: qty 5

## 2017-02-16 MED ORDER — METOCLOPRAMIDE HCL 5 MG/ML IJ SOLN: 5.0000 mg | Freq: Three times a day (TID) | INTRAMUSCULAR | Status: DC | PRN

## 2017-02-16 MED ORDER — LIDOCAINE 2% (20 MG/ML) 5 ML SYRINGE
INTRAMUSCULAR | Status: AC
  Filled 2017-02-16: qty 5

## 2017-02-16 MED ORDER — MORPHINE SULFATE (PF) 4 MG/ML IV SOLN
4.0000 mg | INTRAVENOUS | Status: DC | PRN
  Administered 2017-02-16 – 2017-02-25 (×58): 4 mg via INTRAVENOUS
  Filled 2017-02-16 (×59): qty 1

## 2017-02-16 MED ORDER — SODIUM CHLORIDE 0.9 % IV SOLN
INTRAVENOUS | Status: DC
  Administered 2017-02-18: 10 mL/h via INTRAVENOUS
  Administered 2017-02-19: 21:00:00 via INTRAVENOUS

## 2017-02-16 MED ORDER — BISACODYL 10 MG RE SUPP: 10.0000 mg | Freq: Every day | RECTAL | Status: DC | PRN

## 2017-02-16 MED ORDER — FENTANYL CITRATE (PF) 100 MCG/2ML IJ SOLN
25.0000 ug | INTRAMUSCULAR | Status: DC | PRN
  Administered 2017-02-16: 25 ug via INTRAVENOUS
  Administered 2017-02-16: 50 ug via INTRAVENOUS

## 2017-02-16 MED ORDER — OXYCODONE HCL 5 MG PO TABS
5.0000 mg | ORAL_TABLET | ORAL | Status: DC | PRN
  Administered 2017-02-16 – 2017-02-26 (×62): 10 mg via ORAL
  Filled 2017-02-16 (×63): qty 2

## 2017-02-16 MED ORDER — ASPIRIN EC 325 MG PO TBEC
325.0000 mg | DELAYED_RELEASE_TABLET | Freq: Every day | ORAL | Status: DC
  Administered 2017-02-18 – 2017-02-26 (×9): 325 mg via ORAL
  Filled 2017-02-16 (×10): qty 1

## 2017-02-16 SURGICAL SUPPLY — 40 items
APL SKNCLS STERI-STRIP NONHPOA (GAUZE/BANDAGES/DRESSINGS) ×4
BENZOIN TINCTURE PRP APPL 2/3 (GAUZE/BANDAGES/DRESSINGS) ×4 IMPLANT
BLADE SURG 10 STRL SS (BLADE) ×1 IMPLANT
BLADE SURG 21 STRL SS (BLADE) ×2 IMPLANT
BNDG COHESIVE 6X5 TAN STRL LF (GAUZE/BANDAGES/DRESSINGS) IMPLANT
BNDG GAUZE ELAST 4 BULKY (GAUZE/BANDAGES/DRESSINGS) ×2 IMPLANT
CANISTER WOUND CARE 500ML ATS (WOUND CARE) ×2 IMPLANT
CASSETTE VERAFLO VERALINK (MISCELLANEOUS) ×1 IMPLANT
COVER SURGICAL LIGHT HANDLE (MISCELLANEOUS) ×4 IMPLANT
DRAPE U-SHAPE 47X51 STRL (DRAPES) ×2 IMPLANT
DRESSING VERAFLO CLEANSE CC (GAUZE/BANDAGES/DRESSINGS) IMPLANT
DRSG ADAPTIC 3X8 NADH LF (GAUZE/BANDAGES/DRESSINGS) ×1 IMPLANT
DRSG VERAFLO CLEANSE CC (GAUZE/BANDAGES/DRESSINGS) ×2
DURAPREP 26ML APPLICATOR (WOUND CARE) ×2 IMPLANT
ELECT REM PT RETURN 9FT ADLT (ELECTROSURGICAL)
ELECTRODE REM PT RTRN 9FT ADLT (ELECTROSURGICAL) IMPLANT
GAUZE SPONGE 4X4 12PLY STRL (GAUZE/BANDAGES/DRESSINGS) ×1 IMPLANT
GLOVE BIOGEL PI IND STRL 9 (GLOVE) ×1 IMPLANT
GLOVE BIOGEL PI INDICATOR 9 (GLOVE) ×1
GLOVE SURG ORTHO 9.0 STRL STRW (GLOVE) ×2 IMPLANT
GOWN STRL REUS W/ TWL XL LVL3 (GOWN DISPOSABLE) ×2 IMPLANT
GOWN STRL REUS W/TWL XL LVL3 (GOWN DISPOSABLE) ×4
HANDPIECE INTERPULSE COAX TIP (DISPOSABLE) ×2
KIT BASIN OR (CUSTOM PROCEDURE TRAY) ×2 IMPLANT
KIT ROOM TURNOVER OR (KITS) ×2 IMPLANT
MANIFOLD NEPTUNE II (INSTRUMENTS) ×2 IMPLANT
NS IRRIG 1000ML POUR BTL (IV SOLUTION) ×2 IMPLANT
PACK ORTHO EXTREMITY (CUSTOM PROCEDURE TRAY) ×2 IMPLANT
PAD ARMBOARD 7.5X6 YLW CONV (MISCELLANEOUS) ×4 IMPLANT
SET HNDPC FAN SPRY TIP SCT (DISPOSABLE) IMPLANT
STOCKINETTE IMPERVIOUS 9X36 MD (GAUZE/BANDAGES/DRESSINGS) IMPLANT
SUT ETHILON 2 0 PSLX (SUTURE) ×4 IMPLANT
SUT SILK 2 0 (SUTURE) ×2
SUT SILK 2-0 18XBRD TIE 12 (SUTURE) IMPLANT
SWAB COLLECTION DEVICE MRSA (MISCELLANEOUS) ×2 IMPLANT
TOWEL OR 17X26 10 PK STRL BLUE (TOWEL DISPOSABLE) ×2 IMPLANT
TUBE ANAEROBIC SPECIMEN COL (MISCELLANEOUS) ×1 IMPLANT
TUBE CONNECTING 12X1/4 (SUCTIONS) ×2 IMPLANT
TUBING VERAFLO DUO SET (SET/KITS/TRAYS/PACK) ×1 IMPLANT
YANKAUER SUCT BULB TIP NO VENT (SUCTIONS) ×2 IMPLANT

## 2017-02-16 NOTE — Anesthesia Procedure Notes (Signed)
Procedure Name: LMA Insertion Date/Time: 02/16/2017 2:28 PM Performed by: Jefm MilesENNIE, Kaiden Pech E Pre-anesthesia Checklist: Patient identified, Emergency Drugs available, Suction available and Patient being monitored Patient Re-evaluated:Patient Re-evaluated prior to inductionOxygen Delivery Method: Circle System Utilized Preoxygenation: Pre-oxygenation with 100% oxygen Intubation Type: IV induction Ventilation: Mask ventilation without difficulty LMA: LMA inserted LMA Size: 5.0 Number of attempts: 1 Placement Confirmation: positive ETCO2 Tube secured with: Tape Dental Injury: Teeth and Oropharynx as per pre-operative assessment

## 2017-02-16 NOTE — Transfer of Care (Signed)
Immediate Anesthesia Transfer of Care Note  Patient: Derrick [name]  Procedure(s) Performed: Procedure(s): Debridement Right Hip Disarticulation, Placement of Infusional Wound VAC (Right)  Patient Location: PACU  Anesthesia Type:General  Level of Consciousness: awake  Airway & Oxygen Therapy: Patient Spontanous Breathing and Patient connected to face mask oxygen  Post-op Assessment: Report given to RN  Post vital signs: Reviewed and stable  Last Vitals:  Vitals:   02/16/17 1148  BP: 111/84  Pulse: 98  Temp: 36.8 C    Last Pain:  Vitals:   02/16/17 1158  TempSrc:   PainSc: 10-Worst pain ever      Patients Stated Pain Goal: 3 (02/16/17 1158)  Complications: No apparent anesthesia complications

## 2017-02-16 NOTE — Anesthesia Preprocedure Evaluation (Addendum)
Anesthesia Evaluation  Patient identified by MRN, date of birth, ID band Patient awake    Reviewed: Allergy & Precautions, NPO status , Patient's Chart, lab work & pertinent test results  Airway Mallampati: II  TM Distance: >3 FB     Dental  (+) Poor Dentition, Partial Lower, Partial Upper   Pulmonary Current Smoker,    breath sounds clear to auscultation       Cardiovascular  Rhythm:Regular Rate:Normal     Neuro/Psych    GI/Hepatic   Endo/Other  diabetes  Renal/GU      Musculoskeletal   Abdominal   Peds  Hematology   Anesthesia Other Findings   Reproductive/Obstetrics                             Anesthesia Physical Anesthesia Plan  ASA: III  Anesthesia Plan: General   Post-op Pain Management:    Induction:   Airway Management Planned: LMA  Additional Equipment:   Intra-op Plan:   Post-operative Plan:   Informed Consent: I have reviewed the patients History and Physical, chart, labs and discussed the procedure including the risks, benefits and alternatives for the proposed anesthesia with the patient or authorized representative who has indicated his/her understanding and acceptance.   Dental advisory given  Plan Discussed with: CRNA and Anesthesiologist  Anesthesia Plan Comments:         Anesthesia Quick Evaluation

## 2017-02-16 NOTE — Op Note (Signed)
02/16/2017  3:34 PM  PATIENT:  Derrick [name]    PRE-OPERATIVE DIAGNOSIS:  Infected Right Hip Disarticulation   POST-OPERATIVE DIAGNOSIS:  Same  PROCEDURE:  Revision Right Hip Disarticulation, Placement of Infusional Wound VAC  SURGEON:  Nadara MustardMarcus V Taye Cato, MD  PHYSICIAN ASSISTANT:None ANESTHESIA:   General  PREOPERATIVE INDICATIONS:  Derrick [name] is a  46 y.o. male with a diagnosis of Infected Right Hip Disarticulation  who failed conservative measures and elected for surgical management.    The risks benefits and alternatives were discussed with the patient preoperatively including but not limited to the risks of infection, bleeding, nerve injury, cardiopulmonary complications, the need for revision surgery, among others, and the patient was willing to proceed.  OPERATIVE IMPLANTS: Instillation wound VAC set at 100 mm of suction 60 mL infiltration 10 minutes 12 time 1 hour suction  OPERATIVE FINDINGS: Abscess cultures obtained and sent for aerobic and anaerobic  OPERATIVE PROCEDURE: Patient was brought to the operating room and underwent a general anesthetic after adequate levels anesthesia obtained patient's right lower extremity was prepped using DuraPrep draped into a sterile field a timeout was called. A bump was placed under the right hip. Patient's sutures removed he did have a deep abscess cultures were obtained from the purulent drainage. Approximately 2 cm of skin was removed from around the wound edges. Further muscle was removed sharply with a 10 blade knife. 2-0 silk was used for ligation of the blood vessels. This was debrided back to healthy viable bleeding muscle. The wound was irrigated with pulsatile lavage 3 L. A instillation wound VAC sponge was applied this had a good suction fit. Patient was extubated taken the PACU in stable condition plan for continued instillation therapy over the weekend with return to the operating room on Wednesday.

## 2017-02-16 NOTE — Anesthesia Postprocedure Evaluation (Addendum)
Anesthesia Post Note  Patient: Derrick [name]  Procedure(s) Performed: Procedure(s) (LRB): Debridement Right Hip Disarticulation, Placement of Infusional Wound VAC (Right)  Patient location during evaluation: PACU Anesthesia Type: General Level of consciousness: awake, awake and alert and oriented Pain management: pain level controlled Vital Signs Assessment: post-procedure vital signs reviewed and stable Respiratory status: spontaneous breathing, nonlabored ventilation and respiratory function stable Anesthetic complications: no       Last Vitals:  Vitals:   02/16/17 1637 02/16/17 1730  BP: (!) 88/66   Pulse:    Resp: 17   Temp:  36.4 C    Last Pain:  Vitals:   02/16/17 1600  TempSrc:   PainSc: 10-Worst pain ever                 Patt Steinhardt COKER

## 2017-02-17 LAB — TRANSFUSION REACTION
DAT C3: NEGATIVE
Post RXN DAT IgG: NEGATIVE

## 2017-02-17 LAB — URINALYSIS, COMPLETE (UACMP) WITH MICROSCOPIC
BACTERIA UA: NONE SEEN
BILIRUBIN URINE: NEGATIVE
Glucose, UA: NEGATIVE mg/dL
Hgb urine dipstick: NEGATIVE
KETONES UR: NEGATIVE mg/dL
LEUKOCYTES UA: NEGATIVE
NITRITE: NEGATIVE
Protein, ur: 30 mg/dL — AB
Specific Gravity, Urine: 1.005 (ref 1.005–1.030)
Squamous Epithelial / LPF: NONE SEEN
pH: 6 (ref 5.0–8.0)

## 2017-02-17 LAB — CBC
HCT: 19.4 % — ABNORMAL LOW (ref 39.0–52.0)
Hemoglobin: 6.5 g/dL — CL (ref 13.0–17.0)
MCH: 27.1 pg (ref 26.0–34.0)
MCHC: 33.5 g/dL (ref 30.0–36.0)
MCV: 80.8 fL (ref 78.0–100.0)
PLATELETS: 508 10*3/uL — AB (ref 150–400)
RBC: 2.4 MIL/uL — ABNORMAL LOW (ref 4.22–5.81)
RDW: 15.8 % — AB (ref 11.5–15.5)
WBC: 19.1 10*3/uL — ABNORMAL HIGH (ref 4.0–10.5)

## 2017-02-17 LAB — GLUCOSE, CAPILLARY
GLUCOSE-CAPILLARY: 128 mg/dL — AB (ref 65–99)
GLUCOSE-CAPILLARY: 123 mg/dL — AB (ref 65–99)
Glucose-Capillary: 174 mg/dL — ABNORMAL HIGH (ref 65–99)
Glucose-Capillary: 149 mg/dL — ABNORMAL HIGH (ref 65–99)
Glucose-Capillary: 192 mg/dL — ABNORMAL HIGH (ref 65–99)

## 2017-02-17 LAB — PREPARE RBC (CROSSMATCH)

## 2017-02-17 MED ORDER — SODIUM CHLORIDE 0.9 % IV SOLN: Freq: Once | INTRAVENOUS | Status: DC

## 2017-02-17 NOTE — Progress Notes (Signed)
CRITICAL VALUE ALERT  Critical value received:  hgb 6.5  Date of notification: 02/17/2017  Time of notification:  0550  Critical value read back:Yes.    Nurse who received alert:  Ellaina Schuler  MD notified (1st page): Yates  Time of first page:  0600  MD notified (2nd page):  Time of second page:  Responding MD: Ophelia CharterYates  Time MD responded:  [id number]

## 2017-02-17 NOTE — Progress Notes (Signed)
At 1630 began blood transfusion.  Patient's temp was 98.2, at 1645 the pt's temp was 101.3.  I tried a different thermometer and temperature was 102.6.  Dr. Ophelia CharterYates was notified and instructions were given to follow the transfusion reaction protocol.

## 2017-02-17 NOTE — Progress Notes (Signed)
PT Cancellation Note  Patient Details Name: Berneda RoseBobby L Tinnel MRN: [id number] DOB: 07/16/1971   Cancelled Treatment:    Reason Eval/Treat Not Completed: Medical issues which prohibited therapy.  Hgb 6.5, (down from 10) BPs soft.  PT to check back later to see if pt will get blood and to see if it is safe to proceed with eval.  Thanks,    Lurena Joinerebecca B. Oluwatobi Ruppe, PT, DPT [phone]   02/17/2017, 9:31 AM

## 2017-02-17 NOTE — Progress Notes (Signed)
   Subjective: 1 Day Post-Op Procedure(s) (LRB): Debridement Right Hip Disarticulation, Placement of Infusional Wound VAC (Right) Patient reports pain as moderate and severe.    Objective: Vital signs in last 24 hours: Temp:  [97.5 F (36.4 C)-98.5 F (36.9 C)] 98.5 F (36.9 C) (03/02 2250) Pulse Rate:  [107-121] 121 (03/02 2250) Resp:  [9-30] 16 (03/02 2250) BP: (70-115)/(29-82) 105/82 (03/03 0843) SpO2:  [99 %-100 %] 100 % (03/02 2250)  Intake/Output from previous day: 03/02 0701 - 03/03 0700 In: 1270 [I.V.:1150] Out: 725 [Urine:225; Drains:300; Blood:200] Intake/Output this shift: No intake/output data recorded.   Recent Labs  02/16/17 1205 02/17/17 0401  HGB 10.3* 6.5*    Recent Labs  02/16/17 1205 02/17/17 0401  WBC 16.6* 19.1*  RBC 3.74* 2.40*  HCT 30.8* 19.4*  PLT 652* 508*    Recent Labs  02/16/17 1205  NA 132*  K 4.0  CL 96*  CO2 27  BUN 11  CREATININE 0.67  GLUCOSE 181*  CALCIUM 8.9   No results for input(s): LABPT, INR in the last 72 hours.  VAC output 500CC plus No results found.  Assessment/Plan: 1 Day Post-Op Procedure(s) (LRB): Debridement Right Hip Disarticulation, Placement of Infusional Wound VAC (Right) Plan transfuse 2 units PRBC  Derrick [name] 02/17/2017, 1:07 PM

## 2017-02-18 LAB — CBC
HCT: 18.8 % — ABNORMAL LOW (ref 39.0–52.0)
Hemoglobin: 6.3 g/dL — CL (ref 13.0–17.0)
MCH: 27.3 pg (ref 26.0–34.0)
MCHC: 33.5 g/dL (ref 30.0–36.0)
MCV: 81.4 fL (ref 78.0–100.0)
PLATELETS: 458 10*3/uL — AB (ref 150–400)
RBC: 2.31 MIL/uL — AB (ref 4.22–5.81)
RDW: 15.9 % — ABNORMAL HIGH (ref 11.5–15.5)
WBC: 11.5 10*3/uL — AB (ref 4.0–10.5)

## 2017-02-18 LAB — PREPARE RBC (CROSSMATCH)

## 2017-02-18 LAB — GLUCOSE, CAPILLARY
GLUCOSE-CAPILLARY: 277 mg/dL — AB (ref 65–99)
GLUCOSE-CAPILLARY: 121 mg/dL — AB (ref 65–99)
GLUCOSE-CAPILLARY: 117 mg/dL — AB (ref 65–99)
Glucose-Capillary: 149 mg/dL — ABNORMAL HIGH (ref 65–99)

## 2017-02-18 MED ORDER — DIPHENHYDRAMINE HCL 25 MG PO CAPS
25.0000 mg | ORAL_CAPSULE | Freq: Once | ORAL | Status: AC
  Administered 2017-02-18: 25 mg via ORAL
  Filled 2017-02-18: qty 1

## 2017-02-18 MED ORDER — SODIUM CHLORIDE 0.9 % IV SOLN
Freq: Once | INTRAVENOUS | Status: AC
  Administered 2017-02-18: 23:00:00 via INTRAVENOUS

## 2017-02-18 MED ORDER — ACETAMINOPHEN 325 MG PO TABS
650.0000 mg | ORAL_TABLET | Freq: Once | ORAL | Status: AC
  Administered 2017-02-18: 650 mg via ORAL
  Filled 2017-02-18: qty 2

## 2017-02-18 NOTE — H&P (Signed)
Derrick [name] is an 46 y.o. male.   Chief Complaint: Abscess right hip disarticulation. HPI: Patient presents in follow up with recurrent infection right hip disarticulation.  Past Medical History:  Diagnosis Date  . Anemia   . Arthritis   . Diabetes mellitus    Type II  . GERD (gastroesophageal reflux disease)   . Neuropathy ([place])   . Peripheral neuropathy ([place])   . Pneumonia   . Prostatitis     Past Surgical History:  Procedure Laterality Date  . AMPUTATION  05/31/2012   Procedure: AMPUTATION DIGIT;  Surgeon: Newt Minion, MD;  Location: [place];  Service: Orthopedics;  Laterality: Right;  right fourth and fifth toe ray amputation   . AMPUTATION Right 12/14/2016   Procedure: AMPUTATION BELOW KNEE RIGHT;  Surgeon: Newt Minion, MD;  Location: [place];  Service: Orthopedics;  Laterality: Right;  . AMPUTATION Right 01/31/2017   Procedure: Right Above Knee Amputation;  Surgeon: Newt Minion, MD;  Location: [place];  Service: Orthopedics;  Laterality: Right;  . APPLICATION OF WOUND VAC Right 02/05/2017   Procedure: APPLICATION OF WOUND VAC;  Surgeon: Newt Minion, MD;  Location: [place];  Service: Orthopedics;  Laterality: Right;  . I&D EXTREMITY  08/02/2012   Procedure: IRRIGATION AND DEBRIDEMENT EXTREMITY;  Surgeon: Newt Minion, MD;  Location: [place];  Service: Orthopedics;  Laterality: Right;  Right Foot Irrigation and Debridement, Place Antibiotic Beads  . SHOULDER SURGERY Bilateral    rotarcuff  . STUMP REVISION Left 07/12/2016   Procedure: Revision Left Below Knee Amputation;  Surgeon: Newt Minion, MD;  Location: [place];  Service: Orthopedics;  Laterality: Left;  . STUMP REVISION Right 01/24/2017   Procedure: Revision Right Below Knee Amputation;  Surgeon: Newt Minion, MD;  Location: [place];  Service: Orthopedics;  Laterality: Right;  . STUMP REVISION Right 02/05/2017   Procedure: REVISION RIGHT AKA;  Surgeon: Newt Minion, MD;  Location: [place];  Service: Orthopedics;  Laterality:  Right;  . STUMP REVISION Right 02/07/2017   Procedure: RIGHT HIP DISARTICULATION;  Surgeon: Newt Minion, MD;  Location: [place];  Service: Orthopedics;  Laterality: Right;  . TOE AMPUTATION     multiple toes amputated.    Family History  Problem Relation Age of Onset  . Cancer Mother   . Congestive Heart Failure Father    Social History:  reports that he has been smoking Cigarettes.  He has a 48.00 pack-year smoking history. He has never used smokeless tobacco. He reports that he does not drink alcohol or use drugs.  Allergies:  Allergies  Allergen Reactions  . Vancomycin Other (See Comments)    Acute kidney injury    Medications Prior to Admission  Medication Sig Dispense Refill  . amoxicillin-clavulanate (AUGMENTIN) 875-125 MG tablet Take 1 tablet by mouth 2 (two) times daily. (Patient taking differently: Take 1 tablet by mouth 2 (two) times daily. 28 day course filled 02/11/17) 56 tablet 0  . gabapentin (NEURONTIN) 100 MG capsule Take 1 capsule (100 mg total) by mouth 3 (three) times daily. 90 capsule 0  . glipiZIDE (GLUCOTROL) 5 MG tablet Take 0.5 tablets (2.5 mg total) by mouth daily before breakfast. 15 tablet 0  . insulin NPH Human (NOVOLIN N) 100 UNIT/ML injection Please inject 7 units subcutaneously the morning and 7 units in the evening. (Patient taking differently: Inject 7 Units into the skin 2 (two) times daily before a meal. ) 3 mL 3  .  methocarbamol (ROBAXIN) 500 MG tablet Take 1 tablet (500 mg total) by mouth every 6 (six) hours as needed for muscle spasms. 30 tablet 0  . oxyCODONE-acetaminophen (PERCOCET) 7.5-325 MG tablet Take 1 tablet by mouth every 6 (six) hours as needed for severe pain. 29 tablet 0  . promethazine (PHENERGAN) 25 MG tablet Take 25 mg by mouth every 6 (six) hours as needed for nausea or vomiting.    Marland Kitchen acetaminophen (TYLENOL) 325 MG tablet Take 2 tablets (650 mg total) by mouth every 6 (six) hours as needed for mild pain or fever (or Fever >/= 101).  Acetaminophen dose from all sources not to exceed 4 g per day. (Patient not taking: Reported on 02/17/2017)    . blood glucose meter kit and supplies KIT Dispense based on patient and insurance preference. Use up to four times daily as directed. (FOR ICD-9 250.00, 250.01). 1 each 0  . glucose blood (RELION GLUCOSE TEST STRIPS) test strip Use as instructed 100 each 12  . Insulin Pen Needle (PEN NEEDLES 3/16") 31G X 5 MM MISC Use as directed 3 times daily with meals and at bedtime. 120 each 0  . lisinopril (PRINIVIL,ZESTRIL) 10 MG tablet Take 1 tablet (10 mg total) by mouth daily. (Patient not taking: Reported on 02/17/2017) 30 tablet 0  . nicotine (NICODERM CQ - DOSED IN MG/24 HOURS) 21 mg/24hr patch Place 1 patch (21 mg total) onto the skin daily. (Patient not taking: Reported on 02/17/2017) 28 patch 0  . Saccharomyces boulardii (PROBIOTIC) 250 MG CAPS Take 1 capsule by mouth daily. (Patient not taking: Reported on 02/17/2017) 42 capsule 0    Results for orders placed or performed during the hospital encounter of 02/16/17 (from the past 48 hour(s))  Glucose, capillary     Status: Abnormal   Collection Time: 02/16/17 11:54 AM  Result Value Ref Range   Glucose-Capillary 175 (H) 65 - 99 mg/dL  CBC     Status: Abnormal   Collection Time: 02/16/17 12:05 PM  Result Value Ref Range   WBC 16.6 (H) 4.0 - 10.5 K/uL   RBC 3.74 (L) 4.22 - 5.81 MIL/uL   Hemoglobin 10.3 (L) 13.0 - 17.0 g/dL   HCT 30.8 (L) 39.0 - 52.0 %   MCV 82.4 78.0 - 100.0 fL   MCH 27.5 26.0 - 34.0 pg   MCHC 33.4 30.0 - 36.0 g/dL   RDW 16.0 (H) 11.5 - 15.5 %   Platelets 652 (H) 150 - 400 K/uL  Basic metabolic panel     Status: Abnormal   Collection Time: 02/16/17 12:05 PM  Result Value Ref Range   Sodium 132 (L) 135 - 145 mmol/L   Potassium 4.0 3.5 - 5.1 mmol/L   Chloride 96 (L) 101 - 111 mmol/L   CO2 27 22 - 32 mmol/L   Glucose, Bld 181 (H) 65 - 99 mg/dL   BUN 11 6 - 20 mg/dL   Creatinine, Ser 0.67 0.61 - 1.24 mg/dL   Calcium 8.9  8.9 - 10.3 mg/dL   GFR calc non Af Amer >60 >60 mL/min   GFR calc Af Amer >60 >60 mL/min    Comment: (NOTE) The eGFR has been calculated using the CKD EPI equation. This calculation has not been validated in all clinical situations. eGFR's persistently <60 mL/min signify possible Chronic Kidney Disease.    Anion gap 9 5 - 15  Aerobic/Anaerobic Culture (surgical/deep wound)     Status: None (Preliminary result)   Collection Time: 02/16/17  2:55  PM  Result Value Ref Range   Specimen Description WOUND RIGHT HIP    Special Requests PATIENT ON FOLLOWING ANCEF    Gram Stain      ABUNDANT WBC PRESENT, PREDOMINANTLY MONONUCLEAR ABUNDANT GRAM POSITIVE COCCI IN CLUSTERS IN PAIRS ABUNDANT GRAM NEGATIVE RODS    Culture TOO YOUNG TO READ    Report Status PENDING   Glucose, capillary     Status: Abnormal   Collection Time: 02/16/17  3:39 PM  Result Value Ref Range   Glucose-Capillary 151 (H) 65 - 99 mg/dL  Glucose, capillary     Status: Abnormal   Collection Time: 02/16/17 11:23 PM  Result Value Ref Range   Glucose-Capillary 335 (H) 65 - 99 mg/dL  CBC     Status: Abnormal   Collection Time: 02/17/17  4:01 AM  Result Value Ref Range   WBC 19.1 (H) 4.0 - 10.5 K/uL   RBC 2.40 (L) 4.22 - 5.81 MIL/uL   Hemoglobin 6.5 (LL) 13.0 - 17.0 g/dL    Comment: REPEATED TO VERIFY CRITICAL RESULT CALLED TO, READ BACK BY AND VERIFIED WITH: M.YIM,RN [id number] 02/17/17 M.CAMPBELL    HCT 19.4 (L) 39.0 - 52.0 %   MCV 80.8 78.0 - 100.0 fL   MCH 27.1 26.0 - 34.0 pg   MCHC 33.5 30.0 - 36.0 g/dL   RDW 15.8 (H) 11.5 - 15.5 %   Platelets 508 (H) 150 - 400 K/uL  Type and screen [facility]     Status: None (Preliminary result)   Collection Time: 02/17/17  6:38 AM  Result Value Ref Range   ABO/RH(D) A POS    Antibody Screen NEG    Sample Expiration 02/20/2017    Unit Number [id number]    Blood Component Type RED CELLS,LR    Unit division 00    Status of Unit ISSUED,FINAL    Transfusion  Status OK TO TRANSFUSE    Crossmatch Result Compatible    Unit Number [id number]    Blood Component Type RED CELLS,LR    Unit division 00    Status of Unit ALLOCATED    Transfusion Status OK TO TRANSFUSE    Crossmatch Result Compatible   Prepare RBC     Status: None   Collection Time: 02/17/17  6:38 AM  Result Value Ref Range   Order Confirmation ORDER PROCESSED BY BLOOD BANK   Glucose, capillary     Status: Abnormal   Collection Time: 02/17/17  6:44 AM  Result Value Ref Range   Glucose-Capillary 174 (H) 65 - 99 mg/dL  Glucose, capillary     Status: Abnormal   Collection Time: 02/17/17  8:10 AM  Result Value Ref Range   Glucose-Capillary 149 (H) 65 - 99 mg/dL  Glucose, capillary     Status: Abnormal   Collection Time: 02/17/17 11:48 AM  Result Value Ref Range   Glucose-Capillary 128 (H) 65 - 99 mg/dL  Glucose, capillary     Status: Abnormal   Collection Time: 02/17/17  4:56 PM  Result Value Ref Range   Glucose-Capillary 123 (H) 65 - 99 mg/dL  Glucose, capillary     Status: Abnormal   Collection Time: 02/17/17  8:21 PM  Result Value Ref Range   Glucose-Capillary 192 (H) 65 - 99 mg/dL   Comment 1 Document in Chart   Transfusion reaction     Status: None   Collection Time: 02/17/17  9:38 PM  Result Value Ref Range   Post RXN DAT IgG NEG  DAT C3 NEG    Path interp tx rxn      PER DR. PATRICK ON 02/17/17 AT 21:25, MOST LIKELY A FEBRILE REACTION . PATIENT CAN BE TRANSFUSED ADDITIONAL UNITS IF NECESSARY. PREMEDICATION PRIOR TO TRANSFUSION IS RECOMMENDED.  Urinalysis, Complete w Microscopic     Status: Abnormal   Collection Time: 02/17/17  9:39 PM  Result Value Ref Range   Color, Urine STRAW (A) YELLOW   APPearance CLEAR CLEAR   Specific Gravity, Urine 1.005 1.005 - 1.030   pH 6.0 5.0 - 8.0   Glucose, UA NEGATIVE NEGATIVE mg/dL   Hgb urine dipstick NEGATIVE NEGATIVE   Bilirubin Urine NEGATIVE NEGATIVE   Ketones, ur NEGATIVE NEGATIVE mg/dL   Protein, ur 30 (A) NEGATIVE  mg/dL   Nitrite NEGATIVE NEGATIVE   Leukocytes, UA NEGATIVE NEGATIVE   RBC / HPF 0-5 0 - 5 RBC/hpf   WBC, UA 0-5 0 - 5 WBC/hpf   Bacteria, UA NONE SEEN NONE SEEN   Squamous Epithelial / LPF NONE SEEN NONE SEEN  CBC     Status: Abnormal   Collection Time: 02/18/17  3:18 AM  Result Value Ref Range   WBC 11.5 (H) 4.0 - 10.5 K/uL   RBC 2.31 (L) 4.22 - 5.81 MIL/uL   Hemoglobin 6.3 (LL) 13.0 - 17.0 g/dL    Comment: REPEATED TO VERIFY CRITICAL VALUE NOTED.  VALUE IS CONSISTENT WITH PREVIOUSLY REPORTED AND CALLED VALUE.    HCT 18.8 (L) 39.0 - 52.0 %   MCV 81.4 78.0 - 100.0 fL   MCH 27.3 26.0 - 34.0 pg   MCHC 33.5 30.0 - 36.0 g/dL   RDW 15.9 (H) 11.5 - 15.5 %   Platelets 458 (H) 150 - 400 K/uL  Glucose, capillary     Status: Abnormal   Collection Time: 02/18/17  6:14 AM  Result Value Ref Range   Glucose-Capillary 277 (H) 65 - 99 mg/dL   Comment 1 Document in Chart    No results found.  Review of Systems  All other systems reviewed and are negative.   Blood pressure 109/72, pulse (!) 108, temperature 98 F (36.7 C), temperature source Oral, resp. rate 18, height _0  (1.854 m), weight 200 lb (90.7 kg), SpO2 100 %. Physical Exam  On examination patient is alert oriented no adenopathy well-dressed normal affect normal history effort he has no drainage or cellulitis at this time but is symptomatically consistent with recurrent abscess. Assessment/Plan Assessment: Recurrent abscess right hip disarticulation.  Plan: We'll plan for repeat revision hip disarticulation with placement of an instillation wound VAC plan for return to the operating room on Wednesday.  Newt Minion, MD 02/18/2017, 9:49 AM

## 2017-02-18 NOTE — Progress Notes (Signed)
Spoke to Dr. Ophelia CharterYates at 2115. Informed about lab results, vital signs and patient requests(check liver enzymes and change benadryl to pill form).Ordered to premedicate with benadryl and Tylenol, transfuse 2 units of PRBC, check H&H 2 hrs post transfusion, and check CMP in the am. Benadryl tablets are not available in the hospital, will keep elixir form. Also encouraged Incentive spirometer and avoid IV pain meds due to hypotension. Will continue to monitor.

## 2017-02-18 NOTE — Progress Notes (Signed)
Rehab Admissions Coordinator Note:  Patient was screened by Clois DupesBoyette, Harace Mccluney Godwin for appropriateness for an Inpatient Acute Rehab Consult per PT recommendation.  At this time, we are recommending await further progress with therapy before determining rehab venue options.. During last admission, pt declined further participation with therapy and d/c'd home. Pt would have to agree to more therapy participation on acute before he would be considered for a possible inpt rehab admission. Pt is required to participate with three hours per day of therapy throughout the day during an inpt rehab admission.  Clois DupesBoyette, Shelvie Salsberry Godwin 02/18/2017, 11:21 AM  I can be reached at [phone].

## 2017-02-18 NOTE — Evaluation (Signed)
Physical Therapy Evaluation Patient Details Name: Derrick [name] MRN: [id number] DOB: 11/02/1971 Today's Date: 02/18/2017   History of Present Illness  Presented in Ortho follow up with abscess post R hip disarticulation; now s/p I&D and wound VAC placement;  has a past medical history of Anemia; Arthritis; Diabetes mellitus; GERD (gastroesophageal reflux disease); Neuropathy ([facility]); Peripheral neuropathy ([facility]); Pneumonia; and Prostatitis. 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 Managing at home at wheelchair level prior to coming in with abscess and infection post R hip disarticulation; Presents with decr activity tolerance (likely related to low Hgb), and pain; Very motivated; Worth considering CIR stay to maximize independence and safety with mobility prior to dc home;  Patient will benefit from skilled PT to increase their independence and safety with mobility to allow discharge to the venue listed below.       Follow Up Recommendations CIR (if pt d/c home will need HHPT/OT/aide/RN )    Equipment Recommendations  Wheelchair (measurements PT);Wheelchair cushion (measurements PT)    Recommendations for Other Services OT consult     Precautions / Restrictions Precautions Precautions: Fall Precaution Comments: bilateral amputee Required Braces or Orthoses: Other Brace/Splint Other Brace/Splint: Lt BKA prosthesis  Restrictions Weight Bearing Restrictions: No      Mobility  Bed Mobility Overal bed mobility: Needs Assistance Bed Mobility: Supine to Sit;Sit to Supine     Supine to sit: Max assist Sit to supine: Max assist   General bed mobility comments: Heavy reliance on handheld assist and support at bil elbows to pull to sit  Transfers                 General transfer comment:  Derrick [name] politely declines transferring OOB today; noted signs of orhtostatis with sitting up in bed  Ambulation/Gait                Stairs            Wheelchair Mobility    Modified Rankin (Stroke Patients Only)       Balance     Sitting balance-Leahy Scale: Poor Sitting balance - Comments: Heavy dependence on Bil UE support for sitting balance                                     Pertinent Vitals/Pain Pain Assessment: Faces Faces Pain Scale: Hurts little more Pain Location: Rt buttock  Pain Descriptors / Indicators: Aching;Sore;Jabbing Pain Intervention(s): Monitored during session;Premedicated before session    Home Living Family/patient expects to be discharged to:: Private residence Living Arrangements: Other (Comment);Spouse/significant other (girlfriend) Available Help at Discharge: Friend(s);Family;Available PRN/intermittently (girlfriend, sister) Type of Home: House Home Access: Ramped entrance     Home Layout: One level Home Equipment: Art gallery managerlectric scooter;Wheelchair - [employer] Corporationmanual;Walker - 2 wheels Additional Comments: Psychologist, forensicelectric scooter X2    Prior Function Level of Independence: Independent with assistive device(s)         Comments: Reports using scooter for mobility. Was doing scooter<>bed transfers independently. For the past month pt states he spent alot of time in bed due to R leg pain and mobility issues. 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      Hand Dominance  Dominant Hand: Right    Extremity/Trunk Assessment   Upper Extremity Assessment Upper Extremity Assessment: Generalized weakness    Lower Extremity Assessment Lower Extremity Assessment: Generalized weakness LLE Deficits / Details: BKA; overall good ROM hip and knee; tells me he likes to bed able to get prone    Cervical / Trunk Assessment Cervical / Trunk Assessment: Normal   Communication   Communication: No difficulties  Cognition Arousal/Alertness: Awake/alert Behavior During Therapy: WFL for tasks assessed/performed Overall Cognitive Status: Within Functional Limits for tasks assessed                      General Comments General comments (skin integrity, edema, etc.):   02/18/17 0920  Vital Signs  Patient Position (if appropriate) Orthostatic Vitals  Orthostatic Lying   BP- Lying 120/81  Pulse- Lying 104  Orthostatic Sitting  BP- Sitting 94/72  Pulse- Sitting 104       Exercises     Assessment/Plan    PT Assessment Patient needs continued PT services  PT Problem List Decreased strength;Decreased range of motion;Decreased activity tolerance;Decreased balance;Decreased mobility;Pain       PT Treatment Interventions DME instruction;Functional mobility training;Therapeutic activities;Therapeutic exercise;Balance training;Neuromuscular re-education;Patient/family education    PT Goals (Current goals can be found in the Care Plan section)  Acute Rehab PT Goals Patient Stated Goal: Wants to feel better PT Goal Formulation: With patient Time For Goal Achievement: 03/04/17 Potential to Achieve Goals: Good    Frequency Min 3X/week   Barriers to discharge   Must be modified independent at wheelchair level to be able to dc home    Co-evaluation               End of Session   Activity Tolerance: Patient limited by fatigue;Patient limited by pain Patient left: in bed;with call bell/phone within reach Nurse Communication: Mobility status PT Visit Diagnosis: Muscle weakness (generalized) (M62.81);Pain Pain - Right/Left: Right Pain - part of body: Hip         Time: 4098-1191 PT Time Calculation (min) (ACUTE ONLY): 20 min   Charges:   PT Evaluation $PT Eval Moderate Complexity: 1 Procedure     PT G CodesLevi Aland 02/18/2017, 11:14 AM  Van Clines, PT  Acute Rehabilitation Services Pager  [phone] Office [phone]

## 2017-02-18 NOTE — Progress Notes (Signed)
   Subjective: 2 Days Post-Op Procedure(s) (LRB): Debridement Right Hip Disarticulation, Placement of Infusional Wound VAC (Right) Patient reports pain as mild.    Objective: Vital signs in last 24 hours: Temp:  [98 F (36.7 C)-102.6 F (39.2 C)] 98 F (36.7 C) (03/04 0616) Pulse Rate:  [108-121] 108 (03/04 0616) Resp:  [16-18] 18 (03/03 1645) BP: (89-109)/(61-78) 109/72 (03/04 0616) SpO2:  [98 %-100 %] 100 % (03/04 0616)  Intake/Output from previous day: 03/03 0701 - 03/04 0700 In: 720 [P.O.:720] Out: 1350 [Drains:1350] Intake/Output this shift: Total I/O In: 120 [P.O.:120] Out: -    Recent Labs  02/16/17 1205 02/17/17 0401 02/18/17 0318  HGB 10.3* 6.5* 6.3*    Recent Labs  02/17/17 0401 02/18/17 0318  WBC 19.1* 11.5*  RBC 2.40* 2.31*  HCT 19.4* 18.8*  PLT 508* 458*    Recent Labs  02/16/17 1205  NA 132*  K 4.0  CL 96*  CO2 27  BUN 11  CREATININE 0.67  GLUCOSE 181*  CALCIUM 8.9   No results for input(s): LABPT, INR in the last 72 hours.  VAC drainage is getting clearer.  No results found.  Assessment/Plan: 2 Days Post-Op Procedure(s) (LRB): Debridement Right Hip Disarticulation, Placement of Infusional Wound VAC (Right) Plan :  Therapy working with patient , checking BP. Had febrile reaction with start of blood transfusion. If othostatic with hgb 6.3 may still need transfusion.   Derrick MangesMark C [name] [place]ley 02/18/2017, 9:27 AM

## 2017-02-19 ENCOUNTER — Other Ambulatory Visit (INDEPENDENT_AMBULATORY_CARE_PROVIDER_SITE_OTHER): Payer: Self-pay | Admitting: Family

## 2017-02-19 ENCOUNTER — Encounter (HOSPITAL_COMMUNITY): Payer: Self-pay | Admitting: Orthopedic Surgery

## 2017-02-19 ENCOUNTER — Inpatient Hospital Stay (HOSPITAL_COMMUNITY): Payer: Medicare Other

## 2017-02-19 LAB — COMPREHENSIVE METABOLIC PANEL
ALBUMIN: 1.7 g/dL — AB (ref 3.5–5.0)
ALK PHOS: 284 U/L — AB (ref 38–126)
ALT: 20 U/L (ref 17–63)
ALT: 22 U/L (ref 17–63)
AST: 32 U/L (ref 15–41)
AST: 39 U/L (ref 15–41)
Albumin: 1.8 g/dL — ABNORMAL LOW (ref 3.5–5.0)
Alkaline Phosphatase: 323 U/L — ABNORMAL HIGH (ref 38–126)
Anion gap: 6 (ref 5–15)
Anion gap: 9 (ref 5–15)
BILIRUBIN TOTAL: 0.3 mg/dL (ref 0.3–1.2)
BUN: 8 mg/dL (ref 6–20)
BUN: 7 mg/dL (ref 6–20)
CHLORIDE: 97 mmol/L — AB (ref 101–111)
CHLORIDE: 95 mmol/L — AB (ref 101–111)
CO2: 28 mmol/L (ref 22–32)
CO2: 28 mmol/L (ref 22–32)
CREATININE: 0.74 mg/dL (ref 0.61–1.24)
Calcium: 7.8 mg/dL — ABNORMAL LOW (ref 8.9–10.3)
Calcium: 7.6 mg/dL — ABNORMAL LOW (ref 8.9–10.3)
Creatinine, Ser: 0.68 mg/dL (ref 0.61–1.24)
GFR calc Af Amer: >60 mL/min (ref >60)
GFR calc non Af Amer: >60 mL/min (ref >60)
GFR calc non Af Amer: >60 mL/min (ref >60)
Glucose, Bld: 182 mg/dL — ABNORMAL HIGH (ref 65–99)
Glucose, Bld: 102 mg/dL — ABNORMAL HIGH (ref 65–99)
POTASSIUM: 3.7 mmol/L (ref 3.5–5.1)
POTASSIUM: 3.3 mmol/L — AB (ref 3.5–5.1)
SODIUM: 132 mmol/L — AB (ref 135–145)
Sodium: 131 mmol/L — ABNORMAL LOW (ref 135–145)
TOTAL PROTEIN: 6.5 g/dL (ref 6.5–8.1)
Total Bilirubin: 0.3 mg/dL (ref 0.3–1.2)
Total Protein: 6 g/dL — ABNORMAL LOW (ref 6.5–8.1)

## 2017-02-19 LAB — CBC WITH DIFFERENTIAL/PLATELET
BASOS ABS: 0 10*3/uL (ref 0.0–0.1)
BASOS PCT: 0 %
Eosinophils Absolute: 0.4 10*3/uL (ref 0.0–0.7)
Eosinophils Relative: 3 %
HEMATOCRIT: 23.8 % — AB (ref 39.0–52.0)
HEMOGLOBIN: 7.9 g/dL — AB (ref 13.0–17.0)
Lymphocytes Relative: 17 %
Lymphs Abs: 2.5 10*3/uL (ref 0.7–4.0)
MCH: 26.9 pg (ref 26.0–34.0)
MCHC: 33.2 g/dL (ref 30.0–36.0)
MCV: 81 fL (ref 78.0–100.0)
MONO ABS: 1.1 10*3/uL — AB (ref 0.1–1.0)
Monocytes Relative: 7 %
NEUTROS ABS: 11.1 10*3/uL — AB (ref 1.7–7.7)
NEUTROS PCT: 73 %
Platelets: 471 10*3/uL — ABNORMAL HIGH (ref 150–400)
RBC: 2.94 MIL/uL — AB (ref 4.22–5.81)
RDW: 14.7 % (ref 11.5–15.5)
WBC: 15.2 10*3/uL — ABNORMAL HIGH (ref 4.0–10.5)

## 2017-02-19 LAB — GLUCOSE, CAPILLARY
GLUCOSE-CAPILLARY: 168 mg/dL — AB (ref 65–99)
GLUCOSE-CAPILLARY: 127 mg/dL — AB (ref 65–99)
Glucose-Capillary: 188 mg/dL — ABNORMAL HIGH (ref 65–99)
Glucose-Capillary: 210 mg/dL — ABNORMAL HIGH (ref 65–99)
Glucose-Capillary: 166 mg/dL — ABNORMAL HIGH (ref 65–99)

## 2017-02-19 LAB — CBC
HEMATOCRIT: 25.6 % — AB (ref 39.0–52.0)
Hemoglobin: 8.3 g/dL — ABNORMAL LOW (ref 13.0–17.0)
MCH: 26.4 pg (ref 26.0–34.0)
MCHC: 32.4 g/dL (ref 30.0–36.0)
MCV: 81.5 fL (ref 78.0–100.0)
PLATELETS: 485 10*3/uL — AB (ref 150–400)
RBC: 3.14 MIL/uL — ABNORMAL LOW (ref 4.22–5.81)
RDW: 14.9 % (ref 11.5–15.5)
WBC: 18 10*3/uL — AB (ref 4.0–10.5)

## 2017-02-19 LAB — LACTIC ACID, PLASMA
Lactic Acid, Venous: 1.6 mmol/L (ref 0.5–1.9)
Lactic Acid, Venous: 1.3 mmol/L (ref 0.5–1.9)

## 2017-02-19 LAB — PROTIME-INR
INR: 1.01
PROTHROMBIN TIME: 13.3 s (ref 11.4–15.2)

## 2017-02-19 LAB — PROCALCITONIN: Procalcitonin: 0.19 ng/mL

## 2017-02-19 LAB — APTT: APTT: 36 s (ref 24–36)

## 2017-02-19 MED ORDER — SODIUM CHLORIDE 0.9 % IV BOLUS (SEPSIS)
1000.0000 mL | Freq: Once | INTRAVENOUS | Status: AC
  Administered 2017-02-19: 1000 mL via INTRAVENOUS

## 2017-02-19 MED ORDER — DEXTROSE 5 % IV SOLN
1.0000 g | Freq: Three times a day (TID) | INTRAVENOUS | Status: DC
  Administered 2017-02-19 – 2017-02-21 (×5): 1 g via INTRAVENOUS
  Filled 2017-02-19 (×6): qty 1

## 2017-02-19 MED ORDER — SODIUM CHLORIDE 0.9 % IV BOLUS (SEPSIS): 1000.0000 mL | Freq: Once | INTRAVENOUS | Status: DC

## 2017-02-19 NOTE — Progress Notes (Signed)
Patient ID: Berneda RoseBobby L Lavalle, male   DOB: 01/15/1971, 46 y.o.   MRN: [id number] Patient states that his surgical site feels good without pain. Wound VAC is functioning well. Patient received 2 units of packed red blood cells. CBC from this morning still pending. We will plan for repeat surgery on Wednesday. If there is no signs of infection we will close of the wound. If patient still has infection Wednesday, will continue with the infusion wound VAC with repeat surgery on Friday.

## 2017-02-19 NOTE — Consult Note (Signed)
Medical Consultation   Derrick RoseBobby L Selden  ION:[id number]  DOB: 02/25/1971  DOA: 02/16/2017  PCP: Mickie HillierIan McKeag, MD    Requesting physician: Dr. Lajoyce Cornersuda - ortho service  Reason for consultation: hypotension  History of Present Illness: Derrick [name] is an 46 y.o. 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 Patient returned to the OR on the following day and underwent surgical I&D of the right AKA, wound culture grew S. Agalactiae sensitive to Rocephin. On 02/07/2017 patient d/t progressive infection extending to the hip patient underwent right hip disarticulation with placement of wound vac. Patient was readmitted due to recurrent right hip infection was hospitalized and were asked to see patient in consultation to address his low blood pressure    Review of Systems:  ROS: c/o right hip pain, SOB, denied chest pain, abdominal pain, nausea As per HPI otherwise 10 point review of systems negative.    Past Medical History: Past Medical History:  Diagnosis Date  . Anemia   . Arthritis   . Diabetes mellitus    Type II  . GERD (gastroesophageal reflux disease)   . Neuropathy (HCC)   . Peripheral neuropathy (HCC)   . Pneumonia   . Prostatitis     Past Surgical History: Past Surgical History:  Procedure Laterality Date  . AMPUTATION  05/31/2012   Procedure: AMPUTATION DIGIT;  Surgeon: Nadara MustardMarcus V Duda, MD;  [facility];  Service: Orthopedics;  Laterality: Right;  right fourth and fifth toe ray amputation   . AMPUTATION Right 12/14/2016   Procedure: AMPUTATION BELOW KNEE RIGHT;  Surgeon: Nadara MustardMarcus Duda V, MD;  Location: [facility] OR;  Service: Orthopedics;  Laterality: Right;  . AMPUTATION Right 01/31/2017   Procedure: Right Above Knee Amputation;  Surgeon: Nadara MustardMarcus Duda V, MD;   [facility];  Service: Orthopedics;  Laterality: Right;  . APPLICATION OF WOUND VAC Right 02/05/2017   Procedure: APPLICATION OF WOUND VAC;  Surgeon: Nadara MustardMarcus Duda V, MD;  Location: [facility] OR;  Service: Orthopedics;  Laterality: Right;  . I&D EXTREMITY  08/02/2012   Procedure: IRRIGATION AND DEBRIDEMENT EXTREMITY;  Surgeon: Nadara MustardMarcus V Duda, MD;  Location: [facility] OR;  Service: Orthopedics;  Laterality: Right;  Right Foot Irrigation and Debridement, Place Antibiotic Beads  . SHOULDER SURGERY Bilateral    rotarcuff  . STUMP REVISION Left 07/12/2016   Procedure: Revision Left Below Knee Amputation;  Surgeon: Nadara MustardMarcus V Duda, MD;  [facility];  Service: Orthopedics;  Laterality: Left;  . STUMP REVISION Right 01/24/2017   Procedure: Revision Right Below Knee Amputation;  Surgeon: Nadara MustardMarcus Duda V, MD;  [facility];  Service: Orthopedics;  Laterality: Right;  . STUMP REVISION Right 02/05/2017   Procedure: REVISION RIGHT AKA;  Surgeon: Nadara MustardMarcus Duda V, MD;  [facility];  Service: Orthopedics;  Laterality: Right;  . STUMP REVISION Right 02/07/2017   Procedure: RIGHT HIP DISARTICULATION;  Surgeon: Nadara MustardMarcus Duda V, MD;  Location: [facility] OR;  Service: Orthopedics;  Laterality: Right;  . TOE AMPUTATION     multiple toes amputated.     Allergies:   Allergies  Allergen Reactions  . Vancomycin Other (See Comments)    Acute kidney injury     Social History:  reports that he has been smoking Cigarettes.  He  has a 48.00 pack-year smoking history. He has never used smokeless tobacco. He reports that he does not drink alcohol or use drugs.   Family History: Family History  Problem Relation Age of Onset  . Cancer Mother   . Congestive Heart Failure Father     Unacceptable: Noncontributory, unremarkable, or negative. Acceptable: Family history reviewed and not pertinent (If you reviewed it)   Physical Exam: Vitals:   02/19/17 0804 02/19/17 1041 02/19/17 1241 02/19/17 1244  BP: (!) 91/51  (!) 101/58   Pulse:    97   Resp:      Temp:  99.5 F (37.5 C)  98.8 F (37.1 C)  TempSrc:  Oral  Oral  SpO2:      Weight:      Height:        Constitutional: Alert and awake, oriented x3, not in any acute distress. Eyes: PERLA, EOMI, irises appear normal, anicteric sclera,  ENMT: external ears and nose appear normal, normal hearing            Lips appears normal, oropharynx mucosa, tongue, posterior pharynx appear normal  Neck: neck appears normal, no masses, normal ROM, no thyromegaly, no JVD  CVS: S1-S2 clear, no murmur rubs or gallops, no LE edema, normal pedal pulses  Respiratory:  clear to auscultation bilaterally, no wheezing, rales or rhonchi. Respiratory effort normal. No accessory muscle use.  Abdomen: soft nontender, nondistended, normal bowel sounds, no hepatosplenomegaly, no hernias  Musculoskeletal: : no cyanosis, clubbing or edema noted bilaterally; status post BKA on the right side and BKA on the left Neuro: Cranial nerves II-XII intact, strength, sensation, reflexes Psych: judgement and insight appear normal, stable mood and affect, mental status Skin: no rashes or lesions or ulcers, no induration or nodules     Data reviewed:  I have personally reviewed following labs and imaging studies Labs:  CBC:  Recent Labs Lab 02/16/17 1205 02/17/17 0401 02/18/17 0318 02/19/17 0816  WBC 16.6* 19.1* 11.5* 18.0*  HGB 10.3* 6.5* 6.3* 8.3*  HCT 30.8* 19.4* 18.8* 25.6*  MCV 82.4 80.8 81.4 81.5  PLT 652* 508* 458* 485*    Basic Metabolic Panel:  Recent Labs Lab 02/16/17 1205 02/19/17 0816  NA 132* 131*  K 4.0 3.7  CL 96* 97*  CO2 27 28  GLUCOSE 181* 182*  BUN 11 8  CREATININE 0.67 0.74  CALCIUM 8.9 7.8*   GFR Estimated Creatinine Clearance: 133.2 mL/min (by C-G formula based on SCr of 0.74 mg/dL). Liver Function Tests:  Recent Labs Lab 02/19/17 0816  AST 32  ALT 20  ALKPHOS 323*  BILITOT 0.3  PROT 6.5  ALBUMIN 1.8*    Recent Labs Lab 02/18/17 1633 02/18/17 1948  02/19/17 0327 02/19/17 0621 02/19/17 1141  GLUCAP 117* 149* 188* 168* 210*   Urinalysis    Component Value Date/Time   COLORURINE STRAW (A) 02/17/2017 2139   APPEARANCEUR CLEAR 02/17/2017 2139   LABSPEC 1.005 02/17/2017 2139   PHURINE 6.0 02/17/2017 2139   GLUCOSEU NEGATIVE 02/17/2017 2139   HGBUR NEGATIVE 02/17/2017 2139   BILIRUBINUR NEGATIVE 02/17/2017 2139   KETONESUR NEGATIVE 02/17/2017 2139   PROTEINUR 30 (A) 02/17/2017 2139   UROBILINOGEN 0.2 02/03/2013 0227   NITRITE NEGATIVE 02/17/2017 2139   LEUKOCYTESUR NEGATIVE 02/17/2017 2139     Microbiology Recent Results (from the past 240 hour(s))  Aerobic/Anaerobic Culture (surgical/deep wound)     Status: None (Preliminary result)   Collection Time: 02/16/17  2:55 PM  Result Value Ref Range Status  Specimen Description WOUND RIGHT HIP  Final   Special Requests PATIENT ON FOLLOWING ANCEF  Final   Gram Stain   Final    ABUNDANT WBC PRESENT, PREDOMINANTLY MONONUCLEAR ABUNDANT GRAM POSITIVE COCCI IN CLUSTERS IN PAIRS ABUNDANT GRAM NEGATIVE RODS    Culture   Final    ABUNDANT GROUP B STREP(S.AGALACTIAE)ISOLATED TESTING AGAINST S. AGALACTIAE NOT ROUTINELY PERFORMED DUE TO PREDICTABILITY OF AMP/PEN/VAN SUSCEPTIBILITY. ABUNDANT PREVOTELLA BIVIA BETA LACTAMASE POSITIVE    Report Status PENDING  Incomplete       Inpatient Medications:   Scheduled Meds: . sodium chloride   Intravenous Once  . aspirin EC  325 mg Oral Daily  . ceFEPime (MAXIPIME) IV  1 g Intravenous Q8H  . docusate sodium  100 mg Oral BID  . gabapentin  300 mg Oral TID  . glipiZIDE  2.5 mg Oral QAC breakfast  . insulin aspart  0-15 Units Subcutaneous TID WC  . insulin aspart  4 Units Subcutaneous TID WC  . insulin glargine  10 Units Subcutaneous QHS  . lisinopril  10 mg Oral Daily  . nicotine  21 mg Transdermal Daily  . sodium chloride  1,000 mL Intravenous Once   And  . sodium chloride  1,000 mL Intravenous Once   Continuous Infusions: .  sodium chloride 10 mL/hr (02/18/17 1921)     Radiological Exams on Admission: Dg Chest Port 1 View  Result Date: 02/19/2017 CLINICAL DATA:  Fevers EXAM: PORTABLE CHEST 1 VIEW COMPARISON:  08/02/2012, 02/03/2013 FINDINGS: The heart size and mediastinal contours are within normal limits. Both lungs are clear. The visualized skeletal structures are unremarkable. IMPRESSION: No active disease. Electronically Signed   By: Alcide Clever M.D.   On: 02/19/2017 14:48    Impression/Recommendations Principal Problem:   Severe sepsis with septic shock (HCC) Active Problems:   Type 2 diabetes mellitus with complication, with long-term current use of insulin (HCC)   Tobacco abuse   HTN (hypertension)   History of disarticulation of right hip   Postoperative infection   Severe sepsis with septic shock most likely secondary to recurrent joint infection, surgical stump abscess - patient is hypotensive, febrile, tachycardic and has elevated WBC's count with known source of infection IV fluid resuscitation initiated We will change antibiotics from Ancef to cefepime, Dr. Lajoyce Corners was informed about this change Patient refused vancomycin Continue to cycle blood pressure, monitor  white blood cells count and lactic acid, blood cultures are in process Last wound culture grew strep of the lactate and Gram stain showed mixed gram-negative and gram-positive flora   Diabetes mellitus,uncontrolled, last hemoglobin A1c was 14.5% in 2017 Patient doesn't have primary physician Will request case management/social worker to set up a follow-up appointment Continue current treatment, maintaining carb controlled diet  Historical diagnosis of hypertension - currently hypotensive due to sepsis Parameters are written to hold lisinopril   Ongoing tobacco use Consider NicoDerm patch needed  Thank you for this consultation.  Our [facility] hospitalist team will follow the patient with you.   Time Spent: 75 minutes  Raymon Mutton M.D. [facility] 02/19/2017, 3:29 PM

## 2017-02-19 NOTE — Progress Notes (Signed)
Physical Therapy Treatment Patient Details Name: Derrick [name] MRN: [id number] DOB: 10/24/1971 Today's Date: 02/19/2017    History of Present Illness Presented in Ortho follow up with abscess post R hip disarticulation; now s/p I&D and wound VAC placement;  has a past medical history of Anemia; Arthritis; Diabetes mellitus; GERD (gastroesophageal reflux disease); Neuropathy (HCC); Peripheral neuropathy (HCC); Pneumonia; and Prostatitis. Past surgical history includes multiple amputations, including L BKA (has a prosthesis)    PT Comments    Patient declined OOB transfers despite max encouragement. Worked on sitting balance EOB this session. Lightheadedness with sitting EOB see general comments below for BP. Continue to progress as tolerated.    Follow Up Recommendations  CIR (if pt d/c home will need HHPT/OT/aide/RN )     Equipment Recommendations  Wheelchair (measurements PT);Wheelchair cushion (measurements PT)    Recommendations for Other Services OT consult     Precautions / Restrictions Precautions Precautions: Fall Precaution Comments: bilateral amputee Required Braces or Orthoses: Other Brace/Splint Other Brace/Splint: Lt BKA prosthesis     Mobility  Bed Mobility Overal bed mobility: Needs Assistance Bed Mobility: Supine to Sit;Sit to Supine     Supine to sit: Mod assist Sit to supine: Min assist   General bed mobility comments: cues for technique and use of hand rails; assist to elevate trunk; pt with good ability to position in bed and use bilat UE for scooting/bridging  Transfers                 General transfer comment: declined transfer OOB despite MAX encouragement and education on benefits of OOB mobility   Ambulation/Gait                 Stairs            Wheelchair Mobility    Modified Rankin (Stroke Patients Only)       Balance     Sitting balance-Leahy Scale: Poor Sitting balance - Comments: pt able to maintain static  sitting with single UE support but unable to maintain balance with challenges                            Cognition Arousal/Alertness: Awake/alert Behavior During Therapy: WFL for tasks assessed/performed;Agitated Overall Cognitive Status: Within Functional Limits for tasks assessed                      Exercises      General Comments General comments (skin integrity, edema, etc.): pt with c/o lightheadedness in sitting; BP supine 104/74, in sitting 95/64, and after sitting ~10 mins 107/69      Pertinent Vitals/Pain Pain Assessment: Faces Faces Pain Scale: Hurts a little bit Pain Location: Rt buttock  Pain Descriptors / Indicators: Sore Pain Intervention(s): Monitored during session;Premedicated before session;Repositioned    Home Living                      Prior Function            PT Goals (current goals can now be found in the care plan section) Acute Rehab PT Goals Patient Stated Goal: Wants to feel better PT Goal Formulation: With patient Time For Goal Achievement: 03/04/17 Potential to Achieve Goals: Good Progress towards PT goals: Progressing toward goals    Frequency    Min 3X/week      PT Plan Current plan remains appropriate    Co-evaluation  End of Session   Activity Tolerance: Patient tolerated treatment well Patient left: in bed;with call bell/phone within reach;with family/visitor present Nurse Communication: Mobility status PT Visit Diagnosis: Muscle weakness (generalized) (M62.81);Pain Pain - Right/Left: Right Pain - part of body: Hip     Time: 1610-9604 PT Time Calculation (min) (ACUTE ONLY): 33 min  Charges:  $Therapeutic Activity: 23-37 mins                    G Codes:       Derek [name], PTA Pager: [phone]   02/19/2017, 4:16 PM

## 2017-02-20 ENCOUNTER — Inpatient Hospital Stay (HOSPITAL_COMMUNITY): Payer: Medicare Other | Admitting: Certified Registered Nurse Anesthetist

## 2017-02-20 ENCOUNTER — Encounter (HOSPITAL_COMMUNITY)
Admission: RE | Disposition: A | Discharge status: Home Health | Payer: Self-pay | Source: Ambulatory Visit | Attending: Orthopedic Surgery

## 2017-02-20 DIAGNOSIS — A419 Sepsis, unspecified organism: Secondary | ICD-10-CM

## 2017-02-20 DIAGNOSIS — R6521 Severe sepsis with septic shock: Secondary | ICD-10-CM

## 2017-02-20 HISTORY — PX: INCISION AND DRAINAGE HIP: SHX1801

## 2017-02-20 LAB — GLUCOSE, CAPILLARY
GLUCOSE-CAPILLARY: 202 mg/dL — AB (ref 65–99)
GLUCOSE-CAPILLARY: 91 mg/dL (ref 65–99)
GLUCOSE-CAPILLARY: 67 mg/dL (ref 65–99)
GLUCOSE-CAPILLARY: 75 mg/dL (ref 65–99)
Glucose-Capillary: 84 mg/dL (ref 65–99)
Glucose-Capillary: 101 mg/dL — ABNORMAL HIGH (ref 65–99)
Glucose-Capillary: 53 mg/dL — ABNORMAL LOW (ref 65–99)
Glucose-Capillary: 116 mg/dL — ABNORMAL HIGH (ref 65–99)

## 2017-02-20 SURGERY — IRRIGATION AND DEBRIDEMENT HIP
Anesthesia: General | Laterality: Right

## 2017-02-20 MED ORDER — PHENYLEPHRINE 40 MCG/ML (10ML) SYRINGE FOR IV PUSH (FOR BLOOD PRESSURE SUPPORT)
PREFILLED_SYRINGE | INTRAVENOUS | Status: AC
  Filled 2017-02-20: qty 20

## 2017-02-20 MED ORDER — MIDAZOLAM HCL 5 MG/5ML IJ SOLN
INTRAMUSCULAR | Status: DC | PRN
  Administered 2017-02-20: 2 mg via INTRAVENOUS

## 2017-02-20 MED ORDER — DIPHENHYDRAMINE HCL 25 MG PO CAPS
50.0000 mg | ORAL_CAPSULE | Freq: Four times a day (QID) | ORAL | Status: DC | PRN
  Administered 2017-02-21 – 2017-02-25 (×5): 50 mg via ORAL
  Filled 2017-02-20 (×5): qty 2

## 2017-02-20 MED ORDER — TRANEXAMIC ACID 1000 MG/10ML IV SOLN
2000.0000 mg | INTRAVENOUS | Status: AC
  Filled 2017-02-20 (×2): qty 20

## 2017-02-20 MED ORDER — ONDANSETRON HCL 4 MG/2ML IJ SOLN
INTRAMUSCULAR | Status: DC | PRN
  Administered 2017-02-20: 4 mg via INTRAVENOUS

## 2017-02-20 MED ORDER — POTASSIUM CHLORIDE 2 MEQ/ML IV SOLN
INTRAVENOUS | Status: DC
  Filled 2017-02-20: qty 1000

## 2017-02-20 MED ORDER — LACTATED RINGERS IV SOLN
INTRAVENOUS | Status: DC
Start: 2017-02-20 — End: 2017-02-26
  Administered 2017-02-20 – 2017-02-22 (×2): via INTRAVENOUS

## 2017-02-20 MED ORDER — PHENYLEPHRINE HCL 10 MG/ML IJ SOLN
INTRAMUSCULAR | Status: DC | PRN
  Administered 2017-02-20: 120 ug via INTRAVENOUS
  Administered 2017-02-20: 160 ug via INTRAVENOUS
  Administered 2017-02-20: 120 ug via INTRAVENOUS
  Administered 2017-02-20: 160 ug via INTRAVENOUS
  Administered 2017-02-20: 200 ug via INTRAVENOUS
  Administered 2017-02-20 (×2): 120 ug via INTRAVENOUS
  Administered 2017-02-20: 200 ug via INTRAVENOUS

## 2017-02-20 MED ORDER — DEXTROSE-NACL 5-0.45 % IV SOLN: INTRAVENOUS | Status: DC

## 2017-02-20 MED ORDER — DEXMEDETOMIDINE HCL IN NACL 200 MCG/50ML IV SOLN
INTRAVENOUS | Status: DC | PRN
  Administered 2017-02-20: 8 ug via INTRAVENOUS

## 2017-02-20 MED ORDER — LIDOCAINE 2% (20 MG/ML) 5 ML SYRINGE
INTRAMUSCULAR | Status: DC | PRN
  Administered 2017-02-20: 60 mg via INTRAVENOUS

## 2017-02-20 MED ORDER — PROPOFOL 10 MG/ML IV BOLUS
INTRAVENOUS | Status: AC
  Filled 2017-02-20: qty 20

## 2017-02-20 MED ORDER — DEXTROSE 50 % IV SOLN
25.0000 mL | Freq: Once | INTRAVENOUS | Status: AC
  Administered 2017-02-20: 25 mL via INTRAVENOUS

## 2017-02-20 MED ORDER — HYDROMORPHONE HCL 1 MG/ML IJ SOLN: 0.2500 mg | INTRAMUSCULAR | Status: DC | PRN

## 2017-02-20 MED ORDER — FENTANYL CITRATE (PF) 100 MCG/2ML IJ SOLN
INTRAMUSCULAR | Status: AC
  Filled 2017-02-20: qty 4

## 2017-02-20 MED ORDER — INSULIN ASPART 100 UNIT/ML ~~LOC~~ SOLN
0.0000 [IU] | Freq: Three times a day (TID) | SUBCUTANEOUS | Status: DC
  Administered 2017-02-21 (×2): 3 [IU] via SUBCUTANEOUS
  Administered 2017-02-21: 11 [IU] via SUBCUTANEOUS
  Administered 2017-02-22: 5 [IU] via SUBCUTANEOUS
  Administered 2017-02-22: 3 [IU] via SUBCUTANEOUS
  Administered 2017-02-22: 5 [IU] via SUBCUTANEOUS
  Administered 2017-02-24 (×2): 2 [IU] via SUBCUTANEOUS

## 2017-02-20 MED ORDER — MIDAZOLAM HCL 2 MG/2ML IJ SOLN
INTRAMUSCULAR | Status: AC
  Filled 2017-02-20: qty 2

## 2017-02-20 MED ORDER — DEXTROSE 50 % IV SOLN
INTRAVENOUS | Status: AC
  Administered 2017-02-20: 25 mL
  Filled 2017-02-20: qty 50

## 2017-02-20 MED ORDER — PHENYLEPHRINE 40 MCG/ML (10ML) SYRINGE FOR IV PUSH (FOR BLOOD PRESSURE SUPPORT)
PREFILLED_SYRINGE | INTRAVENOUS | Status: AC
  Filled 2017-02-20: qty 10

## 2017-02-20 MED ORDER — SODIUM CHLORIDE 0.9 % IV SOLN
INTRAVENOUS | Status: DC | PRN
  Administered 2017-02-20 (×2): 2000 mg via TOPICAL

## 2017-02-20 MED ORDER — PROPOFOL 10 MG/ML IV BOLUS
INTRAVENOUS | Status: DC | PRN
  Administered 2017-02-20: 110 mg via INTRAVENOUS

## 2017-02-20 MED ORDER — DEXTROSE 50 % IV SOLN
INTRAVENOUS | Status: AC
  Administered 2017-02-20: 25 mL via INTRAVENOUS
  Filled 2017-02-20: qty 50

## 2017-02-20 MED ORDER — SODIUM CHLORIDE 0.9 % IR SOLN
Status: DC | PRN
  Administered 2017-02-20: 3000 mL

## 2017-02-20 MED ORDER — POTASSIUM CHLORIDE 2 MEQ/ML IV SOLN
INTRAVENOUS | Status: DC
  Administered 2017-02-20: 10:00:00 via INTRAVENOUS
  Filled 2017-02-20 (×14): qty 1000

## 2017-02-20 SURGICAL SUPPLY — 50 items
APL SKNCLS STERI-STRIP NONHPOA (GAUZE/BANDAGES/DRESSINGS) ×6
BENZOIN TINCTURE PRP APPL 2/3 (GAUZE/BANDAGES/DRESSINGS) ×12 IMPLANT
CANISTER WOUND CARE 500ML ATS (WOUND CARE) ×2 IMPLANT
CASSETTE VERAFLO VERALINK (MISCELLANEOUS) ×2 IMPLANT
COVER SURGICAL LIGHT HANDLE (MISCELLANEOUS) ×6 IMPLANT
DRAPE IMP U-DRAPE 54X76 (DRAPES) ×3 IMPLANT
DRAPE INCISE IOBAN 85X60 (DRAPES) ×4 IMPLANT
DRAPE ORTHO SPLIT 77X108 STRL (DRAPES) ×6
DRAPE SURG ORHT 6 SPLT 77X108 (DRAPES) ×2 IMPLANT
DRAPE U-SHAPE 47X51 STRL (DRAPES) ×3 IMPLANT
DRESSING VERAFLO CLEANSE CC (GAUZE/BANDAGES/DRESSINGS) IMPLANT
DRSG ADAPTIC 3X8 NADH LF (GAUZE/BANDAGES/DRESSINGS) ×1 IMPLANT
DRSG MEPILEX BORDER 4X8 (GAUZE/BANDAGES/DRESSINGS) ×3 IMPLANT
DRSG PAD ABDOMINAL 8X10 ST (GAUZE/BANDAGES/DRESSINGS) ×3 IMPLANT
DRSG VERAFLO CLEANSE CC (GAUZE/BANDAGES/DRESSINGS) ×3
DURAPREP 26ML APPLICATOR (WOUND CARE) ×3 IMPLANT
ELECT CAUTERY BLADE 6.4 (BLADE) IMPLANT
ELECT REM PT RETURN 9FT ADLT (ELECTROSURGICAL)
ELECTRODE REM PT RTRN 9FT ADLT (ELECTROSURGICAL) IMPLANT
GAUZE SPONGE 4X4 12PLY STRL (GAUZE/BANDAGES/DRESSINGS) ×1 IMPLANT
GLOVE BIOGEL PI IND STRL 9 (GLOVE) ×1 IMPLANT
GLOVE BIOGEL PI INDICATOR 9 (GLOVE) ×2
GLOVE SURG ORTHO 9.0 STRL STRW (GLOVE) ×3 IMPLANT
GOWN STRL REUS W/ TWL XL LVL3 (GOWN DISPOSABLE) ×2 IMPLANT
GOWN STRL REUS W/TWL XL LVL3 (GOWN DISPOSABLE) ×6
HANDPIECE INTERPULSE COAX TIP (DISPOSABLE)
IV SOD CHL 0.9% 1000ML (IV SOLUTION) ×2 IMPLANT
KIT BASIN OR (CUSTOM PROCEDURE TRAY) ×3 IMPLANT
KIT ROOM TURNOVER OR (KITS) ×3 IMPLANT
MANIFOLD NEPTUNE II (INSTRUMENTS) ×3 IMPLANT
NS IRRIG 1000ML POUR BTL (IV SOLUTION) ×3 IMPLANT
PACK TOTAL JOINT (CUSTOM PROCEDURE TRAY) ×3 IMPLANT
PACK UNIVERSAL I (CUSTOM PROCEDURE TRAY) ×3 IMPLANT
PAD ARMBOARD 7.5X6 YLW CONV (MISCELLANEOUS) ×6 IMPLANT
SET HNDPC FAN SPRY TIP SCT (DISPOSABLE) IMPLANT
SPONGE LAP 18X18 X RAY DECT (DISPOSABLE) ×5 IMPLANT
STAPLER VISISTAT 35W (STAPLE) ×3 IMPLANT
SUT ETHIBOND NAB CT1 #1 30IN (SUTURE) ×3 IMPLANT
SUT ETHILON 2 0 PSLX (SUTURE) ×6 IMPLANT
SUT SILK 2 0 (SUTURE) ×3
SUT SILK 2-0 18XBRD TIE 12 (SUTURE) IMPLANT
SUT VIC AB 1 CTX 36 (SUTURE) ×3
SUT VIC AB 1 CTX36XBRD ANBCTR (SUTURE) ×1 IMPLANT
SUT VIC AB 2-0 CT1 27 (SUTURE) ×3
SUT VIC AB 2-0 CT1 TAPERPNT 27 (SUTURE) ×1 IMPLANT
TOWEL OR 17X24 6PK STRL BLUE (TOWEL DISPOSABLE) ×3 IMPLANT
TOWEL OR 17X26 10 PK STRL BLUE (TOWEL DISPOSABLE) ×3 IMPLANT
TUBE ANAEROBIC SPECIMEN COL (MISCELLANEOUS) IMPLANT
UNDERPAD 30X30 (UNDERPADS AND DIAPERS) ×3 IMPLANT
WATER STERILE IRR 1000ML POUR (IV SOLUTION) ×3 IMPLANT

## 2017-02-20 NOTE — Anesthesia Preprocedure Evaluation (Addendum)
Anesthesia Evaluation  Patient identified by MRN, date of birth, ID band Patient awake    Reviewed: Allergy & Precautions, H&P , NPO status , Patient's Chart, lab work & pertinent test results  Airway Mallampati: II  TM Distance: >3 FB Neck ROM: Full    Dental no notable dental hx. (+) Poor Dentition, Dental Advisory Given   Pulmonary Current Smoker,    Pulmonary exam normal breath sounds clear to auscultation       Cardiovascular hypertension, Pt. on medications  Rhythm:Regular Rate:Normal     Neuro/Psych negative neurological ROS  negative psych ROS   GI/Hepatic Neg liver ROS, GERD  ,  Endo/Other  diabetes, Insulin Dependent  Renal/GU negative Renal ROS  negative genitourinary   Musculoskeletal  (+) Arthritis , Osteoarthritis,    Abdominal   Peds  Hematology negative hematology ROS (+) anemia ,   Anesthesia Other Findings   Reproductive/Obstetrics negative OB ROS                            Anesthesia Physical Anesthesia Plan  ASA: III  Anesthesia Plan: General   Post-op Pain Management:    Induction: Intravenous  Airway Management Planned: LMA  Additional Equipment:   Intra-op Plan:   Post-operative Plan: Extubation in OR  Informed Consent: I have reviewed the patients History and Physical, chart, labs and discussed the procedure including the risks, benefits and alternatives for the proposed anesthesia with the patient or authorized representative who has indicated his/her understanding and acceptance.   Dental advisory given  Plan Discussed with: CRNA  Anesthesia Plan Comments:        Anesthesia Quick Evaluation

## 2017-02-20 NOTE — Plan of Care (Signed)
Problem: Pain Managment: Goal: General experience of comfort will improve Outcome: Not Progressing Chronic pain  Problem: Skin Integrity: Goal: Risk for impaired skin integrity will decrease Outcome: Not Progressing Right stump wound no healing well, wound vac on at 100  Problem: Activity: Goal: Risk for activity intolerance will decrease Outcome: Progressing OOB to WC to BR, tolerates well  Problem: Bowel/Gastric: Goal: Will not experience complications related to bowel motility Outcome: Progressing No bowel issues, last BM 02/19/2017

## 2017-02-20 NOTE — Progress Notes (Signed)
Family Medicine Teaching Service CONSULT NOTE Intern Pager: [phone]  Patient name: Derrick RoseBobby L Sippel Medical record number: [id number] Date of birth: 08/02/1971 Age: 46 y.o. Gender: male  Primary Care Provider: Mickie HillierIan McKeag, MD  Pt Overview and Major Events to Date:  3/2: Revision of R hip disarticulation with placement of wound vac w/ Dr. Lajoyce Cornersuda  Assessment and Plan: [name] is a 46 year old male with a PMH of T2DM, HTN, tobacco use, L BKA, R AKA.  Severe sepsis with septic shock, improving: most likely secondary to recurrent joint infection. Patient hypotensive, febrile, tachycardic on admission with elevated WBC. Vitals improving with most recent BP 113/73, HR 94. Afebrile for the last 24 hours. WBC trending down from 18 > 15.2. - s/p 2L bolus 3/5. Can give additional boluses as needed. - Start MIVFs at 16100ml/hr while NPO today - Hold home Lisinopril.  Recurrent Joint Infection of R Hip: Underwent revision of R hip disarticulation with placement of wound vac on 3/2. Wound culture 3/2 growing GBS. - On Ancef 3/2-3/5, then switched to Cefepime 3/5. Switch to Zosyn, as wound cultures growing Prevotella and GBS. - Plan for wound irrigation today per ortho - Blood cultures pending  Normocytic Anemia: Hgb dropped 10.3 > 6.5 on 3/3. S/p 3 units pRBCs. Hgb 7.9. - Daily CBCs  Hypokalemia: K 3.3 yesterday. - Start MIVFs with 20mEq KCl x 12 hours - Check BMET in the AM  Hyponatremia: Appears to be chronic per chart review. Na 132. - Start D5NS at 15000ml/hr - Recheck Na in the AM  Diabetes mellitus,uncontrolled. Last hemoglobin A1c was 14.5% in 2017. Blood sugars have been 127-210. Takes NPH 7 units bid at home plus Glipizide 5mg . - Hold Lantus while NPO - Stop scheduled Novolog 4 u with meals, as Pt is NPO. - Change CBGs to q4hrs, continue mSSI - Stop glipizide while hospitalized  Tobacco use - Nicotine patch  FEN/GI: NPO for surgery today PPx: Holding anticoagulation for surgery  today  Disposition: Pending clinical improvement and ortho recs. PT recommending CIR on discharge.  Subjective:  Patient states that he is having right hip pain. States he does not want to go home with the wound vac. No fevers overnight.  Objective: Temp:  [97.7 F (36.5 C)-99.5 F (37.5 C)] 98.8 F (37.1 C) (03/06 0545) Pulse Rate:  [94-101] 94 (03/06 0545) Resp:  [16] 16 (03/06 0545) BP: (101-153)/(58-90) 113/73 (03/06 0545) SpO2:  [99 %-100 %] 99 % (03/06 0545) Physical Exam: General: Awake, alert, NAD Cardiovascular: RRR, no murmurs Respiratory: Normal work of breathing Abdomen: +BS, soft, non-tender, non-distended Extremities: s/p R hip disarticulation with wound vac in place, well-healed L BKA  Laboratory:  Recent Labs Lab 02/18/17 0318 02/19/17 0816 02/19/17 1537  WBC 11.5* 18.0* 15.2*  HGB 6.3* 8.3* 7.9*  HCT 18.8* 25.6* 23.8*  PLT 458* 485* 471*    Recent Labs Lab 02/16/17 1205 02/19/17 0816 02/19/17 1537  NA 132* 131* 132*  K 4.0 3.7 3.3*  CL 96* 97* 95*  CO2 27 28 28   BUN 11 8 7   CREATININE 0.67 0.74 0.68  CALCIUM 8.9 7.8* 7.6*  PROT  --  6.5 6.0*  BILITOT  --  0.3 0.3  ALKPHOS  --  323* 284*  ALT  --  20 22  AST  --  32 39  GLUCOSE 181* 182* 102*    Katy Gay Fillerodd Mayo, MD 02/20/2017, 9:00 AM PGY-2, [facility] FPTS Intern pager: [phone], text pages welcome

## 2017-02-20 NOTE — Op Note (Signed)
02/16/2017 - 02/20/2017  6:58 PM  PATIENT:  Derrick [name]    PRE-OPERATIVE DIAGNOSIS:  Abscess Right Hip  POST-OPERATIVE DIAGNOSIS:  Same  PROCEDURE: Excision skin soft tissue and muscle  RIGHT HIP Application of a vera flow wound VAC  SURGEON:  Nadara MustardMarcus V Kendrik Mcshan, MD  PHYSICIAN ASSISTANT:None ANESTHESIA:   General  PREOPERATIVE INDICATIONS:  Derrick [name] is a  46 y.o. male with a diagnosis of Abscess Right Hip who failed conservative measures and elected for surgical management.    The risks benefits and alternatives were discussed with the patient preoperatively including but not limited to the risks of infection, bleeding, nerve injury, cardiopulmonary complications, the need for revision surgery, among others, and the patient was willing to proceed.  OPERATIVE IMPLANTS: Vera flow wound VAC 60 mL instillation  OPERATIVE FINDINGS: No deep abscess  OPERATIVE PROCEDURE: Patient was brought to the operating room and underwent a general anesthetic. After adequate anesthesia obtained patient's right lower extremity was prepped using DuraPrep draped into a sterile field a timeout was called. The previous wound VAC sponge was removed. Further muscle and soft tissue and skin was excised with a 10 blade knife and Ronjair. This was debrided back to bleeding viable tissue. After cautery was used for hemostasis. A infusion wound VAC sponge was then packed within the wound the wound was closed over the sponge with 2-0 nylon. The joint was infused with 2 g of TXA. Ioban and drapes were applied after using benzoin. The wound VAC and good suction fit. Patient was extubated taken to PACU in stable condition.

## 2017-02-20 NOTE — Transfer of Care (Signed)
Immediate Anesthesia Transfer of Care Note  Patient: Derrick [name]  Procedure(s) Performed: Procedure(s): IRRIGATION AND DEBRIDEMENT RIGHT HIP (Right)  Patient Location: PACU  Anesthesia Type:General  Level of Consciousness: lethargic and responds to stimulation  Airway & Oxygen Therapy: Patient Spontanous Breathing and Patient connected to face mask oxygen  Post-op Assessment: Report given to RN  Post vital signs: Reviewed and stable  Last Vitals:  Vitals:   02/20/17 1456 02/20/17 1905  BP: 123/73   Pulse: 98   Resp: 18   Temp:  (P) 36.4 C    Last Pain:  Vitals:   02/20/17 1905  TempSrc:   PainSc: (P) 0-No pain      Patients Stated Pain Goal: 0 (02/20/17 0359)  Complications: No apparent anesthesia complications

## 2017-02-20 NOTE — Progress Notes (Signed)
Infection Prevention received call from nurse Crissy reporting pt upset about being placed on contact precautions. I consulted with Dr. Ninetta LightsHatcher from Infectious Disease and he agrees that contact precautions is appropriate and should be continued due to pt hx of MRSA, current infected wound, and high risk patient. Nurse Crissy notified of this via phone.

## 2017-02-20 NOTE — Progress Notes (Signed)
Patient ID: Derrick RoseBobby L Mccombs, male   DOB: 07/02/1971, 46 y.o.   MRN: [id number] The wound VAC was turned off this morning. When I discussed this with the nursing they stated that several times the patient or his girlfriend had turned off the wound VAC pump. I do not know how long the wound VAC pump has been turned off. When I turned the pump back on it had a good suction fit with no leaking. I discussed the patient that we will have to return to the operating room to replace the sponge and irrigate the wound due to the unknown amount of time this has been turned off. Patient is to be nothing by mouth from now until surgery approximately 5 PM.

## 2017-02-20 NOTE — Anesthesia Procedure Notes (Signed)
Procedure Name: LMA Insertion Date/Time: 02/20/2017 5:56 PM Performed by: Faustino CongressWHITE, Kelechi Orgeron TENA Nafisa Olds Pre-anesthesia Checklist: Patient identified, Emergency Drugs available, Suction available and Patient being monitored Patient Re-evaluated:Patient Re-evaluated prior to inductionOxygen Delivery Method: Circle System Utilized Preoxygenation: Pre-oxygenation with 100% oxygen Intubation Type: IV induction Ventilation: Mask ventilation without difficulty LMA: LMA inserted LMA Size: 5.0 Number of attempts: 1 Placement Confirmation: positive ETCO2 Tube secured with: Tape Dental Injury: Teeth and Oropharynx as per pre-operative assessment

## 2017-02-20 NOTE — Progress Notes (Signed)
Inpatient Diabetes Program Recommendations  AACE/ADA: New Consensus Statement on Inpatient Glycemic Control (2015)  Target Ranges:  Prepandial:   less than 140 mg/dL      Peak postprandial:   less than 180 mg/dL (1-2 hours)      Critically ill patients:  140 - 180 mg/dL   Results for Derrick [name], Derrick [name] (MRN [id number]) as of 02/20/2017 08:15  Ref. Range 02/19/2017 06:21 02/19/2017 11:41 02/19/2017 16:39 02/19/2017 20:50 02/20/2017 06:14  Glucose-Capillary Latest Ref Range: 65 - 99 mg/dL 782168 (H) 956210 (H) 213127 (H) 166 (H) 202 (H)    Admit with: Abscess R Hip/ Surgical Stump Abscess  History: DM  Home DM Meds: NPH Insulin- 7 units BID       Glipizide 2.5 mg daily  Current Insulin Orders: Lantus 10 units QHS        Novolog Moderate Correction Scale/ SSI (0-15 units) TID AC       Novolog 4 units TID with meals      Glipizide 2.5 mg daily      MD- Note patient NPO today for surgical intervention.  Fasting glucose 202 mg/dl this AM.  Please consider increasing Lantus to 15 units QHS (takes a total of 14 units NPH insulin at home)      --Will follow patient during hospitalization--  Ambrose FinlandJeannine Johnston Emrey Thornley RN, MSN, CDE Diabetes Coordinator Inpatient Glycemic Control Team Team Pager: [phone] (8a-5p)

## 2017-02-21 ENCOUNTER — Encounter (HOSPITAL_COMMUNITY): Payer: Self-pay | Admitting: Orthopedic Surgery

## 2017-02-21 DIAGNOSIS — Z720 Tobacco use: Secondary | ICD-10-CM

## 2017-02-21 DIAGNOSIS — T814XXD Infection following a procedure, subsequent encounter: Secondary | ICD-10-CM

## 2017-02-21 LAB — TYPE AND SCREEN
ABO/RH(D): A POS
ANTIBODY SCREEN: NEGATIVE
UNIT DIVISION: 0
UNIT DIVISION: 0
Unit division: 0
Unit division: 0

## 2017-02-21 LAB — BPAM RBC
BLOOD PRODUCT EXPIRATION DATE: 201803142359
BLOOD PRODUCT EXPIRATION DATE: 201803252359
Blood Product Expiration Date: 201803102359
Blood Product Expiration Date: 201803252359
ISSUE DATE / TIME: 201803031026
ISSUE DATE / TIME: 201803031620
ISSUE DATE / TIME: 201803042306
ISSUE DATE / TIME: 201803050249
UNIT TYPE AND RH: 6200
UNIT TYPE AND RH: 6200
UNIT TYPE AND RH: 6200
Unit Type and Rh: 6200

## 2017-02-21 LAB — CBC
HCT: 24.9 % — ABNORMAL LOW (ref 39.0–52.0)
HEMOGLOBIN: 7.9 g/dL — AB (ref 13.0–17.0)
MCH: 26.2 pg (ref 26.0–34.0)
MCHC: 31.7 g/dL (ref 30.0–36.0)
MCV: 82.7 fL (ref 78.0–100.0)
PLATELETS: 646 10*3/uL — AB (ref 150–400)
RBC: 3.01 MIL/uL — AB (ref 4.22–5.81)
RDW: 14.8 % (ref 11.5–15.5)
WBC: 12.2 10*3/uL — AB (ref 4.0–10.5)

## 2017-02-21 LAB — AEROBIC/ANAEROBIC CULTURE (SURGICAL/DEEP WOUND)

## 2017-02-21 LAB — AEROBIC/ANAEROBIC CULTURE W GRAM STAIN (SURGICAL/DEEP WOUND)

## 2017-02-21 LAB — GLUCOSE, CAPILLARY
GLUCOSE-CAPILLARY: 166 mg/dL — AB (ref 65–99)
Glucose-Capillary: 192 mg/dL — ABNORMAL HIGH (ref 65–99)
Glucose-Capillary: 318 mg/dL — ABNORMAL HIGH (ref 65–99)
Glucose-Capillary: 187 mg/dL — ABNORMAL HIGH (ref 65–99)
Glucose-Capillary: 233 mg/dL — ABNORMAL HIGH (ref 65–99)

## 2017-02-21 LAB — BASIC METABOLIC PANEL
ANION GAP: 10 (ref 5–15)
BUN: <5 mg/dL — ABNORMAL LOW (ref 6–20)
CHLORIDE: 97 mmol/L — AB (ref 101–111)
CO2: 26 mmol/L (ref 22–32)
Calcium: 8.4 mg/dL — ABNORMAL LOW (ref 8.9–10.3)
Creatinine, Ser: 0.68 mg/dL (ref 0.61–1.24)
Glucose, Bld: 293 mg/dL — ABNORMAL HIGH (ref 65–99)
POTASSIUM: 4.4 mmol/L (ref 3.5–5.1)
SODIUM: 133 mmol/L — AB (ref 135–145)

## 2017-02-21 MED ORDER — PIPERACILLIN-TAZOBACTAM 3.375 G IVPB
3.3750 g | Freq: Three times a day (TID) | INTRAVENOUS | Status: DC
  Administered 2017-02-21 – 2017-02-25 (×14): 3.375 g via INTRAVENOUS
  Filled 2017-02-21 (×18): qty 50

## 2017-02-21 MED ORDER — INSULIN GLARGINE 100 UNIT/ML ~~LOC~~ SOLN
20.0000 [IU] | Freq: Every day | SUBCUTANEOUS | Status: DC
Start: 2017-02-21 — End: 2017-02-22
  Administered 2017-02-21: 20 [IU] via SUBCUTANEOUS
  Filled 2017-02-21: qty 0.2

## 2017-02-21 MED ORDER — HYDROXYZINE HCL 25 MG PO TABS
25.0000 mg | ORAL_TABLET | Freq: Three times a day (TID) | ORAL | Status: DC | PRN
  Administered 2017-02-21 – 2017-02-25 (×8): 25 mg via ORAL
  Filled 2017-02-21 (×8): qty 1

## 2017-02-21 MED ORDER — INSULIN GLARGINE 100 UNIT/ML ~~LOC~~ SOLN
10.0000 [IU] | Freq: Every day | SUBCUTANEOUS | Status: DC
  Filled 2017-02-21: qty 0.1

## 2017-02-21 MED ORDER — TRAZODONE HCL 50 MG PO TABS
50.0000 mg | ORAL_TABLET | Freq: Every day | ORAL | Status: DC
  Administered 2017-02-21 – 2017-02-25 (×5): 50 mg via ORAL
  Filled 2017-02-21 (×5): qty 1

## 2017-02-21 MED ORDER — INSULIN GLARGINE 100 UNIT/ML ~~LOC~~ SOLN
10.0000 [IU] | Freq: Once | SUBCUTANEOUS | Status: AC
  Administered 2017-02-21: 10 [IU] via SUBCUTANEOUS
  Filled 2017-02-21: qty 0.1

## 2017-02-21 MED ORDER — HEPARIN SODIUM (PORCINE) 5000 UNIT/ML IJ SOLN: 5000.0000 [IU] | Freq: Three times a day (TID) | INTRAMUSCULAR | Status: DC

## 2017-02-21 MED ORDER — HEPARIN SODIUM (PORCINE) 5000 UNIT/ML IJ SOLN
5000.0000 [IU] | Freq: Three times a day (TID) | INTRAMUSCULAR | Status: AC
  Administered 2017-02-21 – 2017-02-22 (×3): 5000 [IU] via SUBCUTANEOUS
  Filled 2017-02-21 (×5): qty 1

## 2017-02-21 MED ORDER — PIPERACILLIN-TAZOBACTAM 3.375 G IVPB: 3.3750 g | Freq: Three times a day (TID) | INTRAVENOUS | Status: DC

## 2017-02-21 NOTE — Anesthesia Postprocedure Evaluation (Addendum)
Anesthesia Post Note  Patient: Derrick [name]  Procedure(s) Performed: Procedure(s) (LRB): IRRIGATION AND DEBRIDEMENT RIGHT HIP (Right)  Patient location during evaluation: PACU Anesthesia Type: General Level of consciousness: awake and alert Pain management: pain level controlled Vital Signs Assessment: post-procedure vital signs reviewed and stable Respiratory status: spontaneous breathing, nonlabored ventilation, respiratory function stable and patient connected to nasal cannula oxygen Cardiovascular status: blood pressure returned to baseline and stable Postop Assessment: no signs of nausea or vomiting Anesthetic complications: no       Last Vitals:  Vitals:   02/20/17 2030 02/21/17 0519  BP: 108/65 121/78  Pulse: 95 95  Resp: 12 12  Temp: 36.6 C 36.9 C    Last Pain:  Vitals:   02/21/17 0519  TempSrc: Oral  PainSc:                  [name]

## 2017-02-21 NOTE — Progress Notes (Signed)
Family Medicine Teaching Service CONSULT NOTE Intern Pager: [phone]  Patient name: Derrick RoseBobby L [name] Medical record number: [id number] Date of birth: 05/31/1971 Age: 46 y.o. Gender: male  Primary Care Provider: Mickie HillierIan McKeag, MD  Pt Overview and Major Events to Date:  3/2: Revision of R hip disarticulation with placement of wound vac w/ Dr. Lajoyce Cornersuda  Assessment and Plan: Derrick [name] is a 46 year old male with a PMH of T2DM, HTN, tobacco use, L BKA, R AKA.  Recurrent Joint Infection of R Hip: Patient or his GF shut off woundvac overnight 3/5 and on 3/6 went to OR to replace sponge and have debridement and irrigation of wound and now woundvac back in place.  - On Ancef 3/2-3/5, then switched to Cefepime 3/5. Switch to Zosyn, as wound cultures growing Prevotella and GBS. - Plan for for wound irrigation on 3/9 - Blood cultures- NGTD  Severe sepsis with septic shock, resolved: Continues to be afebrile and leukocytosis trending downward to 12.5 from 15.2. Euvolemic volume status.  - SLIV and continue to encourage PO intake - Holding home Lisinopril.  Normocytic Anemia, stable: Hgb dropped 10.3 > 6.5 on 3/3. S/p 3 units pRBCs. Hgb 7.9. Stable this AM at 7.9 post procedure.  - Daily CBCs   Hypokalemia, resolved: K 3.3 yesterday up to 4.4 this AM. - Check BMET in the AM  Hyponatremia: Appears to be chronic per chart review. Na 132.  - encouraging PO intake - Recheck Na in the AM  Diabetes mellitus,uncontrolled. Last hemoglobin A1c was 14.5% in 2017. AM CBG to 318. Order placed to receive 10U lantus this afternoon -start  lantus 20U at bedtime - restarted scheduled Novolog 4 u with meals - continue mSSI - Stop glipizide while hospitalized  Tobacco use - Nicotine patch  FEN/GI: heart healthy diet PPx: subQ heparain today, will hold tomorrow  Disposition: PT recommending CIR. Plan to go to OR for irrigation on 3/9. Will need to clarify if patient wants CIR. Otherwise can go home this weekend  after debridement.   Subjective:  Patient states that he is having right hip pain. Also complaining of "anger" issues and having trouble sleeping. Denies CP, SOB, NV or diarrhea. Did have a BM yesterday.   Objective: Temp:  [97.6 F (36.4 C)-98.4 F (36.9 C)] 98.4 F (36.9 C) (03/07 0519) Pulse Rate:  [84-98] 95 (03/07 0519) Resp:  [8-18] 12 (03/07 0519) BP: (108-127)/(65-83) 121/78 (03/07 0519) SpO2:  [100 %] 100 % (03/07 0519) Physical Exam: General: Awake, alert, NAD Cardiovascular: RRR, no murmurs Respiratory: Normal work of breathing Abdomen: +BS, soft, non-tender, non-distended Extremities: s/p R hip disarticulation with wound vac in place, well-healed L BKA  Laboratory:  Recent Labs Lab 02/19/17 0816 02/19/17 1537 02/21/17 0653  WBC 18.0* 15.2* 12.2*  HGB 8.3* 7.9* 7.9*  HCT 25.6* 23.8* 24.9*  PLT 485* 471* 646*    Recent Labs Lab 02/16/17 1205 02/19/17 0816 02/19/17 1537  NA 132* 131* 132*  K 4.0 3.7 3.3*  CL 96* 97* 95*  CO2 27 28 28   BUN 11 8 7   CREATININE 0.67 0.74 0.68  CALCIUM 8.9 7.8* 7.6*  PROT  --  6.5 6.0*  BILITOT  --  0.3 0.3  ALKPHOS  --  323* 284*  ALT  --  20 22  AST  --  32 39  GLUCOSE 181* 182* 102*    Renne Muscaaniel L Logyn Dedominicis, MD 02/21/2017, 9:12 AM PGY-1, [facility] FPTS Intern pager: [phone], text pages welcome

## 2017-02-21 NOTE — Progress Notes (Signed)
Inpatient Diabetes Program Recommendations  AACE/ADA: New Consensus Statement on Inpatient Glycemic Control (2015)  Target Ranges:  Prepandial:   less than 140 mg/dL      Peak postprandial:   less than 180 mg/dL (1-2 hours)      Critically ill patients:  140 - 180 mg/dL   Lab Results  Component Value Date   GLUCAP 318 (H) 02/21/2017   HGBA1C 14.5 (H) 12/13/2016   Noted that patient experienced 2 episodes of hypoglycemia. Noted that meal coverage had been given along with Glipizide yesterday.  Noted Fasting CBG at 0600 this am high (318 mg/dL).  Inpatient Diabetes Program Recommendations:   Insulin - Basal: Please consider Lantus 10 units, which affects fasting CBG's.  Oral Agents: Noted Glipizide has been discontinued.  Thank you,  Kristine LineaKaren Eulon Allnutt, RN, MSN Diabetes Coordinator Inpatient Diabetes Program [phone] (Team Pager)

## 2017-02-21 NOTE — Progress Notes (Signed)
Patient ID: Derrick [name], male   DOB: 01/13/1971, 46 y.o.   MRN: [id number] Postoperative day 1 irrigation debridement hip disarticulation on the right. There was no hyper granulation tissue further ischemic muscle was resected. Wound VAC was placed this had a good suction fit continue with irrigation plan for return to the operating room on Friday. Once we start developing granulation tissue in the wound bed we can close the wound. Continue vera flow wound VAC.

## 2017-02-21 NOTE — Progress Notes (Signed)
Physical Therapy Treatment Patient Details Name: Derrick [name] MRN: [id number] DOB: Jul 11, 1971 Today's Date: 02/21/2017    History of Present Illness Presented in Ortho follow up with abscess post R hip disarticulation; now s/p I&D and wound VAC placement;  has a past medical history of Anemia; Arthritis; Diabetes mellitus; GERD (gastroesophageal reflux disease); Neuropathy (HCC); Peripheral neuropathy (HCC); Pneumonia; and Prostatitis. Past surgical history includes multiple amputations, including L BKA (has a prosthesis)    PT Comments    Patient required mod A for mobility this session. Pt expressed that he would like to go to CIR prior to going home given insurance approval. Pt encouraged to increase OOB activity with therapy and nursing staff. Pt is very anxious about mobility due to fall at home with RW. Pt will benefit from further skilled PT services to maximize independence and safety with mobility.    Follow Up Recommendations  CIR     Equipment Recommendations  Wheelchair (measurements PT);Wheelchair cushion (measurements PT)    Recommendations for Other Services OT consult     Precautions / Restrictions Precautions Precautions: Fall Precaution Comments: bilateral amputee Required Braces or Orthoses: Other Brace/Splint Other Brace/Splint: Lt BKA prosthesis     Mobility  Bed Mobility Overal bed mobility: Needs Assistance Bed Mobility: Supine to Sit     Supine to sit: Mod assist     General bed mobility comments: assist to elevate trunk into sitting; cues for technique  Transfers Overall transfer level: Needs assistance Equipment used: None Transfers: Squat Pivot Transfers     Squat pivot transfers: From elevated surface;Mod assist     General transfer comment: assist to power up into standing and maintain balance for transfer; cues for sequnecing, technique, and hand placement; pt refused to use RW due to previous fall with RW at home  Ambulation/Gait                 Stairs            Wheelchair Mobility    Modified Rankin (Stroke Patients Only)       Balance Overall balance assessment: Needs assistance Sitting-balance support: Bilateral upper extremity supported Sitting balance-Leahy Scale: Poor Sitting balance - Comments: pt able to maintain static sitting with single UE support but unable to maintain balance with challenges     Standing balance-Leahy Scale: Poor                      Cognition Arousal/Alertness: Awake/alert Behavior During Therapy: WFL for tasks assessed/performed;Agitated Overall Cognitive Status: Within Functional Limits for tasks assessed                 General Comments: pt is easily agitated and very anxious about mobility    Exercises      General Comments General comments (skin integrity, edema, etc.): friend present; pt c/o dizziness in sitting; vitals WNL; therapist discussed at length the importance of OOB mobility      Pertinent Vitals/Pain Pain Assessment: Faces Faces Pain Scale: Hurts a little bit Pain Location: Rt buttock  Pain Descriptors / Indicators: Sore Pain Intervention(s): Monitored during session;Premedicated before session;Repositioned    Home Living                      Prior Function            PT Goals (current goals can now be found in the care plan section) Acute Rehab PT Goals PT Goal Formulation: With patient Time For  Goal Achievement: 03/04/17 Potential to Achieve Goals: Good Progress towards PT goals: Progressing toward goals    Frequency    Min 3X/week      PT Plan Current plan remains appropriate    Co-evaluation             End of Session Equipment Utilized During Treatment: Gait belt Activity Tolerance: Patient tolerated treatment well Patient left: with call bell/phone within reach;with family/visitor present;in chair;Other (comment) (pt and friend instructed to call nursing staff to get up) Nurse  Communication: Mobility status PT Visit Diagnosis: Muscle weakness (generalized) (M62.81);Pain Pain - Right/Left: Right Pain - part of body: Hip     Time: 1520-1610 PT Time Calculation (min) (ACUTE ONLY): 50 min  Charges:  $Therapeutic Activity: 38-52 mins                    G Codes:       Derrick MoundKellyn R Larri [name] Derrick [name], PTA Pager: [phone]   02/21/2017, 4:48 PM

## 2017-02-21 NOTE — Progress Notes (Signed)
Patient wishes to have something to help him sleep. He would like to have something now if possible. MD paged

## 2017-02-22 DIAGNOSIS — A419 Sepsis, unspecified organism: Secondary | ICD-10-CM

## 2017-02-22 DIAGNOSIS — R5381 Other malaise: Secondary | ICD-10-CM

## 2017-02-22 DIAGNOSIS — Z89621 Acquired absence of right hip joint: Secondary | ICD-10-CM

## 2017-02-22 DIAGNOSIS — R6521 Severe sepsis with septic shock: Secondary | ICD-10-CM

## 2017-02-22 DIAGNOSIS — E118 Type 2 diabetes mellitus with unspecified complications: Secondary | ICD-10-CM

## 2017-02-22 DIAGNOSIS — Z794 Long term (current) use of insulin: Secondary | ICD-10-CM

## 2017-02-22 LAB — BASIC METABOLIC PANEL
ANION GAP: 7 (ref 5–15)
Anion gap: 7 (ref 5–15)
BUN: <5 mg/dL — ABNORMAL LOW (ref 6–20)
BUN: <5 mg/dL — ABNORMAL LOW (ref 6–20)
CALCIUM: 7.8 mg/dL — AB (ref 8.9–10.3)
CALCIUM: 7.9 mg/dL — AB (ref 8.9–10.3)
CO2: 29 mmol/L (ref 22–32)
CO2: 30 mmol/L (ref 22–32)
CREATININE: 0.78 mg/dL (ref 0.61–1.24)
CREATININE: 0.72 mg/dL (ref 0.61–1.24)
Chloride: 101 mmol/L (ref 101–111)
Chloride: 101 mmol/L (ref 101–111)
GLUCOSE: 133 mg/dL — AB (ref 65–99)
Glucose, Bld: 137 mg/dL — ABNORMAL HIGH (ref 65–99)
Potassium: 3.8 mmol/L (ref 3.5–5.1)
Potassium: 3.5 mmol/L (ref 3.5–5.1)
Sodium: 137 mmol/L (ref 135–145)
Sodium: 138 mmol/L (ref 135–145)

## 2017-02-22 LAB — GLUCOSE, CAPILLARY
GLUCOSE-CAPILLARY: 251 mg/dL — AB (ref 65–99)
GLUCOSE-CAPILLARY: 202 mg/dL — AB (ref 65–99)
GLUCOSE-CAPILLARY: 169 mg/dL — AB (ref 65–99)
GLUCOSE-CAPILLARY: 149 mg/dL — AB (ref 65–99)

## 2017-02-22 LAB — CBC
HCT: 22 % — ABNORMAL LOW (ref 39.0–52.0)
HCT: 21.6 % — ABNORMAL LOW (ref 39.0–52.0)
Hemoglobin: 7.1 g/dL — ABNORMAL LOW (ref 13.0–17.0)
Hemoglobin: 7 g/dL — ABNORMAL LOW (ref 13.0–17.0)
MCH: 27.2 pg (ref 26.0–34.0)
MCH: 26.9 pg (ref 26.0–34.0)
MCHC: 32.3 g/dL (ref 30.0–36.0)
MCHC: 32.4 g/dL (ref 30.0–36.0)
MCV: 84.3 fL (ref 78.0–100.0)
MCV: 83.1 fL (ref 78.0–100.0)
PLATELETS: 550 10*3/uL — AB (ref 150–400)
Platelets: 556 10*3/uL — ABNORMAL HIGH (ref 150–400)
RBC: 2.61 MIL/uL — AB (ref 4.22–5.81)
RBC: 2.6 MIL/uL — ABNORMAL LOW (ref 4.22–5.81)
RDW: 15.3 % (ref 11.5–15.5)
RDW: 14.9 % (ref 11.5–15.5)
WBC: 11.8 10*3/uL — ABNORMAL HIGH (ref 4.0–10.5)
WBC: 11.8 10*3/uL — ABNORMAL HIGH (ref 4.0–10.5)

## 2017-02-22 LAB — PREPARE RBC (CROSSMATCH)

## 2017-02-22 MED ORDER — SODIUM CHLORIDE 0.9 % IV SOLN: INTRAVENOUS | Status: DC

## 2017-02-22 MED ORDER — MAGNESIUM CITRATE PO SOLN: 1.0000 | Freq: Once | ORAL | Status: DC | PRN

## 2017-02-22 MED ORDER — POLYETHYLENE GLYCOL 3350 17 G PO PACK: 17.0000 g | PACK | Freq: Every day | ORAL | Status: DC | PRN

## 2017-02-22 MED ORDER — METOCLOPRAMIDE HCL 5 MG/ML IJ SOLN: 5.0000 mg | Freq: Three times a day (TID) | INTRAMUSCULAR | Status: DC | PRN

## 2017-02-22 MED ORDER — ONDANSETRON HCL 4 MG PO TABS: 4.0000 mg | ORAL_TABLET | Freq: Four times a day (QID) | ORAL | Status: DC | PRN

## 2017-02-22 MED ORDER — CEFAZOLIN SODIUM-DEXTROSE 2-4 GM/100ML-% IV SOLN
2.0000 g | INTRAVENOUS | Status: AC
  Administered 2017-02-23: 2 g via INTRAVENOUS

## 2017-02-22 MED ORDER — ACETAMINOPHEN 325 MG PO TABS: 650.0000 mg | ORAL_TABLET | Freq: Four times a day (QID) | ORAL | Status: DC | PRN

## 2017-02-22 MED ORDER — METOCLOPRAMIDE HCL 5 MG PO TABS: 5.0000 mg | ORAL_TABLET | Freq: Three times a day (TID) | ORAL | Status: DC | PRN

## 2017-02-22 MED ORDER — ONDANSETRON HCL 4 MG/2ML IJ SOLN: 4.0000 mg | Freq: Four times a day (QID) | INTRAMUSCULAR | Status: DC | PRN

## 2017-02-22 MED ORDER — CEFAZOLIN SODIUM-DEXTROSE 2-4 GM/100ML-% IV SOLN: 2.0000 g | INTRAVENOUS | Status: DC

## 2017-02-22 MED ORDER — BISACODYL 10 MG RE SUPP: 10.0000 mg | Freq: Every day | RECTAL | Status: DC | PRN

## 2017-02-22 MED ORDER — SODIUM CHLORIDE 0.9 % IV SOLN
Freq: Once | INTRAVENOUS | Status: AC
  Administered 2017-02-23: via INTRAVENOUS

## 2017-02-22 MED ORDER — INSULIN GLARGINE 100 UNIT/ML ~~LOC~~ SOLN
30.0000 [IU] | Freq: Every day | SUBCUTANEOUS | Status: DC
  Administered 2017-02-22 – 2017-02-23 (×2): 30 [IU] via SUBCUTANEOUS
  Filled 2017-02-22 (×2): qty 0.3

## 2017-02-22 MED ORDER — ACETAMINOPHEN 650 MG RE SUPP: 650.0000 mg | Freq: Four times a day (QID) | RECTAL | Status: DC | PRN

## 2017-02-22 MED ORDER — DOCUSATE SODIUM 100 MG PO CAPS: 100.0000 mg | ORAL_CAPSULE | Freq: Two times a day (BID) | ORAL | Status: DC

## 2017-02-22 NOTE — Consult Note (Signed)
Physical Medicine and Rehabilitation Consult  Reason for Consult: Debility due to right hip disarticulation with repeat infections.  Referring Physician: Dr. Lindell Noe.    HPI: Derrick [name] is a 46 y.o. 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 He developed purulent drainage due to recurrent infection, poorly controlled BS due to insulin non-compliance and was admitted on 02/16/17 for revision of wound with VAC placement and started on IV anbiotics.  ABLA treated with 3 units PRBC and wound culture positive for group A strep and Prevotella Bivia. He developed septic shock due with leucocytosis and treated with fluid bolus and antibiotics switched to Zosyn. He was taken back to OR for repeat I & D on 3/6 with plans for wound closure 02/23/17. Therapy ongoing and patient exhibiting anxiety with mobility as well as dizziness with sitting activity. CIR recommended for follow up therapy.   Received left below-knee prosthetic August 2017, states that he could use it, although his balance was off due to having had right toe amputations in March 2017   Has been non-ambulatory since Dec with family providing supervision at home. He sat up in a chair yesterday. Discussed rehab--he feels like he's in "jail" and would like to go outside. Discussed level of therapy, safety concerns,  compliance as well as avoidance of tobacco.   Patient states that if he came to rehabilitation, he would like to go outside when not in therapy.. We discussed smoking policy  Currently using morphine 4 mg IV every 4-6 hours on top of oxycodone 10 mg every 3-4 hours Review of Systems  HENT: Negative for hearing loss and tinnitus.   Eyes: Negative for blurred vision and double vision.  Respiratory: Negative for cough and shortness of breath.   Cardiovascular: Negative for  chest pain and palpitations.  Gastrointestinal: Negative for constipation, heartburn and nausea.  Genitourinary: Negative for dysuria and urgency.  Musculoskeletal: Negative for myalgias and neck pain.  Skin: Positive for itching (back). Negative for rash.  Neurological: Positive for dizziness.  Psychiatric/Behavioral: Negative for memory loss. The patient does not have insomnia.       Past Medical History:  Diagnosis Date  . Anemia   . Arthritis   . Diabetes mellitus    Type II  . GERD (gastroesophageal reflux disease)   . Neuropathy ([place])   . Peripheral neuropathy ([place])   . Pneumonia   . Prostatitis     Past Surgical History:  Procedure Laterality Date  . AMPUTATION  05/31/2012   Procedure: AMPUTATION DIGIT;  Surgeon: Newt Minion, MD;  Location: [place];  Service: Orthopedics;  Laterality: Right;  right fourth and fifth toe ray amputation   . AMPUTATION Right 12/14/2016   Procedure: AMPUTATION BELOW KNEE RIGHT;  Surgeon: Newt Minion, MD;  Location: [place];  Service: Orthopedics;  Laterality: Right;  . AMPUTATION Right 01/31/2017   Procedure: Right Above Knee Amputation;  Surgeon: Newt Minion, MD;  Location: [place];  Service: Orthopedics;  Laterality: Right;  . APPLICATION OF WOUND VAC Right 02/05/2017   Procedure: APPLICATION OF WOUND VAC;  Surgeon: Newt Minion, MD;  Location: [place];  Service: Orthopedics;  Laterality: Right;  . I&D EXTREMITY  08/02/2012   Procedure: IRRIGATION AND DEBRIDEMENT EXTREMITY;  Surgeon: Newt Minion, MD;  Location: [place];  Service: Orthopedics;  Laterality: Right;  Right  Foot Irrigation and Debridement, Place Antibiotic Beads  . I&D EXTREMITY Right 02/16/2017   Procedure: Debridement Right Hip Disarticulation, Placement of Infusional Wound VAC;  Surgeon: Newt Minion, MD;  Location: [place];  Service: Orthopedics;  Laterality: Right;  . INCISION AND DRAINAGE HIP Right 02/20/2017   Procedure: IRRIGATION AND DEBRIDEMENT RIGHT HIP;  Surgeon: Newt Minion,  MD;  Location: [place];  Service: Orthopedics;  Laterality: Right;  . SHOULDER SURGERY Bilateral    rotarcuff  . STUMP REVISION Left 07/12/2016   Procedure: Revision Left Below Knee Amputation;  Surgeon: Newt Minion, MD;  Location: [place];  Service: Orthopedics;  Laterality: Left;  . STUMP REVISION Right 01/24/2017   Procedure: Revision Right Below Knee Amputation;  Surgeon: Newt Minion, MD;  Location: [place];  Service: Orthopedics;  Laterality: Right;  . STUMP REVISION Right 02/05/2017   Procedure: REVISION RIGHT AKA;  Surgeon: Newt Minion, MD;  Location: [place];  Service: Orthopedics;  Laterality: Right;  . STUMP REVISION Right 02/07/2017   Procedure: RIGHT HIP DISARTICULATION;  Surgeon: Newt Minion, MD;  Location: [place];  Service: Orthopedics;  Laterality: Right;  . TOE AMPUTATION     multiple toes amputated.    Family History  Problem Relation Age of Onset  . Cancer Mother   . Congestive Heart Failure Father     Social History:  Lives with girlfriend. Uses wheelchair/electric scooter and was able to transfer independently PTA. He  reports that he has been smoking Cigarettes--2 PPD.  He has a 48.00 pack-year smoking history. He has never used smokeless tobacco. He reports that he does not drink alcohol or use drugs.    Allergies  Allergen Reactions  . Vancomycin Other (See Comments)    Acute kidney injury > Dose Related     Medications Prior to Admission  Medication Sig Dispense Refill  . amoxicillin-clavulanate (AUGMENTIN) 875-125 MG tablet Take 1 tablet by mouth 2 (two) times daily. (Patient taking differently: Take 1 tablet by mouth 2 (two) times daily. 28 day course filled 02/11/17) 56 tablet 0  . gabapentin (NEURONTIN) 100 MG capsule Take 1 capsule (100 mg total) by mouth 3 (three) times daily. 90 capsule 0  . glipiZIDE (GLUCOTROL) 5 MG tablet Take 0.5 tablets (2.5 mg total) by mouth daily before breakfast. 15 tablet 0  . insulin NPH Human (NOVOLIN N) 100 UNIT/ML injection  Please inject 7 units subcutaneously the morning and 7 units in the evening. (Patient taking differently: Inject 7 Units into the skin 2 (two) times daily before a meal. ) 3 mL 3  . methocarbamol (ROBAXIN) 500 MG tablet Take 1 tablet (500 mg total) by mouth every 6 (six) hours as needed for muscle spasms. 30 tablet 0  . oxyCODONE-acetaminophen (PERCOCET) 7.5-325 MG tablet Take 1 tablet by mouth every 6 (six) hours as needed for severe pain. 29 tablet 0  . promethazine (PHENERGAN) 25 MG tablet Take 25 mg by mouth every 6 (six) hours as needed for nausea or vomiting.    Marland Kitchen acetaminophen (TYLENOL) 325 MG tablet Take 2 tablets (650 mg total) by mouth every 6 (six) hours as needed for mild pain or fever (or Fever >/= 101). Acetaminophen dose from all sources not to exceed 4 g per day. (Patient not taking: Reported on 02/17/2017)    . blood glucose meter kit and supplies KIT Dispense based on patient and insurance preference. Use up to four times daily as directed. (FOR ICD-9 250.00, 250.01). 1 each  0  . glucose blood (RELION GLUCOSE TEST STRIPS) test strip Use as instructed 100 each 12  . Insulin Pen Needle (PEN NEEDLES 3/16") 31G X 5 MM MISC Use as directed 3 times daily with meals and at bedtime. 120 each 0  . lisinopril (PRINIVIL,ZESTRIL) 10 MG tablet Take 1 tablet (10 mg total) by mouth daily. (Patient not taking: Reported on 02/17/2017) 30 tablet 0  . nicotine (NICODERM CQ - DOSED IN MG/24 HOURS) 21 mg/24hr patch Place 1 patch (21 mg total) onto the skin daily. (Patient not taking: Reported on 02/17/2017) 28 patch 0  . Saccharomyces boulardii (PROBIOTIC) 250 MG CAPS Take 1 capsule by mouth daily. (Patient not taking: Reported on 02/17/2017) 42 capsule 0    Home: Home Living Family/patient expects to be discharged to:: Private residence Living Arrangements: Other (Comment), Spouse/significant other (girlfriend) Available Help at Discharge: Friend(s), Family, Available PRN/intermittently (girlfriend,  sister) Type of Home: House Home Access: Ramped entrance Home Layout: One level Bathroom Shower/Tub: Chiropodist: Programmer, systems: Yes Home Equipment: Transport planner, Wheelchair - manual, Environmental consultant - 2 wheels Additional Comments: Marine scientist  Functional History: Prior Function Level of Independence: Independent with assistive device(s) Comments: Reports using scooter for mobility. Was doing scooter<>bed transfers independently. For the past month pt states he spent alot of time in bed due to R leg pain and mobility issues. Prior to this, pt active in community, [place] ad[place] enjoyed watching his son play baseball.  Functional Status:  Mobility: Bed Mobility Overal bed mobility: Needs Assistance Bed Mobility: Supine to Sit Supine to sit: Mod assist Sit to supine: Min assist General bed mobility comments: assist to elevate trunk into sitting; cues for technique Transfers Overall transfer level: Needs assistance Equipment used: None Transfers: Squat Pivot Transfers Squat pivot transfers: From elevated surface, Mod assist General transfer comment: assist to power up into standing and maintain balance for transfer; cues for sequnecing, technique, and hand placement; pt refused to use RW due to previous fall with RW at home      ADL:    Cognition: Cognition Overall Cognitive Status: Within Functional Limits for tasks assessed Orientation Level: Oriented X4 Cognition Arousal/Alertness: Awake/alert Behavior During Therapy: WFL for tasks assessed/performed, Agitated Overall Cognitive Status: Within Functional Limits for tasks assessed General Comments: pt is easily agitated and very anxious about mobility   Blood pressure 107/66, pulse (!) 109, temperature 98.8 F (37.1 C), temperature source Oral, resp. rate 18, height _0  (1.854 m), weight 200 lb (90.7 kg), SpO2 98 %. Physical Exam  Nursing note and  vitals reviewed. Constitutional: He is oriented to person, place, and time. He appears well-developed and well-nourished.  Thin male lying in bed.   HENT:  Head: Normocephalic and atraumatic.  Eyes: Conjunctivae are normal. Pupils are equal, round, and reactive to light.  Neck: Normal range of motion. Neck supple.  Cardiovascular: Normal rate and regular rhythm.   Respiratory: Effort normal and breath sounds normal. No stridor. No respiratory distress. He has no wheezes.  GI: Soft. Bowel sounds are normal. He exhibits no distension. There is no tenderness.  Musculoskeletal: He exhibits no edema or tenderness.  VAC pm right hip.  Keeps left lower residual limb elevated. Able to move BUE and LLE without difficulty.   Neurological: He is alert and oriented to person, place, and time.  Skin: Skin is warm and dry.  Psychiatric: He has a normal mood and affect. His behavior is normal. Thought content  normal.    Results for orders placed or performed during the hospital encounter of 02/16/17 (from the past 24 hour(s))  Glucose, capillary     Status: Abnormal   Collection Time: 02/21/17  4:32 PM  Result Value Ref Range   Glucose-Capillary 187 (H) 65 - 99 mg/dL  Glucose, capillary     Status: Abnormal   Collection Time: 02/21/17  9:02 PM  Result Value Ref Range   Glucose-Capillary 233 (H) 65 - 99 mg/dL  Glucose, capillary     Status: Abnormal   Collection Time: 02/22/17  6:14 AM  Result Value Ref Range   Glucose-Capillary 251 (H) 65 - 99 mg/dL  CBC     Status: Abnormal   Collection Time: 02/22/17  8:39 AM  Result Value Ref Range   WBC 11.8 (H) 4.0 - 10.5 K/uL   RBC 2.61 (L) 4.22 - 5.81 MIL/uL   Hemoglobin 7.1 (L) 13.0 - 17.0 g/dL   HCT 22.0 (L) 39.0 - 52.0 %   MCV 84.3 78.0 - 100.0 fL   MCH 27.2 26.0 - 34.0 pg   MCHC 32.3 30.0 - 36.0 g/dL   RDW 15.3 11.5 - 15.5 %   Platelets 556 (H) 150 - 400 K/uL  Glucose, capillary     Status: Abnormal   Collection Time: 02/22/17 12:15 PM  Result  Value Ref Range   Glucose-Capillary 202 (H) 65 - 99 mg/dL   No results found.  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 Does the need for close, 24 hr/day medical supervision in concert with the patient's rehab needs make it unreasonable for this patient to be served in a less intensive setting? Yes 2. Co-Morbidities requiring supervision/potential complications: Type 2 diabetes, history of left below-knee amputation, narcotic dependence, anemia 3. Due to bladder management, bowel management, safety, skin/wound care, disease management, medication administration, pain management and patient education, does the patient require 24 hr/day rehab nursing? Yes 4. Does the patient require coordinated care of a physician, rehab nurse, PT (1-2 hrs/day, 5 days/week) and OT (1-2 hrs/day, 5 days/week) to address physical and functional deficits in the context of the above medical diagnosis(es)? Yes Addressing deficits in the following areas: balance, endurance, locomotion, strength, transferring, bowel/bladder control, bathing, dressing, feeding, grooming, toileting and psychosocial support 5. Can the patient actively participate in an intensive therapy program of at least 3 hrs of therapy per day at least 5 days per week? Potentially 6. The potential for patient to make measurable gains while on inpatient rehab is good 7. Anticipated functional outcomes upon discharge from inpatient rehab are modified independent and supervision  with PT, modified independent and supervision with OT, n/a with SLP. 8. Estimated rehab length of stay to reach the above functional goals is: 10-14 days 9. Does the patient have adequate social supports and living environment to accommodate these discharge functional goals? Yes 10. Anticipated D/C setting: Home 11. Anticipated post D/C treatments: [place] therapy 12. Overall Rehab/Functional Prognosis:  good  RECOMMENDATIONS: This patient's condition is appropriate for continued rehabilitative care in the following setting: CIR Patient has agreed to participate in recommended program. Yes Note that insurance prior authorization may be required for reimbursement for recommended care.  Comment: Needs to be cleared surgically after wound closure on 02/23/2017   Bary Leriche, Hershal Coria 02/22/2017

## 2017-02-22 NOTE — Progress Notes (Signed)
I met with pt an his daughter, Lonn Georgia, at [facility] side to discuss a possible inpt rehab admission. Pt very motivated to participate with therapy to learn safe transfers and adls, BUT, very focused on his need to smoke. He has not smoked since admit this time, but would want grounds pass off floor for rehab. I discussed that passes are given to cafeteria,etc. But not for the purpose of smoking. They will discuss tonight, and I will follow up tomorrow for overall plan. Admission would depend also on bed availability when pt medically ready for d/c. [phone]

## 2017-02-22 NOTE — Progress Notes (Signed)
Patient reminded that pain medication will not be automatically brought to him; he must request it.

## 2017-02-22 NOTE — Progress Notes (Signed)
Family Medicine Teaching Service CONSULT NOTE Intern Pager: [phone]  Patient name: Derrick [name] Medical record number: [id number] Date of birth: 10/04/1971 Age: 46 y.o. Gender: male  Primary Care Provider: Mickie HillierIan McKeag, MD  Pt Overview and Major Events to Date:  3/2: Revision of R hip disarticulation with placement of wound vac w/ Dr. Lajoyce Cornersuda  Assessment and Plan: Derrick ClicheBobby is a 46 year old male with a PMH of T2DM, HTN, tobacco use, L BKA, R AKA.  Recurrent Joint Infection of R Hip: Pain well controlled.  - On Ancef 3/2-3/5, then switched to Cefepime 3/5. Switch to Zosyn, as wound cultures growing Prevotella and GBS. - Plan for for wound irrigation on 3/9 - Blood cultures- NGTD  Severe sepsis with septic shock, resolved: Euvolemic volume status.  - SLIV and continue to encourage PO intake - Holding home Lisinopril.  Normocytic Anemia, stable: CBC pending - Daily CBCs   Hypokalemia, resolved: BMP pending - Check BMET in the AM   Hyponatremia: BMP pending - encouraging PO intake - Recheck Na in the AM  Diabetes mellitus,uncontrolled. CBG this AM 233 - increase lantus to 30U tonight - continue mSSI - Stop glipizide while hospitalized  Tobacco use - Nicotine patch  FEN/GI: heart healthy diet PPx: subQ heparain today, will hold tomorrow  Disposition: CIR consult placed. Patient MIGHT want to go. Will get debridement on 3/9. Could probably go this weekend no fevers.   Subjective:  Slept well last night. Denies CP, SOB, NV or diarrhea.  Objective: Temp:  [98.5 F (36.9 C)-98.8 F (37.1 C)] 98.8 F (37.1 C) (03/08 0600) Pulse Rate:  [95-109] 109 (03/08 0600) Resp:  [17-18] 18 (03/08 0600) BP: (94-110)/(66-74) 107/66 (03/08 0600) SpO2:  [98 %-100 %] 98 % (03/08 0600) Physical Exam: General: Awake, alert, NAD Cardiovascular: RRR, no murmurs Respiratory: Normal work of breathing Abdomen: +BS, soft, non-tender, non-distended Extremities: s/p R hip disarticulation with  wound vac in place, well-healed L BKA  Laboratory:  Recent Labs Lab 02/19/17 0816 02/19/17 1537 02/21/17 0653  WBC 18.0* 15.2* 12.2*  HGB 8.3* 7.9* 7.9*  HCT 25.6* 23.8* 24.9*  PLT 485* 471* 646*    Recent Labs Lab 02/19/17 0816 02/19/17 1537 02/21/17 0653  NA 131* 132* 133*  K 3.7 3.3* 4.4  CL 97* 95* 97*  CO2 28 28 26   BUN 8 7 <5*  CREATININE 0.74 0.68 0.68  CALCIUM 7.8* 7.6* 8.4*  PROT 6.5 6.0*  --   BILITOT 0.3 0.3  --   ALKPHOS 323* 284*  --   ALT 20 22  --   AST 32 39  --   GLUCOSE 182* 102* 293*    Derrick Muscaaniel L Warden, MD 02/22/2017, 9:46 AM PGY-1, [facility] FPTS Intern pager: [phone], text pages welcome

## 2017-02-22 NOTE — Progress Notes (Signed)
Patient ID: Derrick [name], male   DOB: 11/30/1971, 45 y.o.   MRN: [id number] The infusion wound VAC is working well patient has no complaints he is pleasant this morning. Plan for repeat irrigation debridement right hip tomorrow. If there is healthy viable granulation tissue we will plan for wound closure. 

## 2017-02-22 NOTE — Progress Notes (Signed)
Inpatient Diabetes Program Recommendations  AACE/ADA: New Consensus Statement on Inpatient Glycemic Control (2015)  Target Ranges:  Prepandial:   less than 140 mg/dL      Peak postprandial:   less than 180 mg/dL (1-2 hours)      Critically ill patients:  140 - 180 mg/dL   Lab Results  Component Value Date   GLUCAP 251 (H) 02/22/2017   HGBA1C 14.5 (H) 12/13/2016    Review of Glycemic Control  Inpatient Diabetes Program Recommendations:   Insulin - Basal: Noted patient got a total of Lantus 30 units yesterday and his FBG = 251 this am.  Please consider ordering Lantus 30 units QHS.  Thank you,  Kristine LineaKaren Elice Crigger, RN, MSN Diabetes Coordinator Inpatient Diabetes Program [phone] (Team Pager)

## 2017-02-23 ENCOUNTER — Encounter (HOSPITAL_COMMUNITY): Payer: Self-pay | Admitting: Surgery

## 2017-02-23 ENCOUNTER — Inpatient Hospital Stay (HOSPITAL_COMMUNITY): Payer: Medicare Other | Admitting: Certified Registered"

## 2017-02-23 ENCOUNTER — Encounter (HOSPITAL_COMMUNITY)
Admission: RE | Disposition: A | Discharge status: Home Health | Payer: Self-pay | Source: Ambulatory Visit | Attending: Orthopedic Surgery

## 2017-02-23 HISTORY — PX: INCISION AND DRAINAGE HIP: SHX1801

## 2017-02-23 LAB — GLUCOSE, CAPILLARY
GLUCOSE-CAPILLARY: 73 mg/dL (ref 65–99)
GLUCOSE-CAPILLARY: 77 mg/dL (ref 65–99)
Glucose-Capillary: 95 mg/dL (ref 65–99)
Glucose-Capillary: 136 mg/dL — ABNORMAL HIGH (ref 65–99)

## 2017-02-23 LAB — BASIC METABOLIC PANEL
ANION GAP: 8 (ref 5–15)
BUN: <5 mg/dL — ABNORMAL LOW (ref 6–20)
CALCIUM: 7.8 mg/dL — AB (ref 8.9–10.3)
CHLORIDE: 102 mmol/L (ref 101–111)
CO2: 30 mmol/L (ref 22–32)
CREATININE: 0.73 mg/dL (ref 0.61–1.24)
GFR calc non Af Amer: >60 mL/min (ref >60)
GLUCOSE: 89 mg/dL (ref 65–99)
Potassium: 3.4 mmol/L — ABNORMAL LOW (ref 3.5–5.1)
Sodium: 140 mmol/L (ref 135–145)

## 2017-02-23 LAB — CBC
HEMATOCRIT: 24.9 % — AB (ref 39.0–52.0)
Hemoglobin: 8.1 g/dL — ABNORMAL LOW (ref 13.0–17.0)
MCH: 27.2 pg (ref 26.0–34.0)
MCHC: 32.5 g/dL (ref 30.0–36.0)
MCV: 83.6 fL (ref 78.0–100.0)
Platelets: 576 10*3/uL — ABNORMAL HIGH (ref 150–400)
RBC: 2.98 MIL/uL — ABNORMAL LOW (ref 4.22–5.81)
RDW: 14.6 % (ref 11.5–15.5)
WBC: 11.2 10*3/uL — ABNORMAL HIGH (ref 4.0–10.5)

## 2017-02-23 SURGERY — IRRIGATION AND DEBRIDEMENT HIP
Anesthesia: General | Site: Hip | Laterality: Right

## 2017-02-23 MED ORDER — PHENYLEPHRINE 40 MCG/ML (10ML) SYRINGE FOR IV PUSH (FOR BLOOD PRESSURE SUPPORT)
PREFILLED_SYRINGE | INTRAVENOUS | Status: AC
  Filled 2017-02-23: qty 10

## 2017-02-23 MED ORDER — BUSPIRONE HCL 5 MG PO TABS
7.5000 mg | ORAL_TABLET | Freq: Two times a day (BID) | ORAL | Status: DC
  Administered 2017-02-23 – 2017-02-26 (×6): 7.5 mg via ORAL
  Filled 2017-02-23 (×6): qty 2

## 2017-02-23 MED ORDER — SODIUM CHLORIDE 0.9 % IV SOLN
INTRAVENOUS | Status: DC
  Administered 2017-02-23: 18:00:00 via INTRAVENOUS

## 2017-02-23 MED ORDER — HYDROMORPHONE HCL 1 MG/ML IJ SOLN: 0.2500 mg | INTRAMUSCULAR | Status: DC | PRN

## 2017-02-23 MED ORDER — ACETAMINOPHEN 325 MG PO TABS: 650.0000 mg | ORAL_TABLET | Freq: Four times a day (QID) | ORAL | Status: DC | PRN

## 2017-02-23 MED ORDER — TRANEXAMIC ACID 1000 MG/10ML IV SOLN
2000.0000 mg | INTRAVENOUS | Status: AC
  Administered 2017-02-23: 2000 mg via TOPICAL
  Filled 2017-02-23: qty 20

## 2017-02-23 MED ORDER — ONDANSETRON HCL 4 MG/2ML IJ SOLN
INTRAMUSCULAR | Status: DC | PRN
  Administered 2017-02-23: 4 mg via INTRAVENOUS

## 2017-02-23 MED ORDER — PROPOFOL 10 MG/ML IV BOLUS
INTRAVENOUS | Status: DC | PRN
  Administered 2017-02-23: 130 mg via INTRAVENOUS

## 2017-02-23 MED ORDER — LACTATED RINGERS IV SOLN
INTRAVENOUS | Status: DC | PRN
  Administered 2017-02-23: 12:00:00 via INTRAVENOUS

## 2017-02-23 MED ORDER — ONDANSETRON HCL 4 MG/2ML IJ SOLN
INTRAMUSCULAR | Status: AC
  Filled 2017-02-23: qty 2

## 2017-02-23 MED ORDER — PHENYLEPHRINE HCL 10 MG/ML IJ SOLN
INTRAMUSCULAR | Status: DC | PRN
  Administered 2017-02-23: 25 ug/min via INTRAVENOUS

## 2017-02-23 MED ORDER — ONDANSETRON HCL 4 MG PO TABS: 4.0000 mg | ORAL_TABLET | Freq: Four times a day (QID) | ORAL | Status: DC | PRN

## 2017-02-23 MED ORDER — PHENYLEPHRINE HCL 10 MG/ML IJ SOLN
INTRAMUSCULAR | Status: DC | PRN
  Administered 2017-02-23 (×2): 120 ug via INTRAVENOUS
  Administered 2017-02-23: 80 ug via INTRAVENOUS
  Administered 2017-02-23: 40 ug via INTRAVENOUS

## 2017-02-23 MED ORDER — FENTANYL CITRATE (PF) 100 MCG/2ML IJ SOLN
INTRAMUSCULAR | Status: AC
  Filled 2017-02-23: qty 4

## 2017-02-23 MED ORDER — METOCLOPRAMIDE HCL 5 MG/ML IJ SOLN: 5.0000 mg | Freq: Three times a day (TID) | INTRAMUSCULAR | Status: DC | PRN

## 2017-02-23 MED ORDER — METOCLOPRAMIDE HCL 5 MG PO TABS: 5.0000 mg | ORAL_TABLET | Freq: Three times a day (TID) | ORAL | Status: DC | PRN

## 2017-02-23 MED ORDER — PROMETHAZINE HCL 25 MG/ML IJ SOLN: 6.2500 mg | INTRAMUSCULAR | Status: DC | PRN

## 2017-02-23 MED ORDER — MAGNESIUM CITRATE PO SOLN: 1.0000 | Freq: Once | ORAL | Status: DC | PRN

## 2017-02-23 MED ORDER — LIDOCAINE 2% (20 MG/ML) 5 ML SYRINGE
INTRAMUSCULAR | Status: DC | PRN
  Administered 2017-02-23: 60 mg via INTRAVENOUS

## 2017-02-23 MED ORDER — DOCUSATE SODIUM 100 MG PO CAPS
100.0000 mg | ORAL_CAPSULE | Freq: Two times a day (BID) | ORAL | Status: DC
  Administered 2017-02-23 – 2017-02-26 (×6): 100 mg via ORAL
  Filled 2017-02-23 (×6): qty 1

## 2017-02-23 MED ORDER — SODIUM CHLORIDE 0.9 % IR SOLN
Status: DC | PRN
  Administered 2017-02-23: 4000 mL

## 2017-02-23 MED ORDER — POLYETHYLENE GLYCOL 3350 17 G PO PACK: 17.0000 g | PACK | Freq: Every day | ORAL | Status: DC | PRN

## 2017-02-23 MED ORDER — ONDANSETRON HCL 4 MG/2ML IJ SOLN: 4.0000 mg | Freq: Four times a day (QID) | INTRAMUSCULAR | Status: DC | PRN

## 2017-02-23 MED ORDER — METHOCARBAMOL 500 MG PO TABS
500.0000 mg | ORAL_TABLET | Freq: Four times a day (QID) | ORAL | Status: DC | PRN
  Administered 2017-02-23 – 2017-02-25 (×5): 500 mg via ORAL
  Filled 2017-02-23: qty 1

## 2017-02-23 MED ORDER — MIDAZOLAM HCL 2 MG/2ML IJ SOLN
INTRAMUSCULAR | Status: AC
  Filled 2017-02-23: qty 2

## 2017-02-23 MED ORDER — BISACODYL 10 MG RE SUPP: 10.0000 mg | Freq: Every day | RECTAL | Status: DC | PRN

## 2017-02-23 MED ORDER — METHOCARBAMOL 1000 MG/10ML IJ SOLN
500.0000 mg | Freq: Four times a day (QID) | INTRAVENOUS | Status: DC | PRN
  Filled 2017-02-23: qty 5

## 2017-02-23 MED ORDER — PROPOFOL 10 MG/ML IV BOLUS
INTRAVENOUS | Status: AC
  Filled 2017-02-23: qty 20

## 2017-02-23 MED ORDER — ACETAMINOPHEN 650 MG RE SUPP: 650.0000 mg | Freq: Four times a day (QID) | RECTAL | Status: DC | PRN

## 2017-02-23 SURGICAL SUPPLY — 42 items
CANISTER WOUND CARE 500ML ATS (WOUND CARE) ×2 IMPLANT
COLLECTOR WOUND DRAINAGE MED (WOUND CARE) ×2 IMPLANT
COVER SURGICAL LIGHT HANDLE (MISCELLANEOUS) ×6 IMPLANT
DRAPE IMP U-DRAPE 54X76 (DRAPES) ×3 IMPLANT
DRAPE ORTHO SPLIT 77X108 STRL (DRAPES) ×6
DRAPE SURG ORHT 6 SPLT 77X108 (DRAPES) ×2 IMPLANT
DRAPE U-SHAPE 47X51 STRL (DRAPES) ×5 IMPLANT
DRSG ADAPTIC 3X8 NADH LF (GAUZE/BANDAGES/DRESSINGS) ×1 IMPLANT
DRSG MEPILEX BORDER 4X8 (GAUZE/BANDAGES/DRESSINGS) ×1 IMPLANT
DRSG PAD ABDOMINAL 8X10 ST (GAUZE/BANDAGES/DRESSINGS) ×1 IMPLANT
DURAPREP 26ML APPLICATOR (WOUND CARE) ×3 IMPLANT
ELECT CAUTERY BLADE 6.4 (BLADE) IMPLANT
ELECT REM PT RETURN 9FT ADLT (ELECTROSURGICAL)
ELECTRODE REM PT RTRN 9FT ADLT (ELECTROSURGICAL) IMPLANT
GAUZE SPONGE 4X4 12PLY STRL (GAUZE/BANDAGES/DRESSINGS) ×1 IMPLANT
GLOVE BIOGEL PI IND STRL 9 (GLOVE) ×1 IMPLANT
GLOVE BIOGEL PI INDICATOR 9 (GLOVE) ×2
GLOVE SURG ORTHO 9.0 STRL STRW (GLOVE) ×3 IMPLANT
GOWN STRL REUS W/ TWL XL LVL3 (GOWN DISPOSABLE) ×2 IMPLANT
GOWN STRL REUS W/TWL XL LVL3 (GOWN DISPOSABLE) ×6
HANDPIECE INTERPULSE COAX TIP (DISPOSABLE) ×3
KIT BASIN OR (CUSTOM PROCEDURE TRAY) ×3 IMPLANT
KIT ROOM TURNOVER OR (KITS) ×3 IMPLANT
MANIFOLD NEPTUNE II (INSTRUMENTS) ×3 IMPLANT
NS IRRIG 1000ML POUR BTL (IV SOLUTION) ×3 IMPLANT
PACK TOTAL JOINT (CUSTOM PROCEDURE TRAY) ×3 IMPLANT
PACK UNIVERSAL I (CUSTOM PROCEDURE TRAY) ×3 IMPLANT
PAD ARMBOARD 7.5X6 YLW CONV (MISCELLANEOUS) ×2 IMPLANT
SET HNDPC FAN SPRY TIP SCT (DISPOSABLE) IMPLANT
SPONGE LAP 18X18 X RAY DECT (DISPOSABLE) ×3 IMPLANT
STAPLER VISISTAT 35W (STAPLE) ×3 IMPLANT
SUT ETHIBOND NAB CT1 #1 30IN (SUTURE) ×1 IMPLANT
SUT ETHILON 2 0 PSLX (SUTURE) ×6 IMPLANT
SUT VIC AB 1 CTX 36 (SUTURE)
SUT VIC AB 1 CTX36XBRD ANBCTR (SUTURE) ×1 IMPLANT
SUT VIC AB 2-0 CT1 27 (SUTURE)
SUT VIC AB 2-0 CT1 TAPERPNT 27 (SUTURE) ×1 IMPLANT
TOWEL OR 17X24 6PK STRL BLUE (TOWEL DISPOSABLE) ×3 IMPLANT
TOWEL OR 17X26 10 PK STRL BLUE (TOWEL DISPOSABLE) ×3 IMPLANT
TUBE ANAEROBIC SPECIMEN COL (MISCELLANEOUS) IMPLANT
UNDERPAD 30X30 (UNDERPADS AND DIAPERS) ×3 IMPLANT
WATER STERILE IRR 1000ML POUR (IV SOLUTION) ×3 IMPLANT

## 2017-02-23 NOTE — Progress Notes (Signed)
Family Medicine Teaching Service CONSULT NOTE Intern Pager: [phone]  Patient name: Derrick [name] Medical record number: [id number] Date of birth: 11/27/1971 Age: 46 y.o. Gender: male  Primary Care Provider: Mickie HillierIan McKeag, MD  Pt Overview and Major Events to Date:  3/2: Revision of R hip disarticulation with placement of wound vac w/ Dr. Lajoyce Cornersuda  Assessment and Plan: Derrick [name] is a 46 year old male with a PMH of T2DM, HTN, tobacco use, L BKA, R AKA.  Recurrent Joint Infection of R Hip: Pain well controlled. S/p irrigation and debridement this AM.   - On Ancef 3/2-3/5, then switched to Cefepime 3/5. Switch to Zosyn, as wound cultures growing Prevotella and GBS. - Blood cultures- NGTD - pt is thinking about CIR, has been recommended by PM&R  Severe sepsis with septic shock, resolved: Euvolemic volume status.  - SLIV and continue to encourage PO intake - Holding home Lisinopril.  Normocytic Anemia, stable: Hgb to 7.0 yesterday and is now s/p 1U PRBC with improvement to 8.1 this AM.  - Daily CBCs   Hypokalemia: BMP to 3.4  - Check BMET in the AM   Hyponatremia, resolved:  - encouraging PO intake - Recheck Na in the AM  Diabetes mellitus,uncontrolled. Preprandial this AM to 95.  - lantus to 30U tonight - continue mSSI - Stop glipizide while hospitalized  Tobacco use - Nicotine patch  FEN/GI: NPO for procedure PPx: holding heparin  Disposition: PM&R recommended CIR. Patient MIGHT want to go.   Subjective:  Worried about pain control upon discharge and anxiety management.   Objective: Temp:  [98.1 F (36.7 C)-99 F (37.2 C)] 98.1 F (36.7 C) (03/09 0257) Pulse Rate:  [99-108] 99 (03/09 0257) Resp:  [16-18] 16 (03/09 0257) BP: (93-109)/(61-78) 93/61 (03/09 0257) SpO2:  [97 %-100 %] 98 % (03/09 0257) Physical Exam: General: Awake, alert, NAD Cardiovascular: RRR, no murmurs Respiratory: Normal work of breathing Abdomen: +BS, soft, non-tender, non-distended Extremities:  s/p R hip disarticulation with wound vac in place, well-healed L BKA  Laboratory:  Recent Labs Lab 02/22/17 0839 02/22/17 1427 02/23/17 0613  WBC 11.8* 11.8* 11.2*  HGB 7.1* 7.0* 8.1*  HCT 22.0* 21.6* 24.9*  PLT 556* 550* 576*    Recent Labs Lab 02/19/17 0816 02/19/17 1537  02/22/17 0839 02/22/17 1427 02/23/17 0613  NA 131* 132*  < > 137 138 140  K 3.7 3.3*  < > 3.8 3.5 3.4*  CL 97* 95*  < > 101 101 102  CO2 28 28  < > 29 30 30   BUN 8 7  < > <5* <5* <5*  CREATININE 0.74 0.68  < > 0.78 0.72 0.73  CALCIUM 7.8* 7.6*  < > 7.8* 7.9* 7.8*  PROT 6.5 6.0*  --   --   --   --   BILITOT 0.3 0.3  --   --   --   --   ALKPHOS 323* 284*  --   --   --   --   ALT 20 22  --   --   --   --   AST 32 39  --   --   --   --   GLUCOSE 182* 102*  < > 137* 133* 89  < > = values in this interval not displayed.  Derrick Muscaaniel L Perris Conwell, MD 02/23/2017, 9:02 AM PGY-1, [facility] FPTS Intern pager: [phone], text pages welcome

## 2017-02-23 NOTE — Progress Notes (Signed)
I met with pt and his fiance at bedside. Pt wants to go home with [facility] with the assistance of his family at d/c. Does not want to go to inpt rehab. We will sign off at this time. RN Cm is aware. [phone]

## 2017-02-23 NOTE — Progress Notes (Signed)
Tech offered Pt a bath. Pt refused.

## 2017-02-23 NOTE — Anesthesia Preprocedure Evaluation (Signed)
Anesthesia Evaluation  Patient identified by MRN, date of birth, ID band Patient awake    Reviewed: Allergy & Precautions, H&P , NPO status , Patient's Chart, lab work & pertinent test results  Airway Mallampati: II  TM Distance: >3 FB Neck ROM: Full    Dental no notable dental hx. (+) Poor Dentition, Dental Advisory Given   Pulmonary Current Smoker,    Pulmonary exam normal breath sounds clear to auscultation       Cardiovascular hypertension, Pt. on medications  Rhythm:Regular Rate:Normal     Neuro/Psych negative neurological ROS  negative psych ROS   GI/Hepatic Neg liver ROS, GERD  ,  Endo/Other  diabetes, Insulin Dependent  Renal/GU negative Renal ROS  negative genitourinary   Musculoskeletal  (+) Arthritis , Osteoarthritis,    Abdominal   Peds  Hematology negative hematology ROS (+) anemia ,   Anesthesia Other Findings   Reproductive/Obstetrics negative OB ROS                             Lab Results  Component Value Date   WBC 11.2 (H) 02/23/2017   HGB 8.1 (L) 02/23/2017   HCT 24.9 (L) 02/23/2017   MCV 83.6 02/23/2017   PLT 576 (H) 02/23/2017   Lab Results  Component Value Date   CREATININE 0.73 02/23/2017   BUN <5 (L) 02/23/2017   NA 140 02/23/2017   K 3.4 (L) 02/23/2017   CL 102 02/23/2017   CO2 30 02/23/2017    Anesthesia Physical  Anesthesia Plan  ASA: III  Anesthesia Plan: General   Post-op Pain Management:    Induction: Intravenous  Airway Management Planned: LMA  Additional Equipment:   Intra-op Plan:   Post-operative Plan: Extubation in OR  Informed Consent: I have reviewed the patients History and Physical, chart, labs and discussed the procedure including the risks, benefits and alternatives for the proposed anesthesia with the patient or authorized representative who has indicated his/her understanding and acceptance.   Dental advisory  given  Plan Discussed with: CRNA  Anesthesia Plan Comments:         Anesthesia Quick Evaluation

## 2017-02-23 NOTE — Op Note (Signed)
02/16/2017 - 02/23/2017  1:20 PM  PATIENT:  Derrick [name]    PRE-OPERATIVE DIAGNOSIS:  Abscess Right Hip  POST-OPERATIVE DIAGNOSIS:  Same  PROCEDURE:  REPEAT IRRIGATION AND DEBRIDEMENT RIGHT HIP WITH WOUND CLOSURE AND INCISIONAL VAC PLACEMENT  SURGEON:  Nadara MustardMarcus V Breydon Senters, MD  PHYSICIAN ASSISTANT:None ANESTHESIA:   General  PREOPERATIVE INDICATIONS:  Derrick [name] is a  46 y.o. male with a diagnosis of Abscess Right Hip who failed conservative measures and elected for surgical management.    The risks benefits and alternatives were discussed with the patient preoperatively including but not limited to the risks of infection, bleeding, nerve injury, cardiopulmonary complications, the need for revision surgery, among others, and the patient was willing to proceed.  OPERATIVE IMPLANTS: TXA 2 g topically  OPERATIVE FINDINGS: Good bleeding on the muscle surface no abscess  OPERATIVE PROCEDURE: Patient brought the operating room and underwent a general anesthetic. After adequate levels anesthesia obtained patient's right lower extremity was prepped using DuraPrep draped into a sterile field a timeout was called. The sutures were removed the wound VAC sponge was removed. Possibly 90% of the wound bed had good granulation tissue. The remainder areas had the muscle and soft tissue resected back to healthy bleeding tissue. Electrocautery was used for hemostasis. The wound was irrigated with pulsatile lavage 3 L. The wound was soaked with 2 g of TXA. The incision was closed using 2-0 nylon. An incisional wound VAC was applied. Patient was extubated taken the PACU in stable condition.

## 2017-02-23 NOTE — Transfer of Care (Signed)
Immediate Anesthesia Transfer of Care Note  Patient: Derrick [name]  Procedure(s) Performed: Procedure(s): REPEAT IRRIGATION AND DEBRIDEMENT RIGHT HIP WITH WOUND CLOSURE AND INCISIONAL VAC PLACEMENT (Right)  Patient Location: PACU  Anesthesia Type:General  Level of Consciousness: sedated and patient cooperative  Airway & Oxygen Therapy: Patient Spontanous Breathing and Patient connected to face mask oxygen  Post-op Assessment: Report given to RN and Post -op Vital signs reviewed and stable  Post vital signs: Reviewed and stable  Last Vitals:  Vitals:   02/23/17 0257 02/23/17 1320  BP: 93/61   Pulse: 99   Resp: 16   Temp: 36.7 C (P) 36.7 C    Last Pain:  Vitals:   02/23/17 0715  TempSrc:   PainSc: 0-No pain      Patients Stated Pain Goal: 0 (02/22/17 1500)  Complications: No apparent anesthesia complications

## 2017-02-23 NOTE — Interval H&P Note (Signed)
History and Physical Interval Note:  02/23/2017 6:17 AM  Derrick RoseBobby L Hallgren  has presented today for surgery, with the diagnosis of Abscess Right Hip  The various methods of treatment have been discussed with the patient and family. After consideration of risks, benefits and other options for treatment, the patient has consented to  Procedure(s): REPEAT IRRIGATION AND DEBRIDEMENT RIGHT HIP (Right) as a surgical intervention .  The patient's history has been reviewed, patient examined, no change in status, stable for surgery.  I have reviewed the patient's chart and labs.  Questions were answered to the patient's satisfaction.     Nadara MustardMarcus V Duda

## 2017-02-23 NOTE — Progress Notes (Signed)
PPT Cancellation Note  Patient Details Name: Derrick [name] MRN: [id number] DOB: 04/07/1971   Cancelled Treatment:    Reason Eval/Treat Not Completed: Patient at procedure or test/unavailable Pt going to OR for I&D. PT will check on pt later as time allows.    Derek MoundKellyn R Chanah Tidmore Laneisha Mino, PTA Pager: [phone]   02/23/2017, 10:39 AM

## 2017-02-23 NOTE — Anesthesia Procedure Notes (Signed)
Procedure Name: LMA Insertion Date/Time: 02/23/2017 12:25 PM Performed by: Wilder GladeWINN, Derrick Soltys G Pre-anesthesia Checklist: Patient identified, Emergency Drugs available, Suction available, Patient being monitored and Timeout performed Patient Re-evaluated:Patient Re-evaluated prior to inductionOxygen Delivery Method: Circle system utilized Preoxygenation: Pre-oxygenation with 100% oxygen Intubation Type: IV induction LMA: LMA inserted LMA Size: 5.0 Number of attempts: 1 Placement Confirmation: positive ETCO2 and breath sounds checked- equal and bilateral ETT to lip (cm): yes. Tube secured with: Tape Dental Injury: Teeth and Oropharynx as per pre-operative assessment

## 2017-02-23 NOTE — Progress Notes (Signed)
OT Cancellation Note  Patient Details Name: Berneda RoseBobby L Ray MRN: [id number] DOB: 11/29/1971   Cancelled Treatment:    Reason Eval/Treat Not Completed: Patient at procedure or test/ unavailable. Pt off floor to radiology, then OR today for I & D  Galen ManilaSpencer, Colton Engdahl Jeanette 02/23/2017, 11:00 AM

## 2017-02-23 NOTE — Anesthesia Postprocedure Evaluation (Signed)
Anesthesia Post Note  Patient: Derrick [name]  Procedure(s) Performed: Procedure(s) (LRB): REPEAT IRRIGATION AND DEBRIDEMENT RIGHT HIP WITH WOUND CLOSURE AND INCISIONAL VAC PLACEMENT (Right)  Patient location during evaluation: PACU Anesthesia Type: General Level of consciousness: awake and alert Pain management: pain level controlled Vital Signs Assessment: post-procedure vital signs reviewed and stable Respiratory status: spontaneous breathing, nonlabored ventilation, respiratory function stable and patient connected to nasal cannula oxygen Cardiovascular status: blood pressure returned to baseline and stable Postop Assessment: no signs of nausea or vomiting Anesthetic complications: no       Last Vitals:  Vitals:   02/23/17 1346 02/23/17 1405  BP:  (!) 147/92  Pulse:  90  Resp:  20  Temp: 36.1 C 36.6 C    Last Pain:  Vitals:   02/23/17 0715  TempSrc:   PainSc: 0-No pain                 Kennieth RadFitzgerald, Gurkaran Rahm E

## 2017-02-23 NOTE — H&P (View-Only) (Signed)
Patient ID: Derrick RoseBobby L Oshields, male   DOB: 03/31/1971, 46 y.o.   MRN: [id number] The infusion wound VAC is working well patient has no complaints he is pleasant this morning. Plan for repeat irrigation debridement right hip tomorrow. If there is healthy viable granulation tissue we will plan for wound closure.

## 2017-02-24 ENCOUNTER — Encounter (HOSPITAL_COMMUNITY): Payer: Self-pay | Admitting: Orthopedic Surgery

## 2017-02-24 LAB — CBC
HCT: 26.2 % — ABNORMAL LOW (ref 39.0–52.0)
Hemoglobin: 8.3 g/dL — ABNORMAL LOW (ref 13.0–17.0)
MCH: 26.5 pg (ref 26.0–34.0)
MCHC: 31.7 g/dL (ref 30.0–36.0)
MCV: 83.7 fL (ref 78.0–100.0)
PLATELETS: 656 10*3/uL — AB (ref 150–400)
RBC: 3.13 MIL/uL — ABNORMAL LOW (ref 4.22–5.81)
RDW: 14.7 % (ref 11.5–15.5)
WBC: 13.8 10*3/uL — ABNORMAL HIGH (ref 4.0–10.5)

## 2017-02-24 LAB — BASIC METABOLIC PANEL
Anion gap: 9 (ref 5–15)
BUN: 5 mg/dL — AB (ref 6–20)
CALCIUM: 8.2 mg/dL — AB (ref 8.9–10.3)
CO2: 32 mmol/L (ref 22–32)
Chloride: 97 mmol/L — ABNORMAL LOW (ref 101–111)
Creatinine, Ser: 0.74 mg/dL (ref 0.61–1.24)
GFR calc Af Amer: >60 mL/min (ref >60)
GLUCOSE: 100 mg/dL — AB (ref 65–99)
Potassium: 3.9 mmol/L (ref 3.5–5.1)
SODIUM: 138 mmol/L (ref 135–145)

## 2017-02-24 LAB — TYPE AND SCREEN
ABO/RH(D): A POS
Antibody Screen: NEGATIVE
Unit division: 0

## 2017-02-24 LAB — CULTURE, BLOOD (ROUTINE X 2)
CULTURE: NO GROWTH
Culture: NO GROWTH

## 2017-02-24 LAB — BPAM RBC
Blood Product Expiration Date: 201803292359
ISSUE DATE / TIME: 201803090050
Unit Type and Rh: 6200

## 2017-02-24 LAB — GLUCOSE, CAPILLARY
GLUCOSE-CAPILLARY: 81 mg/dL (ref 65–99)
GLUCOSE-CAPILLARY: 134 mg/dL — AB (ref 65–99)
GLUCOSE-CAPILLARY: 124 mg/dL — AB (ref 65–99)
Glucose-Capillary: 213 mg/dL — ABNORMAL HIGH (ref 65–99)

## 2017-02-24 MED ORDER — INSULIN GLARGINE 100 UNIT/ML ~~LOC~~ SOLN
25.0000 [IU] | Freq: Every day | SUBCUTANEOUS | Status: DC
  Administered 2017-02-24: 25 [IU] via SUBCUTANEOUS
  Filled 2017-02-24: qty 0.25

## 2017-02-24 NOTE — Progress Notes (Signed)
Patient ID: Derrick [name], male   DOB: 07/23/1971, 46 y.o.   MRN: [id number] Postoperative day 1 repeat irrigation debridement right hip disarticulation. There is a small amount of clear serous drainage in the canister for the wound VAC. This is most likely V TXA that was placed in the wound. Patient states he does have some pain that has reoccurred and is concerned that this may be an infection. We will repeat his blood work. Patient has refused to go to inpatient rehabilitation. Prefers discharge to home.

## 2017-02-24 NOTE — Progress Notes (Signed)
Family Medicine Teaching Service CONSULT NOTE Intern Pager: [phone]  Patient name: Derrick [name] Medical record number: [id number] Date of birth: 02/14/1971 Age: 46 y.o. Gender: male  Primary Care Provider: Mickie HillierIan McKeag, MD  Pt Overview and Major Events to Date:  3/2: Revision of R hip disarticulation with placement of wound vac w/ Dr. Lajoyce Cornersuda  Assessment and Plan: Derrick [name] is a 46 year old male with a PMH of T2DM, HTN, tobacco use, L BKA, R AKA.  Recurrent Joint Infection of R Hip, stable: Pain moderately controlled. POD#1 irrigation debridement right hip disarticulation - On Ancef 3/2-3/5, then switched to Cefepime 3/5. Switch to Zosyn, as wound cultures growing Prevotella and GBS. - Blood cultures- No growth Final - pt declined CIR - pain control per Orthopedics  Severe sepsis with septic shock, resolved: Euvolemic volume status.  - SLIV and continue to encourage PO intake - Holding home Lisinopril.  Normocytic Anemia, stable: S/p 4U PRBC during admission 8.1 yesterday morning up to 8.3 this AM - Daily CBCs   Hypokalemia: Pending - Check BMET in the AM   Diabetes mellitus,uncontrolled. Preprandial this AM to 81 this morning. Complaining of dizziness overnight.  - will decrease to 25U lantus - continue mSSI - Stop glipizide while hospitalized  Tobacco use - Nicotine patch  FEN/GI: NPO for procedure PPx: holding heparin  Disposition: Probable DC 3/12. Pain control  Subjective:  Continues to worry about pain control upon discharge and anxiety management. Received first dose of buspar last night.   Objective: Temp:  [97 F (36.1 C)-98.8 F (37.1 C)] 98.8 F (37.1 C) (03/10 0605) Pulse Rate:  [61-108] 108 (03/10 0605) Resp:  [12-20] 16 (03/10 0605) BP: (99-147)/(69-92) 115/80 (03/10 0605) SpO2:  [94 %-100 %] 95 % (03/10 29560605) Physical Exam: General: Awake, alert, NAD Cardiovascular: RRR, no murmurs Respiratory: Normal work of breathing Abdomen: +BS, soft,  non-tender, non-distended Extremities: s/p R hip disarticulation with wound vac in place, well-healed L BKA  Laboratory:  Recent Labs Lab 02/22/17 1427 02/23/17 0613 02/24/17 0828  WBC 11.8* 11.2* 13.8*  HGB 7.0* 8.1* 8.3*  HCT 21.6* 24.9* 26.2*  PLT 550* 576* 656*    Recent Labs Lab 02/19/17 0816 02/19/17 1537  02/22/17 0839 02/22/17 1427 02/23/17 0613  NA 131* 132*  < > 137 138 140  K 3.7 3.3*  < > 3.8 3.5 3.4*  CL 97* 95*  < > 101 101 102  CO2 28 28  < > 29 30 30   BUN 8 7  < > <5* <5* <5*  CREATININE 0.74 0.68  < > 0.78 0.72 0.73  CALCIUM 7.8* 7.6*  < > 7.8* 7.9* 7.8*  PROT 6.5 6.0*  --   --   --   --   BILITOT 0.3 0.3  --   --   --   --   ALKPHOS 323* 284*  --   --   --   --   ALT 20 22  --   --   --   --   AST 32 39  --   --   --   --   GLUCOSE 182* 102*  < > 137* 133* 89  < > = values in this interval not displayed.  Renne Muscaaniel L Warden, MD 02/24/2017, 9:25 AM PGY-1, [facility] FPTS Intern pager: [phone], text pages welcome

## 2017-02-24 NOTE — Progress Notes (Signed)
Late entry from 3/9  Yesterday morning I rounded on Derrick [name] to follow up on his blood pressure and diabetes. We are being consulted by Dr. Lajoyce Cornersuda for medical management of these chronic problems. At that time his disposition was dependent on his acceptance to CIR and pain management. Apparently he was told that he would not be able to smoke cigarettes and stated that he will not be going if that is the case.  His girlfriend was also present in the room and stated that he needs to go to CIR.  I agreed with her, but added that he is competent to make his own decisions and if he wants to be discharged home, that is his decision to make.  She became very angry and stormed out of the room.  Mr Derrick [name] became angry and yelled for me to "get the fuck out of my room", and I left.    I came back in the afternoon to attempt to diffuse the situation and apologize.  He was still angry and stated "I just want you to know, you're lucky, you're very lucky".  I ignored the statement and discussed his medical plan and told him that I would follow up with him in the morning.   Derrick [name] L. Myrtie SomanWarden, MD [facility] Resident PGY-1 02/24/2017 10:30 AM

## 2017-02-24 NOTE — Progress Notes (Signed)
Occupational Therapy Treatment Patient Details Name: Derrick [name] MRN: [id number] DOB: 04-14-1971 Today's Date: 02/24/2017    History of present illness Presented in Ortho follow up with abscess post R hip disarticulation; now s/p I&D and wound VAC placement;  has a past medical history of Anemia; Arthritis; Diabetes mellitus; GERD (gastroesophageal reflux disease); Neuropathy (HCC); Peripheral neuropathy (HCC); Pneumonia; and Prostatitis. 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 OT spent extensive time in room providing education and options for safe transfers and energy conservation. Pt declined mobility this session despite max encouragement throughout session as pt has had falls at home prior to admission and would benefit. Pt continues to be easily agitated/angry. Fiancee present and pt and fiancee verbalized understanding of education. Next session to focus on Pt practicing A/P transfer and use of BSC.   Follow Up Recommendations  Home health OT;Supervision/Assistance - 24 hour (Pt declining CIR "No, not If they won't let me smoke")    Equipment Recommendations  3 in 1 bedside commode;Hospital bed    Recommendations for Other Services      Precautions / Restrictions Precautions Precautions: Fall Precaution Comments: bilateral amputee Required Braces or Orthoses: Other Brace/Splint Other Brace/Splint: Lt BKA prosthesis  Restrictions Weight Bearing Restrictions: No       Mobility Bed Mobility               General bed mobility comments: Pt declined any bed mobility at this time due to pain. - Please see general ADL comments for more info  Transfers                 General transfer comment: pt declined mobility    Balance                                   ADL Overall ADL's : Needs assistance/impaired     Grooming: Set up;Bed level                   Toilet Transfer: Anterior/posterior;BSC;Moderate assistance Toilet Transfer Details (indicate cue type and reason): Discussed toilet transfers at length with Pt, at home he uses his power wheelchair and transfers to the toilet using support from his sink counter, educated on AP transfers from bed to [facility] while continuing hospital stay to save with line management           General ADL Comments: Pt very agitated about pain and refused to attempt any movement, Pt educated on benefits of sitting upright (digestion, pneumonia prevention, etc) and Pt asked "I KNOW, everyone keeps telling me that, but I have a tube coming out of where my tailbone is and it hurts like hell to sit on"      Vision                     Perception     Praxis      Cognition   Behavior During Therapy: [facility] for tasks assessed/performed;Agitated Overall Cognitive Status: Within Functional Limits for tasks assessed                  General Comments: pt continues to anger easily; when PTA moved IV pole pt yelled and said that therapist had pulled the "needle in my arm" although the line still had a lot of slack and did  not appear to have pulled at the IV site      Exercises     Shoulder Instructions       General Comments      Pertinent Vitals/ Pain       Pain Assessment: 0-10 Pain Score: 10-Worst pain ever Faces Pain Scale: Hurts a little bit Pain Location: Rt buttock/LE Pain Descriptors / Indicators: Sore (phantom limb pains) Pain Intervention(s): Premedicated before session (Educated in massage technique for phantom limb pains)  Home Living                                          Prior Functioning/Environment              Frequency  Min 2X/week        Progress Toward Goals  OT Goals(current goals can now be found in the care plan section)  Progress towards OT goals: Progressing toward goals  Acute Rehab OT Goals Patient Stated  Goal: to smoke a cigarette OT Goal Formulation: With patient Time For Goal Achievement: 03/03/17 Potential to Achieve Goals: Good  Plan Discharge plan needs to be updated;Equipment recommendations need to be updated    Co-evaluation    PT/OT/SLP Co-Evaluation/Treatment: Yes Reason for Co-Treatment: Complexity of the patient's impairments (multi-system involvement);For patient/therapist safety (Pt's activity tolerance) PT goals addressed during session: Mobility/safety with mobility;Proper use of DME OT goals addressed during session: ADL's and self-care;Proper use of Adaptive equipment and DME      End of Session    OT Visit Diagnosis: Pain;Muscle weakness (generalized) (M62.81);Other abnormalities of gait and mobility (R26.89) Pain - Right/Left: Right Pain - part of body: Hip   Activity Tolerance Patient limited by pain;Treatment limited secondary to agitation   Patient Left in bed;with call bell/phone within reach;with bed alarm set;with family/visitor present   Nurse Communication Mobility status        Time: [id number] OT Time Calculation (min): 38 min  Charges: OT General Charges $OT Visit: 1 Procedure OT Treatments $Self Care/Home Management : 8-22 mins  Sherryl MangesLaura Traves Majchrzak OTR/L [phone]  Derrick [name] 02/24/2017, 5:09 PM

## 2017-02-24 NOTE — Progress Notes (Signed)
Physical Therapy Treatment Patient Details Name: Derrick RoseBobby L [name] MRN: [id number] DOB: 01/31/1971 Today's Date: 02/24/2017    History of Present Illness Presented in Ortho follow up with abscess post R hip disarticulation; now s/p I&D and wound VAC placement;  has a past medical history of Anemia; Arthritis; Diabetes mellitus; GERD (gastroesophageal reflux disease); Neuropathy (HCC); Peripheral neuropathy (HCC); Pneumonia; and Prostatitis. 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 Pt may benefit from pressure relief air cushion to decrease risk of further pressure sores. Pt declined mobility this session despite max encouragement throughout session as pt has had falls at home prior to admission and would benefit. Pt continues to be easily agitated/angry. Fiancee present and pt and fiancee verbalized understanding of education.    Follow Up Recommendations  HHPT; Supervision/Assistance 24 hour     Equipment Recommendations  Wheelchair (measurements PT);Wheelchair cushion (measurements PT)    Recommendations for Other Services       Precautions / Restrictions Precautions Precautions: Fall Precaution Comments: bilateral amputee Required Braces or Orthoses: Other Brace/Splint Other Brace/Splint: Lt BKA prosthesis     Mobility  Bed Mobility                  Transfers                 General transfer comment: pt declined mobility  Ambulation/Gait                 Stairs            Wheelchair Mobility    Modified Rankin (Stroke Patients Only)       Balance                                    Cognition Arousal/Alertness: Awake/alert Behavior During Therapy: WFL for tasks assessed/performed;Agitated Overall Cognitive Status:  Within Functional Limits for tasks assessed                 General Comments: pt continues to anger easily; when therapist moved IV pole pt yelled and said that therapist had pulled the "needle in my arm" although the line still had a lot of slack and did not appear to have pulled at the IV site    Exercises      General Comments General comments (skin integrity, edema, etc.): Pt declined any mobility today due to c/o pain. However therapist discussed at length home set up, equipment needs, and safety with different types of transfers including AP transfer and squat pivot with demonstration. Pt and fiancee verbalized understanding. Therapist encouraged increased mobility and educated pt on benefits of mobility. Also educated pt on massage to decrease sensitivity and phantom limb pains on R side      Pertinent Vitals/Pain Pain Assessment: Faces Faces Pain Scale: Hurts a little bit Pain Location: Rt buttock/LE Pain Descriptors / Indicators: Sore (phantom limb pains) Pain Intervention(s): Premedicated before session    Home Living                      Prior Function            PT Goals (current goals can now be found in the care plan section) Acute Rehab PT  Goals PT Goal Formulation: With patient Time For Goal Achievement: 03/04/17 Potential to Achieve Goals: Good Progress towards PT goals: Not progressing toward goals - comment    Frequency    Min 3X/week      PT Plan Discharge plan needs to be updated    Co-evaluation PT/OT/SLP Co-Evaluation/Treatment: Yes Reason for Co-Treatment: For patient/therapist safety;Complexity of the patient's impairments (multi-system involvement);Other (comment) (pt's activity tolerance) PT goals addressed during session: Mobility/safety with mobility;Proper use of DME       End of Session   Activity Tolerance: Patient tolerated treatment well Patient left: with call bell/phone within reach;with family/visitor present;in  bed Nurse Communication: Mobility status PT Visit Diagnosis: Muscle weakness (generalized) (M62.81);Pain Pain - Right/Left: Right Pain - part of body: Hip     Time: 3086-5784 PT Time Calculation (min) (ACUTE ONLY): 38 min  Charges:  $Therapeutic Activity: 8-22 mins                    G Codes:       Derek Mound, PTA Pager: [phone]   02/24/2017, 4:14 PM

## 2017-02-25 DIAGNOSIS — I10 Essential (primary) hypertension: Secondary | ICD-10-CM

## 2017-02-25 LAB — CBC
HEMATOCRIT: 25.5 % — AB (ref 39.0–52.0)
Hemoglobin: 8 g/dL — ABNORMAL LOW (ref 13.0–17.0)
MCH: 26.2 pg (ref 26.0–34.0)
MCHC: 31.4 g/dL (ref 30.0–36.0)
MCV: 83.6 fL (ref 78.0–100.0)
PLATELETS: 672 10*3/uL — AB (ref 150–400)
RBC: 3.05 MIL/uL — ABNORMAL LOW (ref 4.22–5.81)
RDW: 14.7 % (ref 11.5–15.5)
WBC: 12.6 10*3/uL — AB (ref 4.0–10.5)

## 2017-02-25 LAB — BASIC METABOLIC PANEL
ANION GAP: 8 (ref 5–15)
BUN: 7 mg/dL (ref 6–20)
CALCIUM: 8 mg/dL — AB (ref 8.9–10.3)
CO2: 32 mmol/L (ref 22–32)
Chloride: 97 mmol/L — ABNORMAL LOW (ref 101–111)
Creatinine, Ser: 0.77 mg/dL (ref 0.61–1.24)
GFR calc Af Amer: >60 mL/min (ref >60)
GLUCOSE: 120 mg/dL — AB (ref 65–99)
POTASSIUM: 3.4 mmol/L — AB (ref 3.5–5.1)
Sodium: 137 mmol/L (ref 135–145)

## 2017-02-25 LAB — GLUCOSE, CAPILLARY
GLUCOSE-CAPILLARY: 74 mg/dL (ref 65–99)
Glucose-Capillary: 102 mg/dL — ABNORMAL HIGH (ref 65–99)
Glucose-Capillary: 110 mg/dL — ABNORMAL HIGH (ref 65–99)
Glucose-Capillary: 192 mg/dL — ABNORMAL HIGH (ref 65–99)

## 2017-02-25 MED ORDER — INSULIN GLARGINE 100 UNIT/ML ~~LOC~~ SOLN
20.0000 [IU] | Freq: Every day | SUBCUTANEOUS | Status: DC
  Administered 2017-02-25: 20 [IU] via SUBCUTANEOUS
  Filled 2017-02-25: qty 0.2

## 2017-02-25 MED ORDER — POTASSIUM CHLORIDE CRYS ER 20 MEQ PO TBCR
40.0000 meq | EXTENDED_RELEASE_TABLET | Freq: Once | ORAL | Status: AC
  Administered 2017-02-25: 40 meq via ORAL
  Filled 2017-02-25: qty 2

## 2017-02-25 NOTE — Progress Notes (Addendum)
Patient ID: Derrick [name], male   DOB: 11/03/1971, 46 y.o.   MRN: [id number] Patient's white blood cell count is still elevated. There is no new drainage and the wound VAC. Patient moves both upper extremities well this morning. Possible discharge to home on Monday.  Patient's abscess right hip disarticulation and sepsis was present on admission.

## 2017-02-25 NOTE — Progress Notes (Signed)
Family Medicine Teaching Service CONSULT NOTE Intern Pager: [phone]  Patient name: Derrick RoseBobby L [name] Medical record number: [id number] Date of birth: 04/12/1971 Age: 46 y.o. Gender: male  Primary Care Provider: Mickie HillierIan McKeag, MD  Pt Overview and Major Events to Date:  3/2: Revision of R hip disarticulation with placement of wound vac w/ Dr. Lajoyce Cornersuda  Assessment and Plan: Derrick [name] is a 46 year old male with a PMH of T2DM, HTN, tobacco use, L BKA, R AKA.  Recurrent Joint Infection of R Hip, stable: Pain moderately controlled. POD#2 irrigation debridement right hip disarticulation. Wound cultures growing GBS and Prevotella. WBC trending down. - On Ancef 3/2-3/5, then switched to Cefepime 3/5. Has been on Zosyn since 3/6. Transition to Augmentin today.  - pain control per Orthopedics  Mild Hypotension. Improving. Initially thought to be 2/2 severe sepsis with septic shock. - SLIV and continue to encourage PO intake - Continuing  Normocytic Anemia, stable: S/p 4U PRBC this admission. Hgb 8.0. - Daily CBCs   Hypokalemia: K 3.4 this am. - Replace K - Daily BMETs  Diabetes mellitus, uncontrolled. Takes NPH 7 u bid at home. CBGs 102-213. - Lantus 25 units qhs - continue mSSI - Hold glipizide while hospitalized  Tobacco use - Nicotine patch  FEN/GI: Carb modified diet  Disposition: Will transition to PO abx today and watch overnight. Think he may be stable for discharge 3/12. Will need to resume home health on discharge.  Subjective:  Pt doing fine this morning. States he is ready to go home tomorrow. Wants to make sure we know that he needs pain medications when he goes home.  Objective: Temp:  [98.4 F (36.9 C)-99.7 F (37.6 C)] 98.4 F (36.9 C) (03/11 0641) Pulse Rate:  [101-110] 101 (03/11 0641) Resp:  [16-18] 18 (03/11 0118) BP: (100-136)/(65-75) 107/74 (03/11 0641) SpO2:  [96 %-97 %] 96 % (03/11 0641) Physical Exam: General: Sitting up in bed, talkative, in  NAD Cardiovascular: RRR, no murmurs Respiratory: Normal work of breathing, lungs mildly diminished in the bases but clear Abdomen: +BS, soft, non-tender, non-distended Extremities: s/p R hip disarticulation with wound vac in place, well-healed L BKA  Laboratory:  Recent Labs Lab 02/23/17 0613 02/24/17 0828 02/25/17 0426  WBC 11.2* 13.8* 12.6*  HGB 8.1* 8.3* 8.0*  HCT 24.9* 26.2* 25.5*  PLT 576* 656* 672*    Recent Labs Lab 02/19/17 0816 02/19/17 1537  02/23/17 0613 02/24/17 0828 02/25/17 0426  NA 131* 132*  < > 140 138 137  K 3.7 3.3*  < > 3.4* 3.9 3.4*  CL 97* 95*  < > 102 97* 97*  CO2 28 28  < > 30 32 32  BUN 8 7  < > <5* 5* 7  CREATININE 0.74 0.68  < > 0.73 0.74 0.77  CALCIUM 7.8* 7.6*  < > 7.8* 8.2* 8.0*  PROT 6.5 6.0*  --   --   --   --   BILITOT 0.3 0.3  --   --   --   --   ALKPHOS 323* 284*  --   --   --   --   ALT 20 22  --   --   --   --   AST 32 39  --   --   --   --   GLUCOSE 182* 102*  < > 89 100* 120*  < > = values in this interval not displayed.  Campbell StallKaty Dodd Javonda Suh, MD 02/25/2017, 9:05 AM PGY-2, [facility]  [place] Intern pager: [phone], text pages welcome

## 2017-02-26 ENCOUNTER — Telehealth: Payer: Self-pay | Admitting: Internal Medicine

## 2017-02-26 ENCOUNTER — Ambulatory Visit: Payer: Medicare Other | Admitting: Family Medicine

## 2017-02-26 ENCOUNTER — Ambulatory Visit (INDEPENDENT_AMBULATORY_CARE_PROVIDER_SITE_OTHER): Payer: Medicare Other | Admitting: Orthopedic Surgery

## 2017-02-26 LAB — CBC
HCT: 26.5 % — ABNORMAL LOW (ref 39.0–52.0)
Hemoglobin: 8.3 g/dL — ABNORMAL LOW (ref 13.0–17.0)
MCH: 26.1 pg (ref 26.0–34.0)
MCHC: 31.3 g/dL (ref 30.0–36.0)
MCV: 83.3 fL (ref 78.0–100.0)
PLATELETS: 685 10*3/uL — AB (ref 150–400)
RBC: 3.18 MIL/uL — ABNORMAL LOW (ref 4.22–5.81)
RDW: 14.9 % (ref 11.5–15.5)
WBC: 13 10*3/uL — ABNORMAL HIGH (ref 4.0–10.5)

## 2017-02-26 LAB — GLUCOSE, CAPILLARY
GLUCOSE-CAPILLARY: 94 mg/dL (ref 65–99)
Glucose-Capillary: 78 mg/dL (ref 65–99)

## 2017-02-26 LAB — BASIC METABOLIC PANEL
Anion gap: 8 (ref 5–15)
BUN: 6 mg/dL (ref 6–20)
CO2: 31 mmol/L (ref 22–32)
CREATININE: 0.87 mg/dL (ref 0.61–1.24)
Calcium: 8.2 mg/dL — ABNORMAL LOW (ref 8.9–10.3)
Chloride: 99 mmol/L — ABNORMAL LOW (ref 101–111)
GFR calc Af Amer: >60 mL/min (ref >60)
Glucose, Bld: 120 mg/dL — ABNORMAL HIGH (ref 65–99)
Potassium: 4.3 mmol/L (ref 3.5–5.1)
SODIUM: 138 mmol/L (ref 135–145)

## 2017-02-26 MED ORDER — AMOXICILLIN-POT CLAVULANATE 875-125 MG PO TABS
1.0000 | ORAL_TABLET | Freq: Two times a day (BID) | ORAL | Status: DC
  Administered 2017-02-26: 1 via ORAL
  Filled 2017-02-26: qty 1

## 2017-02-26 MED ORDER — TRAZODONE HCL 50 MG PO TABS: 25.0000 mg | ORAL_TABLET | Freq: Every evening | ORAL | 0 refills | Status: AC | PRN

## 2017-02-26 MED ORDER — OXYCODONE-ACETAMINOPHEN 10-325 MG PO TABS: 1.0000 | ORAL_TABLET | ORAL | 0 refills | Status: DC | PRN

## 2017-02-26 MED ORDER — HYDROXYZINE HCL 10 MG PO TABS: 10.0000 mg | ORAL_TABLET | Freq: Three times a day (TID) | ORAL | 0 refills | Status: AC | PRN

## 2017-02-26 MED ORDER — BUSPIRONE HCL 7.5 MG PO TABS: 7.5000 mg | ORAL_TABLET | Freq: Two times a day (BID) | ORAL | 0 refills | Status: AC

## 2017-02-26 NOTE — Progress Notes (Signed)
PT Cancellation Note  Patient Details Name: Derrick [name] MRN: [id number] DOB: 07/13/1971   Cancelled Treatment:    Reason Eval/Treat Not Completed: Patient declined, no reason specified Pt agitated with PT saying he was not participating and wanted to see the RN for d/c. Notified RN. Will reattempt as schedule allows.    Margot ChimesSmith, Tsuneo Faison Leanne 02/26/2017, 10:17 AM   Margot ChimesBrittany Smith, PT, DPT  Acute Rehabilitation Services  Pager: [phone]

## 2017-02-26 NOTE — Care Management Note (Signed)
Case Management Note  Patient Details  Name: Derrick [name] MRN: [id number] Date of Birth: 08/27/1971  Subjective/Objective:   46 yr old male admitted with right BKA infection, patient had a  right hip disarticulation.                                                                                                                                                                                                                                                                                                                                                                                                                                                                                                                                                                      Action/Plan: Case manager spoke with patient concerning Home Health needs. Patient states he is  already setup with a HH agency. CM spoke with [facility] to confirm. CM will fax orders, H&P to [facility]. Patient will have family support at discharge. Patient has all DME from previous hospitalization.    Expected Discharge Date:  02/26/17               Expected Discharge Plan:  Home w Home Health Services  In-House Referral:  NA  Discharge planning Services  CM Consult  Post Acute Care Choice:  Home Health Choice offered to:  Patient  DME Arranged:    DME Agency:     HH Arranged:  OT, PT HH Agency:  [facility]  Status of Service:  Completed, signed off  If discussed at Long Length of Stay Meetings, dates discussed:    Additional Comments:  Durenda GuthrieBrady, Rashun Grattan Naomi, RN 02/26/2017, 10:26 AM

## 2017-02-26 NOTE — Progress Notes (Addendum)
Patient seen by me this morning, he denies any concern. He is ready for d/c home per Dr. Sharol Given. Asking who is going to remove his wound vac  No current facility-administered medications on file prior to encounter.    Current Outpatient Prescriptions on File Prior to Encounter  Medication Sig Dispense Refill  . amoxicillin-clavulanate (AUGMENTIN) 875-125 MG tablet Take 1 tablet by mouth 2 (two) times daily. (Patient taking differently: Take 1 tablet by mouth 2 (two) times daily. 28 day course filled 02/11/17) 56 tablet 0  . gabapentin (NEURONTIN) 100 MG capsule Take 1 capsule (100 mg total) by mouth 3 (three) times daily. 90 capsule 0  . glipiZIDE (GLUCOTROL) 5 MG tablet Take 0.5 tablets (2.5 mg total) by mouth daily before breakfast. 15 tablet 0  . insulin NPH Human (NOVOLIN N) 100 UNIT/ML injection Please inject 7 units subcutaneously the morning and 7 units in the evening. (Patient taking differently: Inject 7 Units into the skin 2 (two) times daily before a meal. ) 3 mL 3  . methocarbamol (ROBAXIN) 500 MG tablet Take 1 tablet (500 mg total) by mouth every 6 (six) hours as needed for muscle spasms. 30 tablet 0  . promethazine (PHENERGAN) 25 MG tablet Take 25 mg by mouth every 6 (six) hours as needed for nausea or vomiting.    Marland Kitchen acetaminophen (TYLENOL) 325 MG tablet Take 2 tablets (650 mg total) by mouth every 6 (six) hours as needed for mild pain or fever (or Fever >/= 101). Acetaminophen dose from all sources not to exceed 4 g per day. (Patient not taking: Reported on 02/17/2017)    . blood glucose meter kit and supplies KIT Dispense based on patient and insurance preference. Use up to four times daily as directed. (FOR ICD-9 250.00, 250.01). 1 each 0  . Insulin Pen Needle (PEN NEEDLES 3/16") 31G X 5 MM MISC Use as directed 3 times daily with meals and at bedtime. 120 each 0  . lisinopril (PRINIVIL,ZESTRIL) 10 MG tablet Take 1 tablet (10 mg total) by mouth daily. (Patient not taking: Reported on  02/17/2017) 30 tablet 0  . nicotine (NICODERM CQ - DOSED IN MG/24 HOURS) 21 mg/24hr patch Place 1 patch (21 mg total) onto the skin daily. (Patient not taking: Reported on 02/17/2017) 28 patch 0  . Saccharomyces boulardii (PROBIOTIC) 250 MG CAPS Take 1 capsule by mouth daily. (Patient not taking: Reported on 02/17/2017) 42 capsule 0   Past Medical History:  Diagnosis Date  . Anemia   . Arthritis   . Diabetes mellitus    Type II  . GERD (gastroesophageal reflux disease)   . Neuropathy ([place])   . Peripheral neuropathy ([place])   . Pneumonia   . Prostatitis    Vitals:   02/24/17 2111 02/25/17 0118 02/25/17 0641 02/25/17 2128  BP: 136/75 100/65 107/74 120/79  Pulse: (!) 110 (!) 106 (!) 101 (!) 101  Resp:  18  17  Temp: 99 F (37.2 C)  98.4 F (36.9 C) 98.4 F (36.9 C)  TempSrc: Oral  Oral Oral  SpO2: 97% 97% 96% 99%  Weight:      Height:       No results found.   Exam: Gen: Calm in bed, not interested in conversing with me Cardiovascular: RRR, no murmurs Respiratory: Normal work of breathing, lungs mildly diminished in the bases but clear Abdomen: +BS, soft, non-tender, non-distended Extremities: s/p R hip disarticulation with wound vac in place. Left BKA   A/P:  Recurrent Joint Infection  of R Hip, stable: S/P sepsis which has now resolved Mostly managed by Dr. Sharol Given. He is now on oral A/B. Doing well with no acute findings. Discharge plan and follow up plan per Dr. Sharol Given.  Medical problem: HTN/DM2 BP holding up fine. I agree with current insulin regimen with SSI May restart home regimen upon d/c with close PCP follow up.  Normocytic Anemia, stable: S/p 4U PRBC this admission. Daily CBCs  Hbg= 8.3 today which seems to be baseline. Monitor for stability.  Stable from medical standpoint. We will monitor him along with orthopedic.

## 2017-02-26 NOTE — Discharge Summary (Signed)
Discharge Diagnoses:  Principal Problem:   Severe sepsis with septic shock ([name]) Active Problems:   Type 2 diabetes mellitus with complication, with long-term current use of insulin ([facility])   Tobacco abuse   HTN (hypertension)   History of disarticulation of right hip   Postoperative infection   Surgeries: Procedure(s): REPEAT IRRIGATION AND DEBRIDEMENT RIGHT HIP WITH WOUND CLOSURE AND INCISIONAL VAC PLACEMENT on 02/16/2017 - 02/23/2017    Consultants: Treatment Team:  Derrick Reasons, MD  Discharged Condition: Improved  Hospital Course: Derrick [name] is an 46 y.o. male who was admitted 02/16/2017 with a chief complaint of sepsis with abscess right hip disarticulation, with a final diagnosis of Abscess Right Hip.  Patient was brought to the operating room on 02/16/2017 - 02/23/2017 and underwent Procedure(s): REPEAT IRRIGATION AND DEBRIDEMENT RIGHT HIP WITH WOUND CLOSURE AND INCISIONAL VAC PLACEMENT.    Patient was given perioperative antibiotics: Anti-infectives    Start     Dose/Rate Route Frequency Ordered Stop   02/26/17 0300  amoxicillin-clavulanate (AUGMENTIN) 875-125 MG per tablet 1 tablet     1 tablet Oral Every 12 hours 02/26/17 0253     02/23/17 1230  ceFAZolin (ANCEF) IVPB 2g/100 mL premix  Status:  Discontinued     2 g 200 mL/hr over 30 Minutes Intravenous To ShortStay Surgical 02/22/17 0845 02/22/17 0947   02/23/17 1230  ceFAZolin (ANCEF) IVPB 2g/100 mL premix     2 g 200 mL/hr over 30 Minutes Intravenous To ShortStay Surgical 02/22/17 0947 02/23/17 1227   02/21/17 0030  piperacillin-tazobactam (ZOSYN) IVPB 3.375 g  Status:  Discontinued     3.375 g 12.5 mL/hr over 240 Minutes Intravenous Every 8 hours 02/21/17 0008 02/26/17 0253   02/21/17 0030  piperacillin-tazobactam (ZOSYN) IVPB 3.375 g  Status:  Discontinued     3.375 g 12.5 mL/hr over 240 Minutes Intravenous Every 8 hours 02/21/17 0021 02/21/17 0022   02/19/17 1530  ceFEPIme (MAXIPIME) 1 g in dextrose 5 % 50 mL IVPB   Status:  Discontinued     1 g 100 mL/hr over 30 Minutes Intravenous Every 8 hours 02/19/17 1525 02/21/17 0004   02/16/17 2230  ceFAZolin (ANCEF) IVPB 1 g/50 mL premix  Status:  Discontinued     1 g 100 mL/hr over 30 Minutes Intravenous Every 8 hours 02/16/17 1914 02/19/17 1524   02/16/17 1143  ceFAZolin (ANCEF) IVPB 2g/100 mL premix     2 g 200 mL/hr over 30 Minutes Intravenous On call to O.R. 02/16/17 1143 02/16/17 1429    .  Patient was given sequential compression devices, early ambulation, and aspirin for DVT prophylaxis.  Recent vital signs: Patient Vitals for the past 24 hrs:  BP Temp Temp src Pulse Resp SpO2  02/25/17 2128 120/79 98.4 F (36.9 C) Oral (!) 101 17 99 %  .  Recent laboratory studies: No results found.  Discharge Medications:   Allergies as of 02/26/2017      Reactions   Vancomycin Other (See Comments)   Acute kidney injury > Dose Related      Medication List    STOP taking these medications   oxyCODONE-acetaminophen 7.5-325 MG tablet Commonly known as:  PERCOCET Replaced by:  oxyCODONE-acetaminophen 10-325 MG tablet     TAKE these medications   acetaminophen 325 MG tablet Commonly known as:  TYLENOL Take 2 tablets (650 mg total) by mouth every 6 (six) hours as needed for mild pain or fever (or Fever >/= 101). Acetaminophen dose from all sources  not to exceed 4 g per day.   amoxicillin-clavulanate 875-125 MG tablet Commonly known as:  AUGMENTIN Take 1 tablet by mouth 2 (two) times daily. What changed:  additional instructions   blood glucose meter kit and supplies Kit Dispense based on patient and insurance preference. Use up to four times daily as directed. (FOR ICD-9 250.00, 250.01).   gabapentin 100 MG capsule Commonly known as:  NEURONTIN Take 1 capsule (100 mg total) by mouth 3 (three) times daily.   glipiZIDE 5 MG tablet Commonly known as:  GLUCOTROL Take 0.5 tablets (2.5 mg total) by mouth daily before breakfast.   glucose blood test  strip Commonly known as:  RELION GLUCOSE TEST STRIPS Use as instructed   insulin NPH Human 100 UNIT/ML injection Commonly known as:  NOVOLIN N Please inject 7 units subcutaneously the morning and 7 units in the evening. What changed:  how much to take  how to take this  when to take this  additional instructions   lisinopril 10 MG tablet Commonly known as:  PRINIVIL,ZESTRIL Take 1 tablet (10 mg total) by mouth daily.   methocarbamol 500 MG tablet Commonly known as:  ROBAXIN Take 1 tablet (500 mg total) by mouth every 6 (six) hours as needed for muscle spasms.   nicotine 21 mg/24hr patch Commonly known as:  NICODERM CQ - dosed in mg/24 hours Place 1 patch (21 mg total) onto the skin daily.   oxyCODONE-acetaminophen 10-325 MG tablet Commonly known as:  PERCOCET Take 1 tablet by mouth every 4 (four) hours as needed for pain. Replaces:  oxyCODONE-acetaminophen 7.5-325 MG tablet   Pen Needles 3/16" 31G X 5 MM Misc Use as directed 3 times daily with meals and at bedtime.   Probiotic 250 MG Caps Take 1 capsule by mouth daily.   promethazine 25 MG tablet Commonly known as:  PHENERGAN Take 25 mg by mouth every 6 (six) hours as needed for nausea or vomiting.       Diagnostic Studies: Dg Chest Port 1 View  Result Date: 02/19/2017 CLINICAL DATA:  Fevers EXAM: PORTABLE CHEST 1 VIEW COMPARISON:  08/02/2012, 02/03/2013 FINDINGS: The heart size and mediastinal contours are within normal limits. Both lungs are clear. The visualized skeletal structures are unremarkable. IMPRESSION: No active disease. Electronically Signed   By: Derrick [name] M.D.   On: 02/19/2017 14:48    Patient benefited maximally from their hospital stay and there were no complications.     Disposition: 06-Home-Health Care Svc Discharge Instructions    Call MD / Call 911    Complete by:  As directed    If you experience chest pain or shortness of breath, CALL 911 and be transported to the hospital emergency  room.  If you develope a fever above 101 F, pus (white drainage) or increased drainage or redness at the wound, or calf pain, call your surgeon's office.   Constipation Prevention    Complete by:  As directed    Drink plenty of fluids.  Prune juice may be helpful.  You may use a stool softener, such as Colace (over the counter) 100 mg twice a day.  Use MiraLax (over the counter) for constipation as needed.   Diet - low sodium heart healthy    Complete by:  As directed    Increase activity slowly as tolerated    Complete by:  As directed      Follow-up Information    Newt Minion, MD Follow up in 1 week(s).  Specialty:  Orthopedic Surgery Contact information: [address] [place] [address] [phone]            Signed: Newt Minion 02/26/2017, 6:51 AM

## 2017-02-26 NOTE — Progress Notes (Signed)
OT Cancellation Note  Patient Details Name: Derrick [name] MRN: [id number] DOB: 07/30/1971   Cancelled Treatment:    Reason Eval/Treat Not Completed: Patient declined, no reason specified. Pt agreeable to session and then declined upon therapist start of setup. Pt became argumentative in nature to simple care questions regarding pain and (A) for OOB. RN notified that patient expressed "hurting" but pt would not clearly define the need for medication despite being asked directly several times. Pt declines OT and states "I am leaving as soon as my brother gets here today."  Yetta BarreJones, Jetty PeeksJessica B   Gerri Acre, Brynn   OTR/L Pager: [phone] Office: [phone] .  02/26/2017, 8:29 AM

## 2017-02-26 NOTE — Telephone Encounter (Signed)
Emergency Hours After Line Call:  Patient's girlfriend calling with multiple concerns:  -States patient was started on three medications for anxiety during hospitalization and was not sent home with prescriptions for any of them. Discussed with Dr. Myrtie SomanWarden who managed care of patient during hospitalization and he stated those medications were Trazodone 50 mg nightly, Atarax 10 mg TID prn anxiety, and Buspar 7.5 mg BID. I have sent these prescriptions electronically.  -Wound draining. Girlfriend reports they sent him home "with a bandaid like covering" over his wound and he has already soaked the sheets. Recommended that she call ortho office to discuss and come to ER if needed.  -Antibiotics: States he was supposed to be sent home on antibiotics. I told her that D/C summary from ortho states he should take Augmentin BID and progress note from [facility]PT[facility] 3/11 documents transition to Augmentin. She states Augmentin doesn't work for Mr. Lawley. I recommended she call ortho regarding concerns for appropriateness of antibiotics.  -Insulin regimen: Ortho discharged with NPH 7 u BID. During hospitalization he had been receiving Lantus 20u daily prior to discharge. Dr. Myrtie SomanWarden notes Lantus not continued at d/c due to financial reasons. I will forward this message to PCP to address further concerns for diabetes management.   Marcy Sirenatherine Temia Debroux, D.O. 02/26/2017, 3:34 PM PGY-2, [facility]

## 2017-02-26 NOTE — Progress Notes (Signed)
D/C instructions given. Pt given opportunity to have any/all questions answered. Pt v/u and agreement with instructions and follow-up plans. Pt belongings packed in preparation for departure. Pt instructed to call when ride comes for pick up.

## 2017-02-27 ENCOUNTER — Telehealth: Payer: Self-pay | Admitting: Family Medicine

## 2017-02-27 ENCOUNTER — Encounter (HOSPITAL_COMMUNITY): Payer: Self-pay | Admitting: Emergency Medicine

## 2017-02-27 ENCOUNTER — Emergency Department (HOSPITAL_COMMUNITY)
Admission: EM | Admit: 2017-02-27 | Discharge: 2017-02-27 | Disposition: A | Discharge status: Home / Self Care | Payer: Medicare Other | Attending: Emergency Medicine | Admitting: Emergency Medicine

## 2017-02-27 DIAGNOSIS — Z794 Long term (current) use of insulin: Secondary | ICD-10-CM | POA: Diagnosis not present

## 2017-02-27 DIAGNOSIS — R5383 Other fatigue: Secondary | ICD-10-CM

## 2017-02-27 DIAGNOSIS — F17210 Nicotine dependence, cigarettes, uncomplicated: Secondary | ICD-10-CM | POA: Diagnosis not present

## 2017-02-27 DIAGNOSIS — I10 Essential (primary) hypertension: Secondary | ICD-10-CM | POA: Insufficient documentation

## 2017-02-27 DIAGNOSIS — E1140 Type 2 diabetes mellitus with diabetic neuropathy, unspecified: Secondary | ICD-10-CM | POA: Diagnosis not present

## 2017-02-27 MED ORDER — ACETAMINOPHEN 500 MG PO TABS
1000.0000 mg | ORAL_TABLET | Freq: Once | ORAL | Status: DC
  Filled 2017-02-27: qty 2

## 2017-02-27 MED ORDER — METHOCARBAMOL 1000 MG/10ML IJ SOLN
500.0000 mg | Freq: Once | INTRAVENOUS | Status: DC
  Filled 2017-02-27: qty 5

## 2017-02-27 MED ORDER — MORPHINE SULFATE (PF) 4 MG/ML IV SOLN
4.0000 mg | Freq: Once | INTRAVENOUS | Status: DC
Start: 2017-02-27 — End: 2017-02-27
  Filled 2017-02-27: qty 1

## 2017-02-27 NOTE — ED Provider Notes (Signed)
[facility] DEPT Provider Note   CSN: [id number] Arrival date & time: 02/27/17  1837     History   Chief Complaint Chief Complaint  Patient presents with  . Hip Pain    HPI KAREEN HITSMAN is a 46 y.o. male.  The history is provided by the patient.   CC: generalized fatigue  Onset/Duration: 1 day Timing: constant, unchanged Location: generalized Quality: fatigue Severity: severe Modifying Factors:  Improved by: nothing  Worsened by: nothing Associated Signs/Symptoms:  Pertinent (+): subjective fevers, chills, sweats  Pertinent (-): N/V/D, dysuria, abd pain, chest pain, SOB, discharge or drainage from the recent right leg AKA Context: pt was discharged yesterday After revision of the AKA that was complicated by abscess formation requiring IV antibiotics and incision and debridement.  Past Medical History:  Diagnosis Date  . Anemia   . Arthritis   . Diabetes mellitus    Type II  . GERD (gastroesophageal reflux disease)   . Neuropathy ([place])   . Peripheral neuropathy ([place])   . Pneumonia   . Prostatitis     Patient Active Problem List   Diagnosis Date Noted  . Postoperative infection   . History of disarticulation of right hip 02/14/2017  . Fever   . Wound infection 02/05/2017  . Post-operative pain   . Amputation stump infection ([place]) 02/04/2017  . Dehiscence of amputation stump ([place]) 01/30/2017  . Abscess of right leg   . Infection of below knee amputation stump ([place]) 01/22/2017  . Below knee amputation status, right ([place]) 01/06/2017  . Diabetic wet gangrene of the foot ([place])   . Diabetic foot infection ([place]) 12/13/2016  . HTN (hypertension) 12/13/2016  . Severe sepsis with septic shock ([place]) 12/13/2016  . Hypokalemia 12/13/2016  . Chronic osteomyelitis involving ankle and foot ([place]) 06/03/2012  . Syncope and collapse 04/12/2012  . Tobacco abuse 04/12/2012  . Cellulitis 02/25/2012  . Diabetic toe ulcer ([place]) 02/25/2012  . Type 2 diabetes mellitus  with complication, with long-term current use of insulin ([place]) 02/25/2012  . Leukocytosis 02/25/2012    Past Surgical History:  Procedure Laterality Date  . AMPUTATION  05/31/2012   Procedure: AMPUTATION DIGIT;  Surgeon: Newt Minion, MD;  Location: [facility];  Service: Orthopedics;  Laterality: Right;  right fourth and fifth toe ray amputation   . AMPUTATION Right 12/14/2016   Procedure: AMPUTATION BELOW KNEE RIGHT;  Surgeon: Newt Minion, MD;  Location: [facility];  Service: Orthopedics;  Laterality: Right;  . AMPUTATION Right 01/31/2017   Procedure: Right Above Knee Amputation;  Surgeon: Newt Minion, MD;  Location: [facility];  Service: Orthopedics;  Laterality: Right;  . APPLICATION OF WOUND VAC Right 02/05/2017   Procedure: APPLICATION OF WOUND VAC;  Surgeon: Newt Minion, MD;  Location: [facility];  Service: Orthopedics;  Laterality: Right;  . I&D EXTREMITY  08/02/2012   Procedure: IRRIGATION AND DEBRIDEMENT EXTREMITY;  Surgeon: Newt Minion, MD;  Location: [facility];  Service: Orthopedics;  Laterality: Right;  Right Foot Irrigation and Debridement, Place Antibiotic Beads  . I&D EXTREMITY Right 02/16/2017   Procedure: Debridement Right Hip Disarticulation, Placement of Infusional Wound VAC;  Surgeon: Newt Minion, MD;  Location: [facility];  Service: Orthopedics;  Laterality: Right;  . INCISION AND DRAINAGE HIP Right 02/20/2017   Procedure: IRRIGATION AND DEBRIDEMENT RIGHT HIP;  Surgeon: Newt Minion, MD;  Location: [facility];  Service: Orthopedics;  Laterality: Right;  . INCISION AND DRAINAGE HIP Right 02/23/2017   Procedure: REPEAT  IRRIGATION AND DEBRIDEMENT RIGHT HIP WITH WOUND CLOSURE AND INCISIONAL VAC PLACEMENT;  Surgeon: Newt Minion, MD;  Location: [employer];  Service: Orthopedics;  Laterality: Right;  . SHOULDER SURGERY Bilateral    rotarcuff  . STUMP REVISION Left 07/12/2016   Procedure: Revision Left Below Knee Amputation;  Surgeon: Newt Minion, MD;  Location: [facility];  Service: Orthopedics;  Laterality: Left;    . STUMP REVISION Right 01/24/2017   Procedure: Revision Right Below Knee Amputation;  Surgeon: Newt Minion, MD;  Location: [facility];  Service: Orthopedics;  Laterality: Right;  . STUMP REVISION Right 02/05/2017   Procedure: REVISION RIGHT AKA;  Surgeon: Newt Minion, MD;  Location: [facility];  Service: Orthopedics;  Laterality: Right;  . STUMP REVISION Right 02/07/2017   Procedure: RIGHT HIP DISARTICULATION;  Surgeon: Newt Minion, MD;  Location: [facility];  Service: Orthopedics;  Laterality: Right;  . TOE AMPUTATION     multiple toes amputated.       Home Medications    Prior to Admission medications   Medication Sig Start Date End Date Taking? Authorizing Provider  acetaminophen (TYLENOL) 325 MG tablet Take 2 tablets (650 mg total) by mouth every 6 (six) hours as needed for mild pain or fever (or Fever >/= 101). Acetaminophen dose from all sources not to exceed 4 g per day. Patient not taking: Reported on 02/17/2017 12/15/16   Modena Jansky, MD  amoxicillin-clavulanate (AUGMENTIN) 875-125 MG tablet Take 1 tablet by mouth 2 (two) times daily. Patient taking differently: Take 1 tablet by mouth 2 (two) times daily. 28 day course filled 02/11/17 02/11/17 03/11/17  Everrett Coombe, MD  blood glucose meter kit and supplies KIT Dispense based on patient and insurance preference. Use up to four times daily as directed. (FOR ICD-9 250.00, 250.01). 12/15/16   Modena Jansky, MD  busPIRone (BUSPAR) 7.5 MG tablet Take 1 tablet (7.5 mg total) by mouth 2 (two) times daily. 02/26/17   Nicolette Bang, DO  gabapentin (NEURONTIN) 100 MG capsule Take 1 capsule (100 mg total) by mouth 3 (three) times daily. 01/06/17   Suzan Slick, NP  glipiZIDE (GLUCOTROL) 5 MG tablet Take 0.5 tablets (2.5 mg total) by mouth daily before breakfast. 02/11/17   Everrett Coombe, MD  glucose blood (RELION GLUCOSE TEST STRIPS) test strip Use as instructed 02/14/17   Elberta Leatherwood, MD  hydrOXYzine (ATARAX/VISTARIL) 10 MG tablet Take 1  tablet (10 mg total) by mouth 3 (three) times daily as needed. 02/26/17   Nicolette Bang, DO  insulin NPH Human (NOVOLIN N) 100 UNIT/ML injection Please inject 7 units subcutaneously the morning and 7 units in the evening. Patient taking differently: Inject 7 Units into the skin 2 (two) times daily before a meal.  02/13/17 02/13/18  Everrett Coombe, MD  Insulin Pen Needle (PEN NEEDLES 3/16") 31G X 5 MM MISC Use as directed 3 times daily with meals and at bedtime. 12/15/16   Modena Jansky, MD  lisinopril (PRINIVIL,ZESTRIL) 10 MG tablet Take 1 tablet (10 mg total) by mouth daily. Patient not taking: Reported on 02/17/2017 12/16/16   Modena Jansky, MD  methocarbamol (ROBAXIN) 500 MG tablet Take 1 tablet (500 mg total) by mouth every 6 (six) hours as needed for muscle spasms. 02/11/17   Everrett Coombe, MD  nicotine (NICODERM CQ - DOSED IN MG/24 HOURS) 21 mg/24hr patch Place 1 patch (21 mg total) onto the skin daily. Patient not taking: Reported on 02/17/2017 02/12/17  Everrett Coombe, MD  oxyCODONE-acetaminophen (PERCOCET) 10-325 MG tablet Take 1 tablet by mouth every 4 (four) hours as needed for pain. 02/26/17   Newt Minion, MD  promethazine (PHENERGAN) 25 MG tablet Take 25 mg by mouth every 6 (six) hours as needed for nausea or vomiting.    Historical Provider, MD  Saccharomyces boulardii (PROBIOTIC) 250 MG CAPS Take 1 capsule by mouth daily. Patient not taking: Reported on 02/17/2017 02/11/17   Everrett Coombe, MD  traZODone (DESYREL) 50 MG tablet Take 0.5-1 tablets (25-50 mg total) by mouth at bedtime as needed for sleep. 02/26/17   Nicolette Bang, DO    Family History Family History  Problem Relation Age of Onset  . Cancer Mother   . Congestive Heart Failure Father     Social History Social History  Substance Use Topics  . Smoking status: Current Every Day Smoker    Packs/day: 2.00    Years: 24.00    Types: Cigarettes  . Smokeless tobacco: Never Used  . Alcohol use No      Allergies   Vancomycin   Review of Systems Review of Systems Ten systems are reviewed and are negative for acute change except as noted in the HPI    Physical Exam Updated Vital Signs BP 118/88   Pulse 100   Temp 97.5 F (36.4 C) (Oral)   Resp 16   SpO2 100%   Physical Exam  Constitutional: He is oriented to person, place, and time. He appears well-developed and well-nourished. No distress.  HENT:  Head: Normocephalic and atraumatic.  Nose: Nose normal.  Eyes: Conjunctivae and EOM are normal. Pupils are equal, round, and reactive to light. Right eye exhibits no discharge. Left eye exhibits no discharge. No scleral icterus.  Neck: Normal range of motion. Neck supple.  Cardiovascular: Normal rate and regular rhythm.  Exam reveals no gallop and no friction rub.   No murmur heard. Pulmonary/Chest: Effort normal and breath sounds normal. No stridor. No respiratory distress. He has no rales.  Abdominal: Soft. He exhibits no distension. There is no tenderness.  Musculoskeletal: He exhibits no edema or tenderness.       Legs: Neurological: He is alert and oriented to person, place, and time.  Skin: Skin is warm and dry. No rash noted. He is not diaphoretic. No erythema.  Psychiatric: He has a normal mood and affect.  Vitals reviewed.    ED Treatments / Results  Labs (all labs ordered are listed, but only abnormal results are displayed) Labs Reviewed - No data to display  EKG  EKG Interpretation None       Radiology No results found.  Procedures Procedures (including critical care time)  Medications Ordered in ED Medications - No data to display   Initial Impression / Assessment and Plan / ED Course  I have reviewed the triage vital signs and the nursing notes.  Pertinent labs & imaging results that were available during my care of the patient were reviewed by me and considered in my medical decision making (see chart for details).     Patient is  well-appearing, afebrile, in obvious discomfort however and toxic appearing. The patient's recent history we'll like to obtain an infectious workup. The possibilities include viral process versus narcotic withdrawal.  Patient refused blood work and eloped the assistance of his wife.   Final Clinical Impressions(s) / ED Diagnoses   Final diagnoses:  Fatigue, unspecified type     Fatima Blank, MD 02/27/17 2012

## 2017-02-27 NOTE — ED Notes (Signed)
Pt states to this RN "Unhook me so I can go." This RN responded "Are you not going to stay." PT responded nope. This RN stated she would inform Pedro. Pt stated "Tell Pedro to hurry up and get his ass in here."

## 2017-02-27 NOTE — ED Notes (Addendum)
Pt now in his room yelling "Let me out this God Damn room."

## 2017-02-27 NOTE — ED Notes (Signed)
Pt significant other rolling by the desk in wheelchair. Pt took blanket with him. Gown left at bedside. MD informed. Pt unable to be convinced to stay.

## 2017-02-27 NOTE — ED Triage Notes (Signed)
Per [facility] ems, pt from home with R AKA that was done up to the hip last week. Pt hx of BKA on L leg. Hx diabetic. Pt left the hospital yesterday after admission, states today he feels weak and tired. Pt c/o R hip pain.

## 2017-02-28 ENCOUNTER — Telehealth (INDEPENDENT_AMBULATORY_CARE_PROVIDER_SITE_OTHER): Payer: Self-pay | Admitting: Radiology

## 2017-02-28 NOTE — Telephone Encounter (Signed)
[name]--Nurse case Production designer, theatre/television/filmmanager for Leona CarryBobby [employer]um[employer] called left message on triage voice mail stating that patient was discharged from hospital on Monday, Derrick MessierKathy went and saw patient on Tuesday and patient had not been up, moving, eating, drinking, or taking meds. She states he was sweating, his vitals were good his incision looked good. States that when his girlfriend got home at 5pm yesterday he had her take [facility] to be evaluated and left due to something about being stuck, then [facility], he was discharged from there because they could not find anything wrong with him.  Today, Derrick MessierKathy states that she went out to evaluate Mr. Koehl and he appears to be acutely ill, his blood sugar is 321, HR 98, his vomitting a dark brown color.  Derrick MessierKathy wanted to know what they should do.  I asked Denny Peonrin to listen to message, per Denny Peonrin patient needs to go to Emergency Department.  I tried to call Derrick MessierKathy back to advise and only got VM and left message advised to have patient to go to Emergency department. I also tried to call patient but there was no answer and no voice mail has been set up.

## 2017-02-28 NOTE — Telephone Encounter (Signed)
Olegario MessierKathy from [facility] called back and said she got my voicemail and said she called and spoke with the patients girlfriend and told them to go to the ER but patient is refusing to do so. He states that he is going to stop smoking and see if that helps.  I advised Denny Peonrin of this.

## 2017-03-12 ENCOUNTER — Ambulatory Visit (INDEPENDENT_AMBULATORY_CARE_PROVIDER_SITE_OTHER): Payer: Medicare Other | Admitting: Orthopedic Surgery

## 2017-03-12 DIAGNOSIS — Z89621 Acquired absence of right hip joint: Secondary | ICD-10-CM

## 2017-03-12 MED ORDER — OXYCODONE-ACETAMINOPHEN 10-325 MG PO TABS: 1.0000 | ORAL_TABLET | Freq: Three times a day (TID) | ORAL | 0 refills | Status: DC | PRN

## 2017-03-12 NOTE — Progress Notes (Addendum)
Office Visit Note   Patient: Derrick [name]           Date of Birth: 1971/06/03           MRN: [id number] Visit Date: 03/12/2017              Requested by: Kathee Delton, MD [address] [place], [place] [address] PCP: Mickie Hillier, MD  No chief complaint on file.   HPI: Patient status post revision right hip disarticulation. He complains a little bit of pain over the ischial tuberosity.  Assessment & Plan: Visit Diagnoses:  1. History of disarticulation of right hip     Plan: Recommended patient use his eggcrate padding to unload the ischial tuberosity to prevent ulceration he is to follow up with [facility] for a stump shrinker  Follow-Up Instructions: Return in about 2 weeks (around 03/26/2017).   Ortho Exam  Patient is alert, oriented, no adenopathy, well-dressed, normal affect, normal respiratory effort. Patient's incision is well-healed the sutures are harvested today there is no redness no cellulitis no signs of infection. He is developing a little bit of redness over the ischial tuberosity and recommended using his eggcrate padding to unload pressure from this area.  Imaging: No results found.  Labs: Lab Results  Component Value Date   HGBA1C 14.5 (H) 12/13/2016   HGBA1C 8.8 (H) 05/31/2012   HGBA1C 9.3 (H) 04/12/2012   ESRSEDRATE 30 (H) 04/20/2010   ESRSEDRATE 37 (H) 04/18/2010   ESRSEDRATE 14 08/12/2009   CRP 6.2 (H) 04/18/2010   REPTSTATUS 02/24/2017 FINAL 02/19/2017   GRAMSTAIN  02/16/2017    ABUNDANT WBC PRESENT, PREDOMINANTLY MONONUCLEAR ABUNDANT GRAM POSITIVE COCCI IN CLUSTERS IN PAIRS ABUNDANT GRAM NEGATIVE RODS    CULT NO GROWTH 5 DAYS 02/19/2017   LABORGA GROUP B STREP(S.AGALACTIAE)ISOLATED 01/31/2017    Orders:  No orders of the defined types were placed in this encounter.  Meds ordered this encounter  Medications  . oxyCODONE-acetaminophen (PERCOCET) 10-325 MG tablet    Sig: Take 1 tablet by mouth every 8 (eight) hours as needed for pain.      Dispense:  40 tablet    Refill:  0     Procedures: No procedures performed  Clinical Data: No additional findings.  ROS: Review of Systems  Objective: Vital Signs: There were no vitals taken for this visit.  Specialty Comments:  No specialty comments available.  PMFS History: Patient Active Problem List   Diagnosis Date Noted  . Postoperative infection   . History of disarticulation of right hip 02/14/2017  . Fever   . Wound infection 02/05/2017  . Post-operative pain   . Amputation stump infection (HCC) 02/04/2017  . Dehiscence of amputation stump (HCC) 01/30/2017  . Abscess of right leg   . 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 Below knee amputation status, right (H[facility]) 01/06/2017  . Diabetic wet gangrene of the foot (H[facility])   . Diabetic foot infection (H[facility]) 12/13/2016  . HTN (hypertension) 12/13/2016  . Severe sepsis with septic shock (H[facility]) 12/13/2016  . Hypokalemia 12/13/2016  . Chronic osteomyelitis involving ankle and foot (H[facility]) 06/03/2012  . Syncope and collapse 04/12/2012  . Tobacco abuse 04/12/2012  . Cellulitis 02/25/2012  . Diabetic toe ulcer (H[facility]) 02/25/2012  . Type 2 diabetes mellitus with complication, with long-term current use of insulin (H[facility]) 02/25/2012  . Leukocytosis 02/25/2012   Past Medical History:  Diagnosis Date  . Anemia   . Arthritis   . Diabetes mellitus  Type II  . GERD (gastroesophageal reflux disease)   . Neuropathy (H[facility])   . Peripheral neuropathy ([facility])   . Pneumonia   . Prostatitis     Family History  Problem Relation Age of Onset  . Cancer Mother   . Congestive Heart Failure Father     Past Surgical History:  Procedure Laterality Date  . AMPUTATION  05/31/2012   Procedure: AMPUTATION DIGIT;  Surgeon: Nadara MustardMarcus V Duda, MD;  [facility];  Service: Orthopedics;  Laterality: Right;  right fourth and fifth toe ray amputation   . AMPUTATION Right 12/14/2016   Procedure: AMPUTATION BELOW KNEE RIGHT;   Surgeon: Nadara MustardMarcus Duda V, MD;  [facility];  Service: Orthopedics;  Laterality: Right;  . AMPUTATION Right 01/31/2017   Procedure: Right Above Knee Amputation;  Surgeon: Nadara MustardMarcus Duda V, MD;  [facility];  Service: Orthopedics;  Laterality: Right;  . APPLICATION OF WOUND VAC Right 02/05/2017   Procedure: APPLICATION OF WOUND VAC;  Surgeon: Nadara MustardMarcus Duda V, MD;  [facility];  Service: Orthopedics;  Laterality: Right;  . I&D EXTREMITY  08/02/2012   Procedure: IRRIGATION AND DEBRIDEMENT EXTREMITY;  Surgeon: Nadara MustardMarcus V Duda, MD;  [facility];  Service: Orthopedics;  Laterality: Right;  Right Foot Irrigation and Debridement, Place Antibiotic Beads  . I&D EXTREMITY Right 02/16/2017   Procedure: Debridement Right Hip Disarticulation, Placement of Infusional Wound VAC;  Surgeon: Nadara MustardMarcus Duda V, MD;  [facility];  Service: Orthopedics;  Laterality: Right;  . INCISION AND DRAINAGE HIP Right 02/20/2017   Procedure: IRRIGATION AND DEBRIDEMENT RIGHT HIP;  Surgeon: Nadara MustardMarcus Duda V, MD;  [facility];  Service: Orthopedics;  Laterality: Right;  . INCISION AND DRAINAGE HIP Right 02/23/2017   Procedure: REPEAT IRRIGATION AND DEBRIDEMENT RIGHT HIP WITH WOUND CLOSURE AND INCISIONAL VAC PLACEMENT;  Surgeon: Nadara MustardMarcus Duda V, MD;  [facility];  Service: Orthopedics;  Laterality: Right;  . SHOULDER SURGERY Bilateral    rotarcuff  . STUMP REVISION Left 07/12/2016   Procedure: Revision Left Below Knee Amputation;  Surgeon: Nadara MustardMarcus V Duda, MD;  [facility];  Service: Orthopedics;  Laterality: Left;  . STUMP REVISION Right 01/24/2017   Procedure: Revision Right Below Knee Amputation;  Surgeon: Nadara MustardMarcus Duda V, MD;  [facility];  Service: Orthopedics;  Laterality: Right;  . STUMP REVISION Right 02/05/2017   Procedure: REVISION RIGHT AKA;  Surgeon: Nadara MustardMarcus Duda V, MD;  [facility];  Service: Orthopedics;  Laterality: Right;  . STUMP REVISION Right 02/07/2017   Procedure: RIGHT HIP DISARTICULATION;  Surgeon: Nadara MustardMarcus  Duda V, MD;  [facility];  Service: Orthopedics;  Laterality: Right;  . TOE AMPUTATION     multiple toes amputated.   Social History   Occupational History  . Not on file.   Social History Main Topics  . Smoking status: Current Every Day Smoker    Packs/day: 2.00    Years: 24.00    Types: Cigarettes  . Smokeless tobacco: Never Used  . Alcohol use No  . Drug use: No  . Sexual activity: Yes    Birth control/ protection: None

## 2017-03-12 NOTE — Addendum Note (Signed)
Addended by: Aldean BakerUDA, Torry Adamczak on: 03/12/2017 04:11 PM   Modules accepted: Orders

## 2017-03-19 ENCOUNTER — Inpatient Hospital Stay: Payer: Medicare Other | Admitting: Family Medicine

## 2017-04-02 ENCOUNTER — Telehealth (INDEPENDENT_AMBULATORY_CARE_PROVIDER_SITE_OTHER): Payer: Self-pay | Admitting: Orthopedic Surgery

## 2017-04-02 NOTE — Telephone Encounter (Signed)
Received call from [facility] stating patient need a Rx for his prosthetic. She advised need to fax Rx to [facility] Attn: Thayer Ohm. The number to contact [name] is [phone] or [place] at [phone]

## 2017-05-18 NOTE — Addendum Note (Signed)
Addendum  created 05/18/17 1146 by Alayah Knouff, MD   Sign clinical note    

## 2017-05-18 NOTE — Addendum Note (Signed)
Addendum  created 05/18/17 1044 by Brison Fiumara, MD   Sign clinical note    

## 2017-05-21 ENCOUNTER — Telehealth (INDEPENDENT_AMBULATORY_CARE_PROVIDER_SITE_OTHER): Payer: Self-pay | Admitting: Orthopedic Surgery

## 2017-05-21 NOTE — Addendum Note (Signed)
Addendum  created 05/21/17 1211 by Toretto Tingler, MD   Sign clinical note    

## 2017-05-21 NOTE — Addendum Note (Signed)
Addendum  created 05/21/17 1123 by Adra Shepler, MD   Sign clinical note    

## 2017-05-21 NOTE — Telephone Encounter (Signed)
Patient's (finance) Derrick [name] called needing a RX for ( PT)  for patient's right leg. The fax# to [facility] is [phone]. The number to contact [name] is [phone]

## 2017-05-21 NOTE — Addendum Note (Signed)
Addendum  created 05/21/17 1211 by Val EagleMoser, Yidel Teuscher, MD   Sign clinical note

## 2017-05-22 ENCOUNTER — Other Ambulatory Visit (INDEPENDENT_AMBULATORY_CARE_PROVIDER_SITE_OTHER): Payer: Self-pay

## 2017-05-22 NOTE — Telephone Encounter (Signed)
Order faxed as requested to [facility]

## 2017-08-09 NOTE — Addendum Note (Signed)
Addendum  created 08/09/17 1122 by Brandalyn Harting, MD   Sign clinical note    

## 2017-08-16 ENCOUNTER — Ambulatory Visit (INDEPENDENT_AMBULATORY_CARE_PROVIDER_SITE_OTHER): Payer: Medicare Other | Admitting: Orthopedic Surgery

## 2017-08-16 ENCOUNTER — Encounter (INDEPENDENT_AMBULATORY_CARE_PROVIDER_SITE_OTHER): Payer: Self-pay | Admitting: Orthopedic Surgery

## 2017-08-16 DIAGNOSIS — E13622 Other specified diabetes mellitus with other skin ulcer: Secondary | ICD-10-CM | POA: Diagnosis not present

## 2017-08-16 DIAGNOSIS — L97909 Non-pressure chronic ulcer of unspecified part of unspecified lower leg with unspecified severity: Secondary | ICD-10-CM | POA: Diagnosis not present

## 2017-08-16 MED ORDER — MUPIROCIN 2 % EX OINT: 1.0000 "application " | TOPICAL_OINTMENT | Freq: Two times a day (BID) | CUTANEOUS | 3 refills | Status: AC

## 2017-08-16 NOTE — Progress Notes (Signed)
Office Visit Note   Patient: Derrick RoseBobby L Callens           Date of Birth: 02/20/1971           MRN: [id number] Visit Date: 08/16/2017              Requested by: Arvilla MarketWallace, Catherine Lauren, DO [address] [place], [place] [id number] PCP: Patient, [name]  Chief Complaint  Patient presents with  . Left Knee - Pain  . 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 Patient states he is also recently had a baseball game when he had his leg on for a prolonged period of time and developed an ulcer over the patella.  Assessment & Plan: Visit Diagnoses:  1. 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  Follow-Up Instructions: Return if symptoms worsen or fail to improve.   Ortho Exam  Patient is alert, oriented, no adenopathy, well-dressed, normal affect, normal respiratory effort. Examination the right hip disarticulation is well-healed there is no fluctuance no cellulitis no drainage no odor no signs of infection. Examination of the left knee he has a 3 cm in diameter scab over the patella there is no cellulitis no drainage no odor there is good granulation tissue around the edges.  Imaging: No results found. No images are attached to the encounter.  Labs: Lab Results  Component Value Date   HGBA1C 14.5 (H) 12/13/2016   HGBA1C 8.8 (H) 05/31/2012   HGBA1C 9.3 (H) 04/12/2012   ESRSEDRATE 30 (H) 04/20/2010   ESRSEDRATE 37 (H) 04/18/2010   ESRSEDRATE 14 08/12/2009   CRP 6.2 (H) 04/18/2010   REPTSTATUS 02/24/2017 FINAL 02/19/2017   GRAMSTAIN  02/16/2017    ABUNDANT WBC PRESENT, PREDOMINANTLY  MONONUCLEAR ABUNDANT GRAM POSITIVE COCCI IN CLUSTERS IN PAIRS ABUNDANT GRAM NEGATIVE RODS    CULT NO GROWTH 5 DAYS 02/19/2017   LABORGA GROUP B STREP(S.AGALACTIAE)ISOLATED 01/31/2017    Orders:  No orders of the defined types were placed in this encounter.  No orders of the defined types were placed in this encounter.    Procedures: No procedures performed  Clinical Data: No additional findings.  ROS:  All other systems negative, except as noted in the HPI. Review of Systems  Objective: Vital Signs: There were no vitals taken for this visit.  Specialty Comments:  No specialty comments available.  PMFS History: Patient Active Problem List   Diagnosis Date Noted  . Postoperative infection   . History of disarticulation of right hip 02/14/2017  . Fever   . Wound infection 02/05/2017  . Post-operative pain   . Amputation stump infection (HCC) 02/04/2017  . Dehiscence of amputation stump (HCC) 01/30/2017  . Abscess of right leg   . 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 Below knee amputation status, right (HCC) 01/06/2017  . Diabetic wet gangrene of the foot (HCC)   . Diabetic foot infection (HCC) 12/13/2016  . HTN (hypertension) 12/13/2016  . Severe sepsis with septic shock (HCC) 12/13/2016  . Hypokalemia 12/13/2016  . Chronic osteomyelitis involving ankle  and foot (HCC) 06/03/2012  . Syncope and collapse 04/12/2012  . Tobacco abuse 04/12/2012  . Cellulitis 02/25/2012  . Diabetic toe ulcer (HCC) 02/25/2012  . Type 2 diabetes mellitus with complication, with long-term current use of insulin (HCC) 02/25/2012  . Leukocytosis 02/25/2012   Past Medical History:  Diagnosis Date  . Anemia   . Arthritis   . Diabetes mellitus    Type II  . GERD (gastroesophageal reflux disease)   . Neuropathy   . Peripheral neuropathy   . Pneumonia   . Prostatitis     Family History  Problem Relation Age of Onset  . Cancer Mother   . Congestive Heart  Failure Father     Past Surgical History:  Procedure Laterality Date  . AMPUTATION  05/31/2012   Procedure: AMPUTATION DIGIT;  Surgeon: Nadara Mustard, MD;  Location: [facility] OR;  Service: Orthopedics;  Laterality: Right;  right fourth and fifth toe ray amputation   . AMPUTATION Right 12/14/2016   Procedure: AMPUTATION BELOW KNEE RIGHT;  Surgeon: Nadara Mustard, MD;  [facility] OR;  Service: Orthopedics;  Laterality: Right;  . AMPUTATION Right 01/31/2017   Procedure: Right Above Knee Amputation;  Surgeon: Nadara Mustard, MD;  Location: [facility] OR;  Service: Orthopedics;  Laterality: Right;  . APPLICATION OF WOUND VAC Right 02/05/2017   Procedure: APPLICATION OF WOUND VAC;  Surgeon: Nadara Mustard, MD;  [facility]: [facility] OR;  Service: Orthopedics;  Laterality: Right;  . I&D EXTREMITY  08/02/2012   Procedure: IRRIGATION AND DEBRIDEMENT EXTREMITY;  Surgeon: Nadara Mustard, MD;  [facility] OR;  Service: Orthopedics;  Laterality: Right;  Right Foot Irrigation and Debridement, Place Antibiotic Beads  . I&D EXTREMITY Right 02/16/2017   Procedure: Debridement Right Hip Disarticulation, Placement of Infusional Wound VAC;  Surgeon: Nadara Mustard, MD;  [facility] OR;  Service: Orthopedics;  Laterality: Right;  . INCISION AND DRAINAGE HIP Right 02/20/2017   Procedure: IRRIGATION AND DEBRIDEMENT RIGHT HIP;  Surgeon: Nadara Mustard, MD;  [facility] OR;  Service: Orthopedics;  Laterality: Right;  . INCISION AND DRAINAGE HIP Right 02/23/2017   Procedure: REPEAT IRRIGATION AND DEBRIDEMENT RIGHT HIP WITH WOUND CLOSURE AND INCISIONAL VAC PLACEMENT;  Surgeon: Nadara Mustard, MD;  Location: [facility] OR;  Service: Orthopedics;  Laterality: Right;  . SHOULDER SURGERY Bilateral    rotarcuff  . STUMP REVISION Left 07/12/2016   Procedure: Revision Left Below Knee Amputation;  Surgeon: Nadara Mustard, MD;  Location: [facility] OR;  Service: Orthopedics;  Laterality: Left;  . STUMP REVISION Right 01/24/2017   Procedure: Revision Right Below Knee Amputation;   Surgeon: Nadara Mustard, MD;  Location: [facility] OR;  Service: Orthopedics;  Laterality: Right;  . STUMP REVISION Right 02/05/2017   Procedure: REVISION RIGHT AKA;  Surgeon: Nadara Mustard, MD;  Location: [facility] OR;  Service: Orthopedics;  Laterality: Right;  . STUMP REVISION Right 02/07/2017   Procedure: RIGHT HIP DISARTICULATION;  Surgeon: Nadara Mustard, MD;  [facility] OR;  Service: Orthopedics;  Laterality: Right;  . TOE AMPUTATION     multiple toes amputated.   Social History   Occupational History  . Not on file.   Social History Main Topics  . Smoking status: Current Every Day Smoker    Packs/day: 2.00    Years: 24.00    Types: Cigarettes  . Smokeless tobacco: Never Used  . Alcohol use No  . Drug use: No  . Sexual activity: Yes    Birth control/  protection: None

## 2017-09-05 ENCOUNTER — Telehealth (INDEPENDENT_AMBULATORY_CARE_PROVIDER_SITE_OTHER): Payer: Self-pay | Admitting: Orthopedic Surgery

## 2017-09-05 NOTE — Telephone Encounter (Signed)
Patient's fiance called stating that the patient's prescription for physical therapy runs out today and they are needing a new one sent to [facility] #[phone].  Thank you.

## 2017-09-05 NOTE — Telephone Encounter (Signed)
Faxed today at 4:38pm to [phone], [name] made patients fiance aware that it would be done before end of day today.

## 2018-02-07 ENCOUNTER — Encounter (INDEPENDENT_AMBULATORY_CARE_PROVIDER_SITE_OTHER): Payer: Self-pay | Admitting: Orthopedic Surgery

## 2018-02-07 ENCOUNTER — Ambulatory Visit (INDEPENDENT_AMBULATORY_CARE_PROVIDER_SITE_OTHER): Payer: Medicare Other | Admitting: Orthopedic Surgery

## 2018-02-07 VITALS — Ht 73.0 in | Wt 200.0 lb

## 2018-02-07 DIAGNOSIS — Z89621 Acquired absence of right hip joint: Secondary | ICD-10-CM

## 2018-02-07 DIAGNOSIS — Z89512 Acquired absence of left leg below knee: Secondary | ICD-10-CM | POA: Insufficient documentation

## 2018-02-07 DIAGNOSIS — R2241 Localized swelling, mass and lump, right lower limb: Secondary | ICD-10-CM | POA: Diagnosis not present

## 2018-02-07 NOTE — Progress Notes (Signed)
Office Visit Note   Patient: Derrick [name]           Date of Birth: 02/08/1971           MRN: [id number] Visit Date: 02/07/2018              Requested by: No referring provider defined for this encounter. PCP: Patient, [name]  Chief Complaint  Patient presents with  . Right Hip - Pain    Hx right hip disarticulation       HPI: Patient is a 47 year old gentleman who presents complaining of painful mass anterior aspect of the right hip.  He is status post right hip disarticulation.  He states the mass changes in size and is occasionally red and warm.  Denies any drainage denies any cellulitis.  Patient is also status post a left transtibial amputation who is been having decreasing volume of the residual limb and has instability with ambulation with his prosthesis.  Assessment & Plan: Visit Diagnoses:  1. Mass of right hip region   2. History of disarticulation of right hip   3. 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  Patient is given a prescription for [facility] orthotics to provide new liner for his prosthesis on the left he may need a new socket or socket modification.  Follow-up after the MRI scan of the right hip  Follow-Up Instructions: Return in about 2 weeks (around 02/21/2018).   Ortho Exam  Patient is alert, oriented, no adenopathy, well-dressed, normal affect, normal respiratory effort. Examination patient ambulates in a wheelchair.  He has no rotational stability with a prosthesis on the left the limb has lost residual volume and the socket is too large for his leg.  Patient has full active extension of his knee there is no skin or wound breakdowns.  Examination of the right hip patient has no redness no cellulitis no drainage.  He does have a palpable soft tissue mass anteriorly to the right hip.  With coughing patient has no  prominence.  Imaging: No results found. No images are attached to the encounter.  Labs: Lab Results  Component Value Date   HGBA1C 14.5 (H) 12/13/2016   HGBA1C 8.8 (H) 05/31/2012   HGBA1C 9.3 (H) 04/12/2012   ESRSEDRATE 30 (H) 04/20/2010   ESRSEDRATE 37 (H) 04/18/2010   ESRSEDRATE 14 08/12/2009   CRP 6.2 (H) 04/18/2010   REPTSTATUS 02/24/2017 FINAL 02/19/2017   GRAMSTAIN  02/16/2017    ABUNDANT WBC PRESENT, PREDOMINANTLY MONONUCLEAR ABUNDANT GRAM POSITIVE COCCI IN CLUSTERS IN PAIRS ABUNDANT GRAM NEGATIVE RODS    CULT NO GROWTH 5 DAYS 02/19/2017   LABORGA GROUP B STREP(S.AGALACTIAE)ISOLATED 01/31/2017    @LABSALLVALUES (HGBA1)@  Body mass index is 26.39 kg/m.  Orders:  Orders Placed This Encounter  Procedures  . MR Hip Right w/ contrast   No orders of the defined types were placed in this encounter.    Procedures: No procedures performed  Clinical Data: No additional findings.  ROS:  All other systems negative, except as noted in the HPI. Review of Systems  Objective: Vital Signs: Ht 6\' 1"  (1.854 m)   Wt 200 lb (90.7 kg)   BMI 26.39 kg/m   Specialty Comments:  No specialty comments available.  PMFS History: Patient Active Problem List   Diagnosis Date Noted  . Hx of  amputation of leg through tibia and fibula, left (HCC) 02/07/2018  . Postoperative infection   . History of disarticulation of right hip 02/14/2017  . Fever   . Wound infection 02/05/2017  . Post-operative pain   . Amputation stump infection (HCC) 02/04/2017  . Dehiscence of amputation stump (HCC) 01/30/2017  . Abscess of right leg   . 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 Below knee amputation status, right (HCC) 01/06/2017  . Diabetic wet gangrene of the foot (HCC)   . Diabetic foot infection (HCC) 12/13/2016  . HTN (hypertension) 12/13/2016  . Severe sepsis with septic shock (HCC) 12/13/2016  . Hypokalemia 12/13/2016  . Chronic osteomyelitis involving  ankle and foot (HCC) 06/03/2012  . Syncope and collapse 04/12/2012  . Tobacco abuse 04/12/2012  . Cellulitis 02/25/2012  . Diabetic toe ulcer (HCC) 02/25/2012  . Type 2 diabetes mellitus with complication, with long-term current use of insulin (HCC) 02/25/2012  . Leukocytosis 02/25/2012   Past Medical History:  Diagnosis Date  . Anemia   . Arthritis   . Diabetes mellitus    Type II  . GERD (gastroesophageal reflux disease)   . Neuropathy   . Peripheral neuropathy   . Pneumonia   . Prostatitis     Family History  Problem Relation Age of Onset  . Cancer Mother   . Congestive Heart Failure Father     Past Surgical History:  Procedure Laterality Date  . AMPUTATION  05/31/2012   Procedure: AMPUTATION DIGIT;  Surgeon: Nadara Mustard, MD;  Location: [facility] OR;  Service: Orthopedics;  Laterality: Right;  right fourth and fifth toe ray amputation   . AMPUTATION Right 12/14/2016   Procedure: AMPUTATION BELOW KNEE RIGHT;  Surgeon: Nadara Mustard, MD;  [facility]: [facility] OR;  Service: Orthopedics;  Laterality: Right;  . AMPUTATION Right 01/31/2017   Procedure: Right Above Knee Amputation;  Surgeon: Nadara Mustard, MD;  Location: [facility] OR;  Service: Orthopedics;  Laterality: Right;  . APPLICATION OF WOUND VAC Right 02/05/2017   Procedure: APPLICATION OF WOUND VAC;  Surgeon: Nadara Mustard, MD;  [facility]: [facility] OR;  Service: Orthopedics;  Laterality: Right;  . I&D EXTREMITY  08/02/2012   Procedure: IRRIGATION AND DEBRIDEMENT EXTREMITY;  Surgeon: Nadara Mustard, MD;  Location: [facility] OR;  Service: Orthopedics;  Laterality: Right;  Right Foot Irrigation and Debridement, Place Antibiotic Beads  . I&D EXTREMITY Right 02/16/2017   Procedure: Debridement Right Hip Disarticulation, Placement of Infusional Wound VAC;  Surgeon: Nadara Mustard, MD;  Location: [facility] OR;  Service: Orthopedics;  Laterality: Right;  . INCISION AND DRAINAGE HIP Right 02/20/2017   Procedure: IRRIGATION AND DEBRIDEMENT RIGHT HIP;  Surgeon: Nadara Mustard, MD;   Location: [facility] OR;  Service: Orthopedics;  Laterality: Right;  . INCISION AND DRAINAGE HIP Right 02/23/2017   Procedure: REPEAT IRRIGATION AND DEBRIDEMENT RIGHT HIP WITH WOUND CLOSURE AND INCISIONAL VAC PLACEMENT;  Surgeon: Nadara Mustard, MD;  Location: [facility] OR;  Service: Orthopedics;  Laterality: Right;  . SHOULDER SURGERY Bilateral    rotarcuff  . STUMP REVISION Left 07/12/2016   Procedure: Revision Left Below Knee Amputation;  Surgeon: Nadara Mustard, MD;  Location: [facility] OR;  Service: Orthopedics;  Laterality: Left;  . STUMP REVISION Right 01/24/2017   Procedure: Revision Right Below Knee Amputation;  Surgeon: Nadara Mustard, MD;  Location: [facility] OR;  Service: Orthopedics;  Laterality: Right;  . STUMP REVISION Right 02/05/2017   Procedure: REVISION RIGHT AKA;  Surgeon: Berna Spare  Kandis Mannan, MD;  Location: [facility] OR;  Service: Orthopedics;  Laterality: Right;  . STUMP REVISION Right 02/07/2017   Procedure: RIGHT HIP DISARTICULATION;  Surgeon: Nadara Mustard, MD;  Location: [facility] OR;  Service: Orthopedics;  Laterality: Right;  . TOE AMPUTATION     multiple toes amputated.   Social History   Occupational History  . Not on file  Tobacco Use  . Smoking status: Current Every Day Smoker    Packs/day: 2.00    Years: 24.00    Pack years: 48.00    Types: Cigarettes  . Smokeless tobacco: Never Used  Substance and Sexual Activity  . Alcohol use: No  . Drug use: No  . Sexual activity: Yes    Birth control/protection: None

## 2018-02-12 ENCOUNTER — Ambulatory Visit
Admission: RE | Admit: 2018-02-12 | Discharge: 2018-02-12 | Disposition: A | Discharge status: Home / Self Care | Payer: Medicare Other | Source: Ambulatory Visit | Attending: Orthopedic Surgery | Admitting: Orthopedic Surgery

## 2018-02-12 DIAGNOSIS — R2241 Localized swelling, mass and lump, right lower limb: Secondary | ICD-10-CM

## 2018-02-12 MED ORDER — GADOBENATE DIMEGLUMINE 529 MG/ML IV SOLN
10.0000 mL | Freq: Once | INTRAVENOUS | Status: AC | PRN
  Administered 2018-02-12: 10 mL via INTRAVENOUS

## 2018-02-18 ENCOUNTER — Encounter (INDEPENDENT_AMBULATORY_CARE_PROVIDER_SITE_OTHER): Payer: Self-pay | Admitting: Orthopedic Surgery

## 2018-02-18 ENCOUNTER — Ambulatory Visit (INDEPENDENT_AMBULATORY_CARE_PROVIDER_SITE_OTHER): Payer: Medicare Other | Admitting: Orthopedic Surgery

## 2018-02-18 VITALS — Ht 73.0 in | Wt 200.0 lb

## 2018-02-18 DIAGNOSIS — Z89621 Acquired absence of right hip joint: Secondary | ICD-10-CM

## 2018-02-18 DIAGNOSIS — Z89512 Acquired absence of left leg below knee: Secondary | ICD-10-CM | POA: Diagnosis not present

## 2018-02-18 MED ORDER — DOXYCYCLINE HYCLATE 100 MG PO TABS: 100.0000 mg | ORAL_TABLET | Freq: Two times a day (BID) | ORAL | 0 refills | Status: AC

## 2018-02-18 MED ORDER — OXYCODONE-ACETAMINOPHEN 10-325 MG PO TABS: 1.0000 | ORAL_TABLET | Freq: Three times a day (TID) | ORAL | 0 refills | Status: DC | PRN

## 2018-02-18 NOTE — Progress Notes (Signed)
Office Visit Note   Patient: Derrick RoseBobby L Manukyan           Date of Birth: 03/03/1971           MRN: [id number] Visit Date: 02/18/2018              Requested by: No referring provider defined for this encounter. PCP: Patient, [name]  Chief Complaint  Patient presents with  . Right Hip - Follow-up    MRI review      HPI: Patient is a 47 year old gentleman who presents in follow-up for bilateral lower extremities he has a loose fitting socket for the left transtibial amputation and has been having some pain and swelling anteriorly on the right hip disarticulation.  Assessment & Plan: Visit Diagnoses:  1. History of disarticulation of right hip   2. 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  Patient is asymptomatic at this location but we will place him on a course of doxycycline to see if this helps.  The area of the swelling shows no evidence of an abscess.  He is given a refill prescription for his Percocet.  Follow-up in 4 weeks.  Patient was given a prescription for [facility] for a new socket for the right hip disarticulation and a prescription for a new socket for the left transtibial amputation.  Follow-Up Instructions: No Follow-up on file.   Ortho Exam  Patient is alert, oriented, no adenopathy, well-dressed, normal affect, normal respiratory effort. Examination there is no redness no cellulitis no drainage from his hip.  His loose fitting prosthesis and has no rotational instability on the left.  Review of the MRI scan shows inferior pubic rami increased enhancement which is possibly consistent with osteomyelitis however there is no fluid collection no abscess.  Discussed with the patient we cannot resect his inferior pubic rami.  Imaging: No results found. No images are attached to the encounter.  Labs: Lab Results  Component Value Date   HGBA1C 14.5 (H)  12/13/2016   HGBA1C 8.8 (H) 05/31/2012   HGBA1C 9.3 (H) 04/12/2012   ESRSEDRATE 30 (H) 04/20/2010   ESRSEDRATE 37 (H) 04/18/2010   ESRSEDRATE 14 08/12/2009   CRP 6.2 (H) 04/18/2010   REPTSTATUS 02/24/2017 FINAL 02/19/2017   GRAMSTAIN  02/16/2017    ABUNDANT WBC PRESENT, PREDOMINANTLY MONONUCLEAR ABUNDANT GRAM POSITIVE COCCI IN CLUSTERS IN PAIRS ABUNDANT GRAM NEGATIVE RODS    CULT NO GROWTH 5 DAYS 02/19/2017   LABORGA GROUP B STREP(S.AGALACTIAE)ISOLATED 01/31/2017    @LABSALLVALUES (HGBA1)@  Body mass index is 26.39 kg/m.  Orders:  No orders of the defined types were placed in this encounter.  Meds ordered this encounter  Medications  . oxyCODONE-acetaminophen (PERCOCET) 10-325 MG tablet    Sig: Take 1 tablet by mouth every 8 (eight) hours as needed for pain.    Dispense:  21 tablet    Refill:  0  . doxycycline (VIBRA-TABS) 100 MG tablet    Sig: Take 1 tablet (100 mg total) by mouth 2 (two) times daily.    Dispense:  60 tablet    Refill:  0     Procedures: No procedures performed  Clinical Data: No additional findings.  ROS:  All other systems negative, except as noted in the HPI. Review of Systems  Objective: Vital Signs: Ht 6\' 1"  (1.854 m)   Wt 200 lb (90.7  kg)   BMI 26.39 kg/m   Specialty Comments:  No specialty comments available.  PMFS History: Patient Active Problem List   Diagnosis Date Noted  . 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 Postoperative infection   . History of disarticulation of right hip 02/14/2017  . Fever   . Wound infection 02/05/2017  . Post-operative pain   . Amputation stump infection (HCC) 02/04/2017  . Dehiscence of amputation stump (HCC) 01/30/2017  . Abscess of right leg   . 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 Below knee amputation status, right (HCC) 01/06/2017  . Diabetic wet gangrene of the foot (HCC)   . Diabetic foot infection (HCC) 12/13/2016  . HTN  (hypertension) 12/13/2016  . Severe sepsis with septic shock (HCC) 12/13/2016  . Hypokalemia 12/13/2016  . Chronic osteomyelitis involving ankle and foot (HCC) 06/03/2012  . Syncope and collapse 04/12/2012  . Tobacco abuse 04/12/2012  . Cellulitis 02/25/2012  . Diabetic toe ulcer (HCC) 02/25/2012  . Type 2 diabetes mellitus with complication, with long-term current use of insulin (HCC) 02/25/2012  . Leukocytosis 02/25/2012   Past Medical History:  Diagnosis Date  . Anemia   . Arthritis   . Diabetes mellitus    Type II  . GERD (gastroesophageal reflux disease)   . Neuropathy   . Peripheral neuropathy   . Pneumonia   . Prostatitis     Family History  Problem Relation Age of Onset  . Cancer Mother   . Congestive Heart Failure Father     Past Surgical History:  Procedure Laterality Date  . AMPUTATION  05/31/2012   Procedure: AMPUTATION DIGIT;  Surgeon: Nadara Mustard, MD;  Location: [facility] OR;  Service: Orthopedics;  Laterality: Right;  right fourth and fifth toe ray amputation   . AMPUTATION Right 12/14/2016   Procedure: AMPUTATION BELOW KNEE RIGHT;  Surgeon: Nadara Mustard, MD;  [facility] OR;  Service: Orthopedics;  Laterality: Right;  . AMPUTATION Right 01/31/2017   Procedure: Right Above Knee Amputation;  Surgeon: Nadara Mustard, MD;  Location: [facility] OR;  Service: Orthopedics;  Laterality: Right;  . APPLICATION OF WOUND VAC Right 02/05/2017   Procedure: APPLICATION OF WOUND VAC;  Surgeon: Nadara Mustard, MD;  [facility] OR;  Service: Orthopedics;  Laterality: Right;  . I&D EXTREMITY  08/02/2012   Procedure: IRRIGATION AND DEBRIDEMENT EXTREMITY;  Surgeon: Nadara Mustard, MD;  Location: [facility] OR;  Service: Orthopedics;  Laterality: Right;  Right Foot Irrigation and Debridement, Place Antibiotic Beads  . I&D EXTREMITY Right 02/16/2017   Procedure: Debridement Right Hip Disarticulation, Placement of Infusional Wound VAC;  Surgeon: Nadara Mustard, MD;  Location: [facility] OR;  Service: Orthopedics;   Laterality: Right;  . INCISION AND DRAINAGE HIP Right 02/20/2017   Procedure: IRRIGATION AND DEBRIDEMENT RIGHT HIP;  Surgeon: Nadara Mustard, MD;  [facility]: [facility] OR;  Service: Orthopedics;  Laterality: Right;  . INCISION AND DRAINAGE HIP Right 02/23/2017   Procedure: REPEAT IRRIGATION AND DEBRIDEMENT RIGHT HIP WITH WOUND CLOSURE AND INCISIONAL VAC PLACEMENT;  Surgeon: Nadara Mustard, MD;  Location: [facility] OR;  Service: Orthopedics;  Laterality: Right;  . SHOULDER SURGERY Bilateral    rotarcuff  . STUMP REVISION Left 07/12/2016   Procedure: Revision Left Below Knee Amputation;  Surgeon: Nadara Mustard, MD;  Location: [facility] OR;  Service: Orthopedics;  Laterality: Left;  . STUMP REVISION Right 01/24/2017   Procedure: Revision Right Below Knee Amputation;  Surgeon: Nadara Mustard, MD;  Location: [facility] OR;  Service: Orthopedics;  Laterality: Right;  . STUMP REVISION Right 02/05/2017   Procedure: REVISION RIGHT AKA;  Surgeon: Nadara Mustard, MD;  Location: [facility] OR;  Service: Orthopedics;  Laterality: Right;  . STUMP REVISION Right 02/07/2017   Procedure: RIGHT HIP DISARTICULATION;  Surgeon: Nadara Mustard, MD;  Location: [facility] OR;  Service: Orthopedics;  Laterality: Right;  . TOE AMPUTATION     multiple toes amputated.   Social History   Occupational History  . Not on file  Tobacco Use  . Smoking status: Current Every Day Smoker    Packs/day: 2.00    Years: 24.00    Pack years: 48.00    Types: Cigarettes  . Smokeless tobacco: Never Used  Substance and Sexual Activity  . Alcohol use: No  . Drug use: No  . Sexual activity: Yes    Birth control/protection: None

## 2018-02-20 ENCOUNTER — Telehealth (INDEPENDENT_AMBULATORY_CARE_PROVIDER_SITE_OTHER): Payer: Self-pay | Admitting: Orthopedic Surgery

## 2018-02-20 NOTE — Telephone Encounter (Signed)
02/18/2018 OV Note faxed to [facility] [phone]. AttnAdelina Mings: Kelsey, ph [phone]

## 2018-02-28 ENCOUNTER — Telehealth (INDEPENDENT_AMBULATORY_CARE_PROVIDER_SITE_OTHER): Payer: Self-pay | Admitting: Orthopedic Surgery

## 2018-02-28 NOTE — Telephone Encounter (Signed)
Patient called requesting an RX for [employer] Motorized Wheelchair.  He request that the prescription be faxed to [phone]. [facility].  CB#[phone].  Thank you.

## 2018-02-28 NOTE — Telephone Encounter (Signed)
Derrick [name] from [facility] called to follow up on some papers for Dr. Lajoyce Cornersuda to sign.  They were the Letter of Medical Necessity and Physician Prosthetic Assessment form.  CB# [phone].  Thank you.

## 2018-03-01 NOTE — Telephone Encounter (Signed)
Advised pt Rx has been faxed.

## 2018-03-05 ENCOUNTER — Telehealth (INDEPENDENT_AMBULATORY_CARE_PROVIDER_SITE_OTHER): Payer: Self-pay | Admitting: Orthopedic Surgery

## 2018-03-05 NOTE — Telephone Encounter (Signed)
Derrick [name] w/ [facility] called requesting the face to face ov note for the 03/01/2018 Rx for motorizied wheelchair. I told her patient was not seen that day, patient had called requesting the rx for the W/C and Dr. Lajoyce Cornersuda complied and we [facility]. Derrick ChrisRene said she would contact the patient and tell him him to schedule a face to face appt for the wheelchair.

## 2018-03-06 NOTE — Telephone Encounter (Signed)
Derrick [name] from [facility] left a vm message wanting to follow up from the previous message.  Her CB#[phone] and fax [phone].  Thank you.

## 2018-03-07 NOTE — Telephone Encounter (Signed)
No note her in office. [place] with [facility] to advise due to faxing issues in our office he will hand deliver paperwork for signature.

## 2018-03-11 NOTE — Telephone Encounter (Signed)
[facility]  Adelina MingsKelsey  [phone]  [facility] needs paperwork complete for pt so they can deliver prosthesis on 03/13/18.

## 2018-03-13 NOTE — Telephone Encounter (Signed)
I called and sw Adelina MingsKelsey and advised to send fax to [phone] and I will have Dr. Lajoyce Cornersuda to sign first thing tomorrow morning ( he is in the OR all day to day) I advised of the multiple times our lines have been down due to construction and that I will await the fax this morning and call if it does not come through.

## 2018-03-13 NOTE — Telephone Encounter (Signed)
Paper work received and on desk for [employer] tomorrow

## 2018-03-27 ENCOUNTER — Telehealth (INDEPENDENT_AMBULATORY_CARE_PROVIDER_SITE_OTHER): Payer: Self-pay | Admitting: Orthopedic Surgery

## 2018-03-27 NOTE — Telephone Encounter (Signed)
Derrick [name] with [facility] called asked for a call back concerning a form she faxed over 03/22/18. Derrick [name] asked for a call back. The number to contact [name] is [phone]

## 2018-04-01 NOTE — Telephone Encounter (Signed)
This is the paperwork that just needed the initial by Dr. Lajoyce Cornersuda. Do you still have this paperwork?

## 2018-04-02 NOTE — Telephone Encounter (Signed)
Yes. I faxed back 4/10.I placed copy on your desk.

## 2018-04-02 NOTE — Telephone Encounter (Signed)
Faxed from surgery schedule fax.

## 2018-04-30 ENCOUNTER — Ambulatory Visit (INDEPENDENT_AMBULATORY_CARE_PROVIDER_SITE_OTHER): Payer: Medicare Other | Admitting: Orthopedic Surgery

## 2018-04-30 DIAGNOSIS — Z89512 Acquired absence of left leg below knee: Secondary | ICD-10-CM

## 2018-04-30 DIAGNOSIS — G8929 Other chronic pain: Secondary | ICD-10-CM

## 2018-04-30 DIAGNOSIS — R2241 Localized swelling, mass and lump, right lower limb: Secondary | ICD-10-CM

## 2018-04-30 DIAGNOSIS — M25512 Pain in left shoulder: Secondary | ICD-10-CM | POA: Diagnosis not present

## 2018-04-30 DIAGNOSIS — Z89621 Acquired absence of right hip joint: Secondary | ICD-10-CM | POA: Diagnosis not present

## 2018-04-30 MED ORDER — OXYCODONE-ACETAMINOPHEN 10-325 MG PO TABS: 1.0000 | ORAL_TABLET | Freq: Three times a day (TID) | ORAL | 0 refills | Status: AC | PRN

## 2018-05-01 ENCOUNTER — Encounter (INDEPENDENT_AMBULATORY_CARE_PROVIDER_SITE_OTHER): Payer: Self-pay | Admitting: Orthopedic Surgery

## 2018-05-01 NOTE — Progress Notes (Signed)
Office Visit Note   Patient: Derrick [name]           Date of Birth: [birth year]           MRN: [id number] Visit Date: 04/30/2018              Requested by: No referring provider defined for this encounter. PCP: Patient, [name]  No chief complaint on file.     HPI: Patient is a 47 year old gentleman who presents with 2 separate issues #1 he has a painful mass anteriorly from the previous hip disarticulation.  Patient states that he is having difficulty trying to be fit with a prosthesis due to the painful mass.  He states he also developed a blister over the left knee about a week ago.  Blister is directly over the patella.  He states he is been unable to wear his prosthesis due to the blister.  Patient still has left shoulder symptoms status post arthroscopic debridement for rotator cuff pathology and intra-articular pathology.  Patient is diabetic with hypertension and is still smoking.  Patient states that he will not stop smoking.  Assessment & Plan: Visit Diagnoses:  1. History of disarticulation of right hip   2. 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 Mass of right hip region   4. Chronic left shoulder pain     Plan: Recommended continuing to keep the prosthesis off on the left leg to allow this blister to heal recommend that he wear the medical compression stump shrinker to facilitate healing of the blister areas recommended not crawling on the left knee.  Patient will need a motorized wheelchair we will complete the paperwork for this.  With patient's inability to use his upper extremities to power a manual wheelchair his inability to be fit with prosthesis at this time for both lower extremities patient's only means of transportation will be a motorized wheelchair.  Patient will need the motorized wheelchair for activities of daily living including around the home.  He has developed physical strength and condition to use the motorized wheelchair  he does not have capabilities to use a manual wheelchair at this time.  Patient cannot use a walker due to his left shoulder pathology.  This shoulder pathology also limits his use of being able to use a manual wheelchair for activities of daily living.  Patient is able to transfer himself from sitting to transfer currently he cannot gait training due to inability to be fit with a prosthesis for the right lower extremity and inability to use his left transtibial amputation residual limb.  Patient is physically mentally able to use a power motor wheelchair.  Patient does not have the stability or balance to use a scooter.  Follow-Up Instructions: Return if symptoms worsen or fail to improve.   Ortho Exam  Patient is alert, oriented, no adenopathy, well-dressed, normal affect, normal respiratory effort.   Examination patient has a superficial blister over the patella left knee there is no joint effusion no redness no cellulitis no signs of intra-articular pathology.  This area the blister is superior to where the prosthesis would fit.  This is approximately 3 cm in diameter and 2.1 mm deep with healthy granulation tissue at the base.  Examination of the right leg patient does have a mass anteriorly and superior to the incision directly over the hip disarticulation.  There is no redness no cellulitis no drainage no open wound no signs of  infection.  The mass is extremely painful to palpation.  Imaging: No results found. No images are attached to the encounter.  Labs: Lab Results  Component Value Date   HGBA1C 14.5 (H) 12/13/2016   HGBA1C 8.8 (H) 05/31/2012   HGBA1C 9.3 (H) 04/12/2012   ESRSEDRATE 30 (H) 04/20/2010   ESRSEDRATE 37 (H) 04/18/2010   ESRSEDRATE 14 08/12/2009   CRP 6.2 (H) 04/18/2010   REPTSTATUS 02/24/2017 FINAL 02/19/2017   GRAMSTAIN  02/16/2017    ABUNDANT WBC PRESENT, PREDOMINANTLY MONONUCLEAR ABUNDANT GRAM POSITIVE COCCI IN CLUSTERS IN PAIRS ABUNDANT GRAM NEGATIVE RODS     CULT NO GROWTH 5 DAYS 02/19/2017   LABORGA GROUP B STREP(S.AGALACTIAE)ISOLATED 01/31/2017     Lab Results  Component Value Date   ALBUMIN 1.7 (L) 02/19/2017   ALBUMIN 1.8 (L) 02/19/2017   ALBUMIN 2.3 (L) 12/13/2016    There is no height or weight on file to calculate BMI.  Orders:  No orders of the defined types were placed in this encounter.  Meds ordered this encounter  Medications  . oxyCODONE-acetaminophen (PERCOCET) 10-325 MG tablet    Sig: Take 1 tablet by mouth every 8 (eight) hours as needed for pain.    Dispense:  21 tablet    Refill:  0     Procedures: No procedures performed  Clinical Data: No additional findings.  ROS:  All other systems negative, except as noted in the HPI. Review of Systems  Objective: Vital Signs: There were no vitals taken for this visit.  Specialty Comments:  No specialty comments available.  PMFS History: Patient Active Problem List   Diagnosis Date Noted  . 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 Postoperative infection   . History of disarticulation of right hip 02/14/2017  . Fever   . Wound infection 02/05/2017  . Post-operative pain   . Amputation stump infection (HCC) 02/04/2017  . Dehiscence of amputation stump (HCC) 01/30/2017  . Abscess of right leg   . 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 Below knee amputation status, right (HCC) 01/06/2017  . Diabetic wet gangrene of the foot (HCC)   . Diabetic foot infection (HCC) 12/13/2016  . HTN (hypertension) 12/13/2016  . Severe sepsis with septic shock (HCC) 12/13/2016  . Hypokalemia 12/13/2016  . Chronic osteomyelitis involving ankle and foot (HCC) 06/03/2012  . Syncope and collapse 04/12/2012  . Tobacco abuse 04/12/2012  . Cellulitis 02/25/2012  . Diabetic toe ulcer (HCC) 02/25/2012  . Type 2 diabetes mellitus with complication, with long-term current use of insulin (HCC) 02/25/2012  . Leukocytosis  02/25/2012   Past Medical History:  Diagnosis Date  . Anemia   . Arthritis   . Diabetes mellitus    Type II  . GERD (gastroesophageal reflux disease)   . Neuropathy   . Peripheral neuropathy   . Pneumonia   . Prostatitis     Family History  Problem Relation Age of Onset  . Cancer Mother   . Congestive Heart Failure Father     Past Surgical History:  Procedure Laterality Date  . AMPUTATION  05/31/2012   Procedure: AMPUTATION DIGIT;  Surgeon: Nadara Mustard, MD;  Location: [facility];  Service: Orthopedics;  Laterality: Right;  right fourth and fifth toe ray amputation   . AMPUTATION Right 12/14/2016   Procedure: AMPUTATION BELOW KNEE RIGHT;  Surgeon: Nadara Mustard, MD;  [facility] OR;  Service: Orthopedics;  Laterality: Right;  . AMPUTATION Right  01/31/2017   Procedure: Right Above Knee Amputation;  Surgeon: Nadara Mustard, MD;  Location: [facility] OR;  Service: Orthopedics;  Laterality: Right;  . APPLICATION OF WOUND VAC Right 02/05/2017   Procedure: APPLICATION OF WOUND VAC;  Surgeon: Nadara Mustard, MD;  [facility]: [facility] OR;  Service: Orthopedics;  Laterality: Right;  . I&D EXTREMITY  08/02/2012   Procedure: IRRIGATION AND DEBRIDEMENT EXTREMITY;  Surgeon: Nadara Mustard, MD;  Location: [facility] OR;  Service: Orthopedics;  Laterality: Right;  Right Foot Irrigation and Debridement, Place Antibiotic Beads  . I&D EXTREMITY Right 02/16/2017   Procedure: Debridement Right Hip Disarticulation, Placement of Infusional Wound VAC;  Surgeon: Nadara Mustard, MD;  Location: [facility] OR;  Service: Orthopedics;  Laterality: Right;  . INCISION AND DRAINAGE HIP Right 02/20/2017   Procedure: IRRIGATION AND DEBRIDEMENT RIGHT HIP;  Surgeon: Nadara Mustard, MD;  Location: [facility] OR;  Service: Orthopedics;  Laterality: Right;  . INCISION AND DRAINAGE HIP Right 02/23/2017   Procedure: REPEAT IRRIGATION AND DEBRIDEMENT RIGHT HIP WITH WOUND CLOSURE AND INCISIONAL VAC PLACEMENT;  Surgeon: Nadara Mustard, MD;  Location: [facility] OR;  Service: Orthopedics;   Laterality: Right;  . SHOULDER SURGERY Bilateral    rotarcuff  . STUMP REVISION Left 07/12/2016   Procedure: Revision Left Below Knee Amputation;  Surgeon: Nadara Mustard, MD;  Location: [facility] OR;  Service: Orthopedics;  Laterality: Left;  . STUMP REVISION Right 01/24/2017   Procedure: Revision Right Below Knee Amputation;  Surgeon: Nadara Mustard, MD;  Location: [facility] OR;  Service: Orthopedics;  Laterality: Right;  . STUMP REVISION Right 02/05/2017   Procedure: REVISION RIGHT AKA;  Surgeon: Nadara Mustard, MD;  Location: [facility] OR;  Service: Orthopedics;  Laterality: Right;  . STUMP REVISION Right 02/07/2017   Procedure: RIGHT HIP DISARTICULATION;  Surgeon: Nadara Mustard, MD;  [facility] OR;  Service: Orthopedics;  Laterality: Right;  . TOE AMPUTATION     multiple toes amputated.   Social History   Occupational History  . Not on file  Tobacco Use  . Smoking status: Current Every Day Smoker    Packs/day: 2.00    Years: 24.00    Pack years: 48.00    Types: Cigarettes  . Smokeless tobacco: Never Used  Substance and Sexual Activity  . Alcohol use: No  . Drug use: No  . Sexual activity: Yes    Birth control/protection: None

## 2018-05-10 ENCOUNTER — Telehealth (INDEPENDENT_AMBULATORY_CARE_PROVIDER_SITE_OTHER): Payer: Self-pay | Admitting: Orthopedic Surgery

## 2018-05-10 NOTE — Telephone Encounter (Signed)
I brough this paperwork to you I am pretty sure this is the number on the paperwork but just incase.

## 2018-05-10 NOTE — Telephone Encounter (Signed)
Error, other message in chart °

## 2018-05-10 NOTE — Telephone Encounter (Signed)
Since patient had their face to face, they now are needing the order for the power wheelchair faxed to # [phone]

## 2018-05-14 NOTE — Telephone Encounter (Signed)
Faxed 5/24. I refaxed

## 2018-05-21 ENCOUNTER — Ambulatory Visit (INDEPENDENT_AMBULATORY_CARE_PROVIDER_SITE_OTHER): Payer: Medicare Other | Admitting: Orthopedic Surgery

## 2018-05-27 ENCOUNTER — Telehealth (INDEPENDENT_AMBULATORY_CARE_PROVIDER_SITE_OTHER): Payer: Self-pay | Admitting: Orthopedic Surgery

## 2018-05-27 NOTE — Telephone Encounter (Signed)
[employer]  [phone]  [phone]    Dr.Duda Sig needed  Formed Faxed   7 element order request   Additional request   Height and Weight

## 2018-05-28 NOTE — Telephone Encounter (Signed)
In signature file for Dr. Lajoyce Cornersuda will fax once this has been signed.

## 2018-06-03 ENCOUNTER — Telehealth (INDEPENDENT_AMBULATORY_CARE_PROVIDER_SITE_OTHER): Payer: Self-pay | Admitting: Orthopedic Surgery

## 2018-06-03 NOTE — Telephone Encounter (Signed)
Patient called stated wrong wheel chair was ordered. He needed [employer] and [employer] ordered a 200lb chair and he only needs a 40lb lightweight wheelchair(power)  Please call to advise patient

## 2018-06-03 NOTE — Telephone Encounter (Signed)
I called pt and lm on vm to advise that we wrote the rx for an Derrick [name] because that is what the pt stated that he needed. We do not know the names of these devices and we are not part of the product selection. We signed the detailed order based of the information from the pt and the company only testifying for the need. On 03/01/18 and 04/04/18 we faxed over the rx with the wheelchair the pt specifically asked for. If there is something that we can do to help him get what he is needed to call and let me know. Lm on vm

## 2018-12-26 ENCOUNTER — Telehealth (INDEPENDENT_AMBULATORY_CARE_PROVIDER_SITE_OTHER): Payer: Self-pay | Admitting: Orthopedic Surgery

## 2018-12-26 NOTE — Telephone Encounter (Signed)
Records release form emailed to Derrick KaufmannMelissa (pts girlfriend) for patient to complete and sign [email or web address] , ph [phone]

## 2019-01-01 ENCOUNTER — Telehealth (INDEPENDENT_AMBULATORY_CARE_PROVIDER_SITE_OTHER): Payer: Self-pay | Admitting: Orthopedic Surgery

## 2019-01-01 NOTE — Telephone Encounter (Signed)
Release form mailed to patient with highlighted areas to fill in (release dates)

## 2019-05-06 ENCOUNTER — Other Ambulatory Visit: Payer: Self-pay

## 2019-05-06 ENCOUNTER — Telehealth: Payer: Self-pay | Admitting: Orthopedic Surgery

## 2019-05-06 NOTE — Telephone Encounter (Signed)
Pt called asking for a rx to be faxed to [facility]  Fax # [phone] Doristine [employer] select 6 power chair

## 2019-05-06 NOTE — Telephone Encounter (Signed)
Can you print ?

## 2019-06-06 ENCOUNTER — Telehealth: Payer: Self-pay

## 2019-06-06 NOTE — Telephone Encounter (Signed)
See other message

## 2019-06-09 ENCOUNTER — Telehealth: Payer: Self-pay | Admitting: Orthopedic Surgery

## 2019-06-09 NOTE — Telephone Encounter (Signed)
05/01/2019 ov note faxed to [facility] w/ 7 element order for power wheelchair [phone]

## 2019-06-10 ENCOUNTER — Telehealth: Payer: Self-pay

## 2019-06-10 ENCOUNTER — Telehealth: Payer: Self-pay | Admitting: Orthopedic Surgery

## 2019-06-10 NOTE — Telephone Encounter (Signed)
Derrick [name] with [employer] [facility] is requesting clinical notes for pt, he requesting a a wheelchair from ([place]) Derrick [name]'s callback #  [phone]

## 2019-06-10 NOTE — Telephone Encounter (Signed)
Pt called and and lm on vm to request that I call him back. Did not leave a reason why.

## 2019-06-10 NOTE — Telephone Encounter (Signed)
This is a duplicate I will sign off on this message

## 2019-06-10 NOTE — Telephone Encounter (Signed)
I called pt and advised that I had spoken to the power mobility device company and advised that I would fax over the information that I have for the pt last office note and dictated letter outlining need. Pt will call with any questions. The first place that requested all the information is not in network for the pt so he has to start the process all over again.

## 2019-06-10 NOTE — Telephone Encounter (Signed)
Patient called asked for a call back concerning needing an Auth for a power wheelchair. The number to contact patient is [phone]

## 2019-06-10 NOTE — Telephone Encounter (Signed)
I called and sw Amy and she asked for notes from last visit and the letter that was written this May and see if insurance will cover. I advised that the pt may need to make an appt for this year for insurance to accept the notes and to have him call for an appt if that is the case.

## 2019-06-11 ENCOUNTER — Telehealth: Payer: Self-pay | Admitting: Orthopedic Surgery

## 2019-06-11 NOTE — Telephone Encounter (Signed)
Pt left message requesting a call from [employer] regarding authorization for a power wheelchair. Pt's call back # [phone]

## 2019-06-13 NOTE — Telephone Encounter (Signed)
I called pt and lm on vm to advise to call and leave a detailed message about what he needs. I am happy to take cae of whatever he needs just leave a message and let me know specifically what he is needed. Will sign off on this message and await return phone call.

## 2019-06-16 ENCOUNTER — Telehealth: Payer: Self-pay

## 2019-06-16 NOTE — Telephone Encounter (Signed)
Called and LM ON VM  to advise that the pt needs an in home assessment for power wheelchair and that this is missing from the documentation that they received. Asking for a return call to discuss.

## 2019-06-17 NOTE — Telephone Encounter (Signed)
I called [facility] [phone] and they advised that the pt will need a new order from another mobility device office and the one that the pt initiated does not participate with his insurance. She advised that I would have to call [employer] at [phone] to begin the process and that they would fax paperwork to me for completion and start the process all over again.

## 2019-06-18 NOTE — Telephone Encounter (Signed)
I called the seating mobility office and they said that they would need demographic sheet, insurance card, rx for power wheelchair and a chart note within the past 45 days outlining specific need for mobiliyt aid. That information can be faxed to [phone]. Once this has been done they will fax forms to the office for PT and OT eval once signed they will make an appt for the pt for seat clinic to do home assesmesnt and find the most appropriate mobility aid for the pt. I called the pt to advise of all this information. LM ON Vm to call and make an appt so that we can proceed.

## 2019-06-24 ENCOUNTER — Encounter: Payer: Self-pay | Admitting: Orthopedic Surgery

## 2019-06-24 ENCOUNTER — Ambulatory Visit (INDEPENDENT_AMBULATORY_CARE_PROVIDER_SITE_OTHER): Payer: Medicare HMO | Admitting: Orthopedic Surgery

## 2019-06-24 ENCOUNTER — Other Ambulatory Visit: Payer: Self-pay

## 2019-06-24 VITALS — Ht 73.0 in | Wt 200.0 lb

## 2019-06-24 DIAGNOSIS — Z89512 Acquired absence of left leg below knee: Secondary | ICD-10-CM

## 2019-06-24 DIAGNOSIS — Z89621 Acquired absence of right hip joint: Secondary | ICD-10-CM

## 2019-06-24 NOTE — Progress Notes (Signed)
Office Visit Note   Patient: Derrick [name]           Date of Birth: 1971/09/03           MRN: [id number] Visit Date: 06/24/2019              Requested by: No referring provider defined for this encounter. PCP: Patient, [name]  Chief Complaint  Patient presents with  . Right Hip - Pain      HPI: Patient is a 48 year old gentleman who presents for evaluation for a lightweight motorized wheelchair.  Patient was previously approved for a full weight wheelchair neither he nor his wife would be able to lift the chair.  Assessment & Plan: Visit Diagnoses:  1. History of disarticulation of right hip   2. 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  Follow-Up Instructions: Return if symptoms worsen or fail to improve.   Ortho Exam  Patient is alert, oriented, no adenopathy, well-dressed, normal affect, normal respiratory effort. Examination patient has a well-healed right hip disarticulation he has a stable well-healed left transtibial amputation.  Patient is unable to ambulate with prosthesis.  His only option is a motorized wheelchair.  Patient has the knowledge and desire for mobility and has the ability to utilize the function of a lightweight wheelchair.  Patient is still smoking uncontrolled  diabetes with severe protein caloric malnutrition.  Imaging: No results found. No images are attached to the encounter.  Labs: Lab Results  Component Value Date   HGBA1C 14.5 (H) 12/13/2016   HGBA1C 8.8 (H) 05/31/2012   HGBA1C 9.3 (H) 04/12/2012   ESRSEDRATE 30 (H) 04/20/2010   ESRSEDRATE 37 (H) 04/18/2010   ESRSEDRATE 14 08/12/2009   CRP 6.2 (H) 04/18/2010   REPTSTATUS 02/24/2017 FINAL 02/19/2017   GRAMSTAIN  02/16/2017    ABUNDANT WBC PRESENT, PREDOMINANTLY MONONUCLEAR ABUNDANT GRAM POSITIVE COCCI IN CLUSTERS IN PAIRS ABUNDANT GRAM NEGATIVE RODS    CULT NO  GROWTH 5 DAYS 02/19/2017   LABORGA GROUP B STREP(S.AGALACTIAE)ISOLATED 01/31/2017     Lab Results  Component Value Date   ALBUMIN 1.7 (L) 02/19/2017   ALBUMIN 1.8 (L) 02/19/2017   ALBUMIN 2.3 (L) 12/13/2016    Lab Results  Component Value Date   MG 1.1 (L) 12/13/2016   No results found for: VD25OH  No results found for: PREALBUMIN CBC EXTENDED Latest Ref Rng & Units 02/26/2017 02/25/2017 02/24/2017  WBC 4.0 - 10.5 K/uL 13.0(H) 12.6(H) 13.8(H)  RBC 4.22 - 5.81 MIL/uL 3.18(L) 3.05(L) 3.13(L)  HGB 13.0 - 17.0 g/dL 8.3(L) 8.0(L) 8.3(L)  HCT 39.0 - 52.0 % 26.5(L) 25.5(L) 26.2(L)  PLT 150 - 400 K/uL 685(H) 672(H) 656(H)  NEUTROABS 1.7 - 7.7 K/uL - - -  LYMPHSABS 0.7 - 4.0 K/uL - - -     Body mass index is 26.39 kg/m.  Orders:  No orders of the defined types were placed in this encounter.  No orders of the defined types were placed in this encounter.    Procedures: No procedures performed  Clinical Data: No additional findings.  ROS:  All other systems negative, except as noted in the HPI. Review of Systems  Objective: Vital Signs: Ht 6\' 1"  (1.854 m)   Wt 200 lb (90.7 kg)   BMI 26.39 kg/m   Specialty Comments:  No specialty comments available.  PMFS History: Patient Active Problem List  Diagnosis Date Noted  . 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 Postoperative infection   . History of disarticulation of right hip 02/14/2017  . Fever   . Wound infection 02/05/2017  . Post-operative pain   . Amputation stump infection (HCC) 02/04/2017  . Dehiscence of amputation stump (HCC) 01/30/2017  . Abscess of right leg   . 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 Below knee amputation status, right 01/06/2017  . Diabetic wet gangrene of the foot (HCC)   . Diabetic foot infection (HCC) 12/13/2016  . HTN (hypertension) 12/13/2016  . Severe sepsis with septic shock (HCC) 12/13/2016  . Hypokalemia 12/13/2016   . Chronic osteomyelitis involving ankle and foot (HCC) 06/03/2012  . Syncope and collapse 04/12/2012  . Tobacco abuse 04/12/2012  . Cellulitis 02/25/2012  . Diabetic toe ulcer (HCC) 02/25/2012  . Type 2 diabetes mellitus with complication, with long-term current use of insulin (HCC) 02/25/2012  . Leukocytosis 02/25/2012   Past Medical History:  Diagnosis Date  . Anemia   . Arthritis   . Diabetes mellitus    Type II  . GERD (gastroesophageal reflux disease)   . Neuropathy   . Peripheral neuropathy   . Pneumonia   . Prostatitis     Family History  Problem Relation Age of Onset  . Cancer Mother   . Congestive Heart Failure Father     Past Surgical History:  Procedure Laterality Date  . AMPUTATION  05/31/2012   Procedure: AMPUTATION DIGIT;  Surgeon: Nadara MustardMarcus V Duda, MD;  [facility];  Service: Orthopedics;  Laterality: Right;  right fourth and fifth toe ray amputation   . AMPUTATION Right 12/14/2016   Procedure: AMPUTATION BELOW KNEE RIGHT;  Surgeon: Nadara MustardMarcus Duda V, MD;  [facility] OR;  Service: Orthopedics;  Laterality: Right;  . AMPUTATION Right 01/31/2017   Procedure: Right Above Knee Amputation;  Surgeon: Nadara MustardMarcus Duda V, MD;  [facility];  Service: Orthopedics;  Laterality: Right;  . APPLICATION OF WOUND VAC Right 02/05/2017   Procedure: APPLICATION OF WOUND VAC;  Surgeon: Nadara MustardMarcus Duda V, MD;  [facility]: [facility] OR;  Service: Orthopedics;  Laterality: Right;  . I&D EXTREMITY  08/02/2012   Procedure: IRRIGATION AND DEBRIDEMENT EXTREMITY;  Surgeon: Nadara MustardMarcus V Duda, MD;  Location: [facility] OR;  Service: Orthopedics;  Laterality: Right;  Right Foot Irrigation and Debridement, Place Antibiotic Beads  . I&D EXTREMITY Right 02/16/2017   Procedure: Debridement Right Hip Disarticulation, Placement of Infusional Wound VAC;  Surgeon: Nadara MustardMarcus Duda V, MD;  [facility]: [facility] OR;  Service: Orthopedics;  Laterality: Right;  . INCISION AND DRAINAGE HIP Right 02/20/2017   Procedure: IRRIGATION AND DEBRIDEMENT RIGHT  HIP;  Surgeon: Nadara MustardMarcus Duda V, MD;  Location[facility] OR;  Service: Orthopedics;  Laterality: Right;  . INCISION AND DRAINAGE HIP Right 02/23/2017   Procedure: REPEAT IRRIGATION AND DEBRIDEMENT RIGHT HIP WITH WOUND CLOSURE AND INCISIONAL VAC PLACEMENT;  Surgeon: Nadara MustardMarcus Duda V, MD;  Location: [facility] OR;  Service: Orthopedics;  Laterality: Right;  . SHOULDER SURGERY Bilateral    rotarcuff  . STUMP REVISION Left 07/12/2016   Procedure: Revision Left Below Knee Amputation;  Surgeon: Nadara MustardMarcus V Duda, MD;  [facility];  Service: Orthopedics;  Laterality: Left;  . STUMP REVISION Right 01/24/2017   Procedure: Revision Right Below Knee Amputation;  Surgeon: Nadara MustardMarcus Duda V, MD;  [facility];  Service: Orthopedics;  Laterality: Right;  . STUMP REVISION Right 02/05/2017   Procedure:  REVISION RIGHT AKA;  Surgeon: Nadara MustardMarcus Duda V, MD;  [facility];  Service: Orthopedics;  Laterality: Right;  . STUMP REVISION Right 02/07/2017   Procedure: RIGHT HIP DISARTICULATION;  Surgeon: Nadara MustardMarcus Duda V, MD;  [facility];  Service: Orthopedics;  Laterality: Right;  . TOE AMPUTATION     multiple toes amputated.   Social History   Occupational History  . Not on file  Tobacco Use  . Smoking status: Current Every Day Smoker    Packs/day: 2.00    Years: 24.00    Pack years: 48.00    Types: Cigarettes  . Smokeless tobacco: Never Used  Substance and Sexual Activity  . Alcohol use: No  . Drug use: No  . Sexual activity: Yes    Birth control/protection: None

## 2019-06-26 ENCOUNTER — Telehealth: Payer: Self-pay | Admitting: Orthopedic Surgery

## 2019-06-26 NOTE — Telephone Encounter (Signed)
I via faxed patient's information on 06/26/2019 to [employer] at [phone]

## 2019-06-26 NOTE — Telephone Encounter (Signed)
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  Please fax info [phone]

## 2019-07-01 ENCOUNTER — Telehealth: Payer: Self-pay | Admitting: Orthopedic Surgery

## 2019-07-01 NOTE — Telephone Encounter (Signed)
I am waiting for the paperwork to be completed. Will fax notes and paperwork together when it is completed

## 2019-07-01 NOTE — Telephone Encounter (Signed)
Paperwork has not been completed yet. I will fax back once it is completed

## 2019-07-01 NOTE — Telephone Encounter (Signed)
Received call from Darius Bump with [employer] needing office notes faxed to her. She is also faxing over PT/OT forms. The fax# is [phone] The number to contact Darius Bump is [phone]

## 2019-07-01 NOTE — Telephone Encounter (Signed)
Can you please fax this info as well?

## 2019-07-01 NOTE — Telephone Encounter (Signed)
Patient called wanting a Rx for repair for his prosthetic in [facility] @ Nurse, adult for [place] [phone]

## 2019-07-01 NOTE — Telephone Encounter (Signed)
Patient called stating the [employer] have not received the fax yet. Patient asked if it can be faxed again? The fax# is [phone]

## 2019-07-01 NOTE — Telephone Encounter (Signed)
pls advise

## 2019-07-01 NOTE — Telephone Encounter (Signed)
Can you refax if you have this?

## 2019-07-04 ENCOUNTER — Telehealth: Payer: Self-pay

## 2019-07-04 NOTE — Telephone Encounter (Signed)
Patient paperwork was filled out b0y myself S.Aleria Maheu, CMA (AAMA) and signed by S.Rayburn, P.A. today on 07/04/2019 for [employer] pertaining PT/OT Forms for patient.

## 2019-07-14 ENCOUNTER — Telehealth: Payer: Self-pay

## 2019-07-14 ENCOUNTER — Telehealth: Payer: Self-pay | Admitting: Orthopedic Surgery

## 2019-07-14 NOTE — Telephone Encounter (Signed)
Heather/[facility]  Called and received order from [facility] and where it asks for Physicians signature can they print the name and then sign. Its hard for her to see it.  Refax [phone]

## 2019-07-14 NOTE — Telephone Encounter (Signed)
Last dictation faxed to the number provided.

## 2019-07-14 NOTE — Telephone Encounter (Addendum)
Heather with [facility] PT would like last history and physical faxed to [phone].  Cb# is [phone].  Please advise.  Thank you.

## 2019-07-15 NOTE — Telephone Encounter (Signed)
This has been done.

## 2019-07-22 ENCOUNTER — Telehealth: Payer: Self-pay | Admitting: Orthopedic Surgery

## 2019-07-22 NOTE — Telephone Encounter (Signed)
Heather from [facility] left a voicemail message in regards to this patient's order for wheelchair clinic evaluation.  She faxed the order last Friday.  She would like for you to call her. CB#[phone].   Thank you.

## 2019-07-23 ENCOUNTER — Telehealth: Payer: Self-pay

## 2019-07-23 NOTE — Telephone Encounter (Signed)
FYI: ptient forms were re-faxed to [phone] to Attn: Nira Conn with NP signature, printed name and date. Nira Conn was notified of these changes and information.

## 2019-08-21 ENCOUNTER — Telehealth: Payer: Self-pay | Admitting: Orthopedic Surgery

## 2019-08-21 NOTE — Telephone Encounter (Signed)
06/24/19 ov note faxed [place] [phone]

## 2019-09-11 ENCOUNTER — Encounter: Payer: Self-pay | Admitting: Orthopedic Surgery

## 2019-09-11 ENCOUNTER — Other Ambulatory Visit: Payer: Self-pay

## 2019-09-11 ENCOUNTER — Ambulatory Visit (INDEPENDENT_AMBULATORY_CARE_PROVIDER_SITE_OTHER): Payer: Medicare HMO | Admitting: Orthopedic Surgery

## 2019-09-11 VITALS — Ht 73.0 in | Wt 200.0 lb

## 2019-09-11 DIAGNOSIS — Z89512 Acquired absence of left leg below knee: Secondary | ICD-10-CM

## 2019-09-11 DIAGNOSIS — Z89621 Acquired absence of right hip joint: Secondary | ICD-10-CM | POA: Diagnosis not present

## 2019-09-11 NOTE — Progress Notes (Signed)
Office Visit Note   Patient: Derrick [name]           Date of Birth: 06/18/1971           MRN: [id number] Visit Date: 09/11/2019              Requested by: No referring provider defined for this encounter. PCP: Patient, [name]  Chief Complaint  Patient presents with  . Right Leg - Follow-up    Hip disarticulation  . 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  Assessment & Plan: Visit Diagnoses: No diagnosis found.  Plan: Recommend patient wear the stump shrinker beneath the silicone liner leave space at the top so the silicone can provide a good suction fit to the skin this should decrease the sweating and improve risk of developing blisters.  Patient had a face-to-face evaluation and patient will need a power wheelchair.  Follow-Up Instructions: Return if symptoms worsen or fail to improve.   Ortho Exam  Patient is alert, oriented, no adenopathy, well-dressed, normal affect, normal respiratory effort. Examination patient has a stable left transtibial amputation.  He has a healed blister over the patella this appears to have developed from sweating beneath the silicone liner.  Recommend he wear the stump shrinker beneath the silicone liner to prevent the sweating and blistering.  With patient wearing his prosthesis on the left for very short periods of time he does develop blisters and ulceration.  Patient's weight is 200 pounds BMI 26.39.  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  Patient underwent a face-to-face mobility exam.  Patient is unable to use a cane walker or wheelchair to meet his in-home mobility needs since he cannot wear  the prosthesis on the left long enough to ambulate and does not have the upper arm strength to ambulate with no legs.  Patient has rotator cuff pathology and does not have the strength in his shoulders.  Without being able to wear his prosthesis in the left patient is unable to ambulate within the home for his activities of daily living.  Patient does not have the upper extremity strength to propel a manual wheelchair within the home.  Patient does have the strength cognitive ability and desire to use a power wheelchair.  A power scooter would not be appropriate for his activities of daily living due to his problems with balance.  Imaging: No results found. No images are attached to the encounter.  Labs: Lab Results  Component Value Date   HGBA1C 14.5 (H) 12/13/2016   HGBA1C 8.8 (H) 05/31/2012   HGBA1C 9.3 (H) 04/12/2012   ESRSEDRATE 30 (H) 04/20/2010   ESRSEDRATE 37 (H) 04/18/2010   ESRSEDRATE 14 08/12/2009   CRP 6.2 (H) 04/18/2010   REPTSTATUS 02/24/2017 FINAL 02/19/2017   GRAMSTAIN  02/16/2017    ABUNDANT WBC PRESENT, PREDOMINANTLY MONONUCLEAR ABUNDANT GRAM POSITIVE COCCI IN CLUSTERS IN PAIRS ABUNDANT GRAM NEGATIVE RODS    CULT NO GROWTH 5 DAYS 02/19/2017   LABORGA GROUP B STREP(S.AGALACTIAE)ISOLATED 01/31/2017     Lab Results  Component Value Date   ALBUMIN 1.7 (L) 02/19/2017   ALBUMIN 1.8 (L)  02/19/2017   ALBUMIN 2.3 (L) 12/13/2016    Lab Results  Component Value Date   MG 1.1 (L) 12/13/2016   No results found for: VD25OH  No results found for: PREALBUMIN CBC EXTENDED Latest Ref Rng & Units 02/26/2017 02/25/2017 02/24/2017  WBC 4.0 - 10.5 K/uL 13.0(H) 12.6(H) 13.8(H)  RBC 4.22 - 5.81 MIL/uL 3.18(L) 3.05(L) 3.13(L)  HGB 13.0 - 17.0 g/dL 8.3(L) 8.0(L) 8.3(L)  HCT 39.0 - 52.0 % 26.5(L) 25.5(L) 26.2(L)  PLT 150 - 400 K/uL 685(H) 672(H) 656(H)  NEUTROABS 1.7 - 7.7 K/uL - - -  LYMPHSABS 0.7 - 4.0 K/uL - - -     Body mass index is 26.39 kg/m.  Orders:  No orders  of the defined types were placed in this encounter.  No orders of the defined types were placed in this encounter.    Procedures: No procedures performed  Clinical Data: No additional findings.  ROS:  All other systems negative, except as noted in the HPI. Review of Systems  Objective: Vital Signs: Ht 6\' 1"  (1.854 m)   Wt 200 lb (90.7 kg)   BMI 26.39 kg/m   Specialty Comments:  No specialty comments available.  PMFS History: Patient Active Problem List   Diagnosis Date Noted  . Hx of amputation of leg through tibia and fibula, left ([place]) 02/07/2018  . Postoperative infection   . History of disarticulation of right hip 02/14/2017  . Fever   . Wound infection 02/05/2017  . Post-operative pain   . Amputation stump infection ([place]) 02/04/2017  . Dehiscence of amputation stump ([place]) 01/30/2017  . Abscess of right leg   . Infection of below knee amputation stump ([place]) 01/22/2017  . Below knee amputation status, right 01/06/2017  . Diabetic wet gangrene of the foot ([place])   . Diabetic foot infection ([place]) 12/13/2016  . HTN (hypertension) 12/13/2016  . Severe sepsis with septic shock ([place]) 12/13/2016  . Hypokalemia 12/13/2016  . Chronic osteomyelitis involving ankle and foot ([place]) 06/03/2012  . Syncope and collapse 04/12/2012  . Tobacco abuse 04/12/2012  . Cellulitis 02/25/2012  . Diabetic toe ulcer ([place]) 02/25/2012  . Type 2 diabetes mellitus with complication, with long-term current use of insulin ([place]) 02/25/2012  . Leukocytosis 02/25/2012   Past Medical History:  Diagnosis Date  . Anemia   . Arthritis   . Diabetes mellitus    Type II  . GERD (gastroesophageal reflux disease)   . Neuropathy   . Peripheral neuropathy   . Pneumonia   . Prostatitis     Family History  Problem Relation Age of Onset  . Cancer Mother   . Congestive Heart Failure Father     Past Surgical History:  Procedure Laterality Date  . AMPUTATION  05/31/2012   Procedure: AMPUTATION  DIGIT;  Surgeon: Newt Minion, MD;  Location: [place];  Service: Orthopedics;  Laterality: Right;  right fourth and fifth toe ray amputation   . AMPUTATION Right 12/14/2016   Procedure: AMPUTATION BELOW KNEE RIGHT;  Surgeon: Newt Minion, MD;  Location: [place];  Service: Orthopedics;  Laterality: Right;  . AMPUTATION Right 01/31/2017   Procedure: Right Above Knee Amputation;  Surgeon: Newt Minion, MD;  Location: [place];  Service: Orthopedics;  Laterality: Right;  . APPLICATION OF WOUND VAC Right 02/05/2017   Procedure: APPLICATION OF WOUND VAC;  Surgeon: Newt Minion, MD;  Location: [place];  Service: Orthopedics;  Laterality: Right;  . I&D EXTREMITY  08/02/2012   Procedure: IRRIGATION  AND DEBRIDEMENT EXTREMITY;  Surgeon: Nadara Mustard, MD;  Location: [facility] OR;  Service: Orthopedics;  Laterality: Right;  Right Foot Irrigation and Debridement, Place Antibiotic Beads  . I&D EXTREMITY Right 02/16/2017   Procedure: Debridement Right Hip Disarticulation, Placement of Infusional Wound VAC;  Surgeon: Nadara Mustard, MD;  Location: [facility] OR;  Service: Orthopedics;  Laterality: Right;  . INCISION AND DRAINAGE HIP Right 02/20/2017   Procedure: IRRIGATION AND DEBRIDEMENT RIGHT HIP;  Surgeon: Nadara Mustard, MD;  Location: [facility] OR;  Service: Orthopedics;  Laterality: Right;  . INCISION AND DRAINAGE HIP Right 02/23/2017   Procedure: REPEAT IRRIGATION AND DEBRIDEMENT RIGHT HIP WITH WOUND CLOSURE AND INCISIONAL VAC PLACEMENT;  Surgeon: Nadara Mustard, MD;  Location: [facility] OR;  Service: Orthopedics;  Laterality: Right;  . SHOULDER SURGERY Bilateral    rotarcuff  . STUMP REVISION Left 07/12/2016   Procedure: Revision Left Below Knee Amputation;  Surgeon: Nadara Mustard, MD;  Location: [facility] OR;  Service: Orthopedics;  Laterality: Left;  . STUMP REVISION Right 01/24/2017   Procedure: Revision Right Below Knee Amputation;  Surgeon: Nadara Mustard, MD;  Location: [facility] OR;  Service: Orthopedics;  Laterality: Right;  . STUMP REVISION Right 02/05/2017    Procedure: REVISION RIGHT AKA;  Surgeon: Nadara Mustard, MD;  Location: [facility] OR;  Service: Orthopedics;  Laterality: Right;  . STUMP REVISION Right 02/07/2017   Procedure: RIGHT HIP DISARTICULATION;  Surgeon: Nadara Mustard, MD;  Location: [facility] OR;  Service: Orthopedics;  Laterality: Right;  . TOE AMPUTATION     multiple toes amputated.   Social History   Occupational History  . Not on file  Tobacco Use  . Smoking status: Current Every Day Smoker    Packs/day: 2.00    Years: 24.00    Pack years: 48.00    Types: Cigarettes  . Smokeless tobacco: Never Used  Substance and Sexual Activity  . Alcohol use: No  . Drug use: No  . Sexual activity: Yes    Birth control/protection: None

## 2019-09-16 ENCOUNTER — Telehealth: Payer: Self-pay | Admitting: Orthopedic Surgery

## 2019-09-16 NOTE — Telephone Encounter (Signed)
09/11/19 face to face faxed to [facility] [phone]

## 2019-09-24 ENCOUNTER — Telehealth: Payer: Self-pay | Admitting: Orthopedic Surgery

## 2019-09-24 NOTE — Telephone Encounter (Signed)
Received voicemail message from [place] with [employer] asking if the letter of medical necessary and written order were received. The information was fax 09/16/1999. The number to contact Jinny Blossom is [phone] Ext: [phone]

## 2019-09-24 NOTE — Telephone Encounter (Signed)
Derrick [name] was called and informed per Dr Sharol Given has filled out and updated patient's office note for face-to face encounter. NMS has been faxed today on 09/24/2019 with all information needed, signed and dated.

## 2020-02-16 DEATH — deceased
